# Patient Record
Sex: Male | Born: 1940 | Race: Black or African American | Hispanic: No | Smoking: Never smoker
Health system: Southern US, Community
[De-identification: ages and names within clinical notes are randomized; demographics above are authoritative.]

## PROBLEM LIST (undated history)

## (undated) DIAGNOSIS — C61 Malignant neoplasm of prostate: Secondary | ICD-10-CM

## (undated) DIAGNOSIS — Z923 Personal history of irradiation: Secondary | ICD-10-CM

## (undated) DIAGNOSIS — E669 Obesity, unspecified: Secondary | ICD-10-CM

---

## 1993-09-20 DIAGNOSIS — C61 Malignant neoplasm of prostate: Secondary | ICD-10-CM

## 1993-09-20 HISTORY — DX: Malignant neoplasm of prostate: C61

## 2009-02-18 ENCOUNTER — Emergency Department (HOSPITAL_COMMUNITY): Admission: EM | Admit: 2009-02-18 | Discharge: 2009-02-18 | Payer: Self-pay | Admitting: Emergency Medicine

## 2010-12-28 LAB — URINALYSIS, ROUTINE W REFLEX MICROSCOPIC
Bilirubin Urine: NEGATIVE
Ketones, ur: NEGATIVE mg/dL
Nitrite: NEGATIVE
Protein, ur: NEGATIVE mg/dL
Urobilinogen, UA: 1 mg/dL (ref 0.0–1.0)

## 2010-12-28 LAB — POCT I-STAT, CHEM 8
Calcium, Ion: 1.11 mmol/L — ABNORMAL LOW (ref 1.12–1.32)
Chloride: 106 mEq/L (ref 96–112)
Glucose, Bld: 126 mg/dL — ABNORMAL HIGH (ref 70–99)
HCT: 41 % (ref 39.0–52.0)

## 2011-04-30 ENCOUNTER — Inpatient Hospital Stay (INDEPENDENT_AMBULATORY_CARE_PROVIDER_SITE_OTHER)
Admission: RE | Admit: 2011-04-30 | Discharge: 2011-04-30 | Disposition: A | Discharge status: Home / Self Care | Payer: Medicare HMO | Source: Ambulatory Visit | Attending: Family Medicine | Admitting: Family Medicine

## 2011-04-30 DIAGNOSIS — N400 Enlarged prostate without lower urinary tract symptoms: Secondary | ICD-10-CM

## 2011-04-30 DIAGNOSIS — R319 Hematuria, unspecified: Secondary | ICD-10-CM

## 2011-04-30 LAB — POCT URINALYSIS DIP (DEVICE)
Protein, ur: >=300 mg/dL — AB
Specific Gravity, Urine: 1.02 (ref 1.005–1.030)
Urobilinogen, UA: 1 mg/dL (ref 0.0–1.0)

## 2011-04-30 LAB — PSA: PSA: 13.84 ng/mL — ABNORMAL HIGH (ref <=4.00)

## 2011-06-22 ENCOUNTER — Emergency Department (HOSPITAL_COMMUNITY)
Admission: EM | Admit: 2011-06-22 | Discharge: 2011-06-23 | Disposition: A | Discharge status: Home / Self Care | Payer: Medicare HMO | Attending: Emergency Medicine | Admitting: Emergency Medicine

## 2011-06-22 DIAGNOSIS — R339 Retention of urine, unspecified: Secondary | ICD-10-CM | POA: Insufficient documentation

## 2011-06-22 DIAGNOSIS — R109 Unspecified abdominal pain: Secondary | ICD-10-CM | POA: Insufficient documentation

## 2011-06-22 DIAGNOSIS — R300 Dysuria: Secondary | ICD-10-CM | POA: Insufficient documentation

## 2011-06-22 DIAGNOSIS — R319 Hematuria, unspecified: Secondary | ICD-10-CM | POA: Insufficient documentation

## 2011-06-22 LAB — DIFFERENTIAL
Lymphocytes Relative: 33 % (ref 12–46)
Lymphs Abs: 3 10*3/uL (ref 0.7–4.0)
Neutro Abs: 5.2 10*3/uL (ref 1.7–7.7)
Neutrophils Relative %: 57 % (ref 43–77)

## 2011-06-22 LAB — POCT I-STAT, CHEM 8
Chloride: 104 mEq/L (ref 96–112)
HCT: 37 % — ABNORMAL LOW (ref 39.0–52.0)
Potassium: 4 mEq/L (ref 3.5–5.1)
Sodium: 138 mEq/L (ref 135–145)

## 2011-06-22 LAB — URINALYSIS, ROUTINE W REFLEX MICROSCOPIC
Glucose, UA: 250 mg/dL — AB
Ketones, ur: >80 mg/dL — AB
Nitrite: POSITIVE — AB
pH: 5 (ref 5.0–8.0)

## 2011-06-22 LAB — CBC
HCT: 33.4 % — ABNORMAL LOW (ref 39.0–52.0)
MCV: 64.2 fL — ABNORMAL LOW (ref 78.0–100.0)
RBC: 5.2 MIL/uL (ref 4.22–5.81)
WBC: 9.2 10*3/uL (ref 4.0–10.5)

## 2011-06-22 LAB — URINE MICROSCOPIC-ADD ON

## 2011-06-23 ENCOUNTER — Emergency Department (HOSPITAL_COMMUNITY)
Admission: EM | Admit: 2011-06-23 | Discharge: 2011-06-24 | Disposition: A | Discharge status: Home / Self Care | Payer: Medicare HMO | Attending: Emergency Medicine | Admitting: Emergency Medicine

## 2011-06-23 DIAGNOSIS — R42 Dizziness and giddiness: Secondary | ICD-10-CM | POA: Insufficient documentation

## 2011-06-23 DIAGNOSIS — R300 Dysuria: Secondary | ICD-10-CM | POA: Insufficient documentation

## 2011-06-23 DIAGNOSIS — R319 Hematuria, unspecified: Secondary | ICD-10-CM | POA: Insufficient documentation

## 2011-06-23 LAB — CBC
HCT: 30.4 % — ABNORMAL LOW (ref 39.0–52.0)
Hemoglobin: 10.4 g/dL — ABNORMAL LOW (ref 13.0–17.0)
MCH: 21.8 pg — ABNORMAL LOW (ref 26.0–34.0)
MCHC: 34.2 g/dL (ref 30.0–36.0)
RBC: 4.78 MIL/uL (ref 4.22–5.81)

## 2011-06-25 NOTE — Consult Note (Signed)
NAMEMarland Kitchen  MERCURY, ROCK NO.:  [id number]  MEDICAL RECORD NO.:  [id number]  LOCATION:  [facility]                         FACILITY:  [facility]  PHYSICIAN:  Natalia Leatherwood, MD    DATE OF BIRTH:  09-Oct-1940  DATE OF CONSULTATION: 06/23/2011 DATE OF DISCHARGE:                                CONSULTATION   REQUESTING PROVIDER:  [facility]  CHIEF COMPLAINT:  Gross hematuria.  HISTORY OF PRESENT ILLNESS:  This is a 70 year old gentleman who is a patient of Dr. Ihor Gully with [facility].  The patient has a history of gross hematuria and a very large prostate.  The large prostate is the source of his hematuria.  He also has history of low- grade prostate cancer.  He had never had treatment for the prostate cancer.  He presented to the emergency department overnight with clot retention.  The patient had a 16-French two-way Foley catheter placed and hand irrigated multiple times, but was unable to clear the gross hematuria.  So, Urology was consulted.  I saw the patient and evaluated him.  I exchanged his 16-French catheter for 24-French three-way hematuria catheter with a Coude tip.  This was done using sterile technique.  I irrigated his bladder by hand with 3 liters of sterile water.  I placed 30 mL of sterile water into the balloon and placed it on traction while irrigating.  I was able to irrigate his bladder to be free of clot.  Then, I started continuous bladder irrigation and ran several liters through this.  His urine became completely clear with no trace of gross hematuria.  I was able to turn off the continuous bladder irrigation and it remained light pink color.  PAST MEDICAL HISTORY: 1. Prostate cancer. 2. Gross hematuria.  The patient denies any other medical problems.  MEDICATIONS:  Tamsulosin daily.  ALLERGIES:  No known drug allergies.  SURGERIES:  The patient denies previous surgeries.  REVIEW OF SYSTEMS:  Negative for  chest pain, shortness of breath, nausea, vomiting, dizziness or lightheadedness.  Further 10-point review of systems is negative, except for what is listed in history present illness.  SOCIAL HISTORY:  The patient denies daily alcohol use.  Denies recreational drug use.  FAMILY HISTORY:  Noncontributory.  PHYSICAL EXAMINATION:  GENERAL:  No acute distress.  Awake. ENT:  Normocephalic.  Atraumatic. NECK:  Supple.  No masses. CHEST:  Equal effort bilaterally.  Clear to auscultation bilaterally. CARDIOVASCULAR:  S1 is present.  S2 is present.  Regular rhythm. ABDOMEN:  Soft.  Nontender to palpation.  No guarding. BACK:  No CVA tenderness on the right.  No CVA tenderness on the left. GENITOURINARY:  The patient is uncircumcised.  Foreskin is easily retracted and reduced. SKIN:  Good turgor.  No apparent rash. EXTREMITIES:  No gross deformities of the bilateral upper extremities. No gross deformities of the bilateral lower extremities.  LABORATORY DATA:  The patient has a hemoglobin of 11.  Other laboratories reviewed.  Creatinine is within normal limits.  ASSESSMENT:  This patient with gross hematuria.  PLAN:  The patient has cleared his hematuria. 1. I would recommend continuing Foley catheter placement and  discharged home with his Foley catheter in place. 2. He can follow up with Dr. Ihor Gully  at [facility].  I     will contact Dr. Vernie Ammons to let him know and he can decide if want     to make an appointment. 3. I discussed with the patient starting finasteride 5 mg daily.  We     discussed the risks and benefits of this medication, including     breast tenderness, gynecomastia, and decreased libido.  We also     discussed the results of the PCPT trial and the REDUCE trial.  The     patient voiced understanding and wishes to proceed.          ______________________________ Natalia Leatherwood, MD     DW/MEDQ  D:  06/23/2011  T:  06/23/2011  Job:   [id number]  Electronically Signed by Natalia Leatherwood MD on 06/25/2011 09:06:15 PM

## 2014-04-19 ENCOUNTER — Inpatient Hospital Stay: Admit: 2014-04-19 | Discharge: 2014-04-19 | Disposition: A | Discharge status: Home / Self Care

## 2014-04-19 LAB — CBC WITH AUTO DIFFERENTIAL
Basophils %: 1 % (ref 0–2)
Basophils Absolute: 0.03 E9/L (ref 0.00–0.20)
Eosinophils %: 2 % (ref 0–6)
Eosinophils Absolute: 0.08 E9/L (ref 0.05–0.50)
Hematocrit: 42.6 % (ref 37.0–54.0)
Hemoglobin: 13.3 g/dL (ref 12.5–16.5)
Lymphocytes %: 39 % (ref 20–42)
Lymphocytes Absolute: 1.96 E9/L (ref 1.50–4.00)
MCH: 21.4 pg — ABNORMAL LOW (ref 26.0–35.0)
MCHC: 31.2 % — ABNORMAL LOW (ref 32.0–34.5)
MCV: 68.5 fL — ABNORMAL LOW (ref 80.0–99.9)
MPV: 8.6 fL (ref 7.0–12.0)
Monocytes %: 11 % (ref 2–12)
Monocytes Absolute: 0.56 E9/L (ref 0.10–0.95)
Neutrophils %: 48 % (ref 43–80)
Neutrophils Absolute: 2.4 E9/L (ref 1.80–7.30)
PLATELET SLIDE REVIEW: NORMAL
Platelets: 171 E9/L (ref 130–450)
RBC: 6.22 E12/L — ABNORMAL HIGH (ref 3.80–5.80)
RDW: 17.1 fL — ABNORMAL HIGH (ref 11.5–15.0)
WBC: 5 E9/L (ref 4.5–11.5)

## 2014-04-19 LAB — URINALYSIS
Bilirubin Urine: NEGATIVE
Glucose, Ur: NEGATIVE mg/dL
Ketones, Urine: NEGATIVE mg/dL
Leukocyte Esterase, Urine: NEGATIVE
Nitrite, Urine: NEGATIVE
Protein, UA: 100 mg/dL — AB
Specific Gravity, UA: 1.025 (ref 1.005–1.030)
Urobilinogen, Urine: 1 E.U./dL (ref <2.0)
pH, UA: 6 (ref 5.0–9.0)

## 2014-04-19 LAB — MICROSCOPIC URINALYSIS: RBC, UA: >20 /HPF (ref 0–2)

## 2014-04-19 LAB — BASIC METABOLIC PANEL
Anion Gap: 8 mmol/L (ref 7–16)
BUN: 9 mg/dL (ref 8–23)
CO2: 28 mmol/L (ref 22–29)
Calcium: 8.6 mg/dL (ref 8.6–10.2)
Chloride: 104 mmol/L (ref 98–107)
Creatinine: 1.1 mg/dL (ref 0.7–1.2)
GFR African American: >60
GFR Non-African American: >60 mL/min/{1.73_m2} (ref >=60)
Glucose: 113 mg/dL — ABNORMAL HIGH (ref 74–109)
Potassium: 6.2 mmol/L — ABNORMAL HIGH (ref 3.5–5.0)
Sodium: 140 mmol/L (ref 132–146)

## 2014-04-19 LAB — POTASSIUM: Potassium: 4.7 mmol/L (ref 3.5–5.0)

## 2014-04-19 NOTE — ED Notes (Signed)
Pt states he noticed dark red blood in his urine - blood clots - no trauma or injury     Rahmah Mccamy, RN  04/19/14 1432

## 2014-04-19 NOTE — ED Provider Notes (Signed)
HPI Comments: Patient seen independently by Debbora LacrosseMark Eaven Schwager PA-C.    This is a 73 year old male that presents to emergency department complaining of blood in his urine. He states this has been going on for the last several days. He states passing some clots. He denies any pain at this time. He states about 20 years ago he was diagnosed with prostate cancer and states that he refused any treatment at the time. He states for years he has had no problems. [place].      Review of Systems   Constitutional: Negative for fever and chills.   HENT: Negative for ear pain, sinus pressure and sore throat.    Eyes: Negative for pain, discharge and redness.   Respiratory: Negative for cough, shortness of breath and wheezing.    Cardiovascular: Negative for chest pain.   Gastrointestinal: Negative for nausea, vomiting, abdominal pain and diarrhea.   Genitourinary: Positive for hematuria. Negative for dysuria and frequency.   Musculoskeletal: Negative for back pain and arthralgias.   Skin: Negative for rash and wound.   Neurological: Negative for weakness and headaches.   Hematological: Negative for adenopathy.   All other systems reviewed and are negative.      Physical Exam   Constitutional: He is oriented to person, place, and time. He appears well-developed and well-nourished.   HENT:   Head: Normocephalic and atraumatic.   Right Ear: Hearing and external ear normal.   Left Ear: Hearing and external ear normal.   Nose: Nose normal. Right sinus exhibits no maxillary sinus tenderness and no frontal sinus tenderness. Left sinus exhibits no maxillary sinus tenderness and no frontal sinus tenderness.   Mouth/Throat: Uvula is midline, oropharynx is clear and moist and mucous membranes are normal. No trismus in the jaw. No uvula swelling.   Eyes: Conjunctivae, EOM and lids are normal. Pupils are equal, round, and reactive to light.   Neck: Normal range of motion. Neck supple.   Cardiovascular: Normal  rate, regular rhythm and normal heart sounds.    No murmur heard.  Pulmonary/Chest: Effort normal and breath sounds normal.   Abdominal: Soft. Bowel sounds are normal. There is no tenderness. There is no rigidity, no rebound, no guarding and no CVA tenderness.   Musculoskeletal: He exhibits no edema.   Neurological: He is alert and oriented to person, place, and time. He has normal strength. No cranial nerve deficit or sensory deficit. Coordination and gait normal. GCS eye subscore is 4. GCS verbal subscore is 5. GCS motor subscore is 6.   Skin: Skin is warm and dry. No abrasion and no rash noted.   Nursing note and vitals reviewed.      Procedures    MDM    --------------------------------------------- PAST HISTORY ---------------------------------------------  Past Medical History:  has a past medical history of Cancer (HCC).    Past Surgical History:  has no past surgical history on file.    Social History:  reports that he has never smoked. He does not have any smokeless tobacco history on file. He reports that he drinks alcohol. He reports that he does not use illicit drugs.    Family History: family history is not on file.     The patient???s home medications have been reviewed.    Allergies: Review of patient's allergies indicates no known allergies.    -------------------------------------------------- RESULTS -------------------------------------------------  Results for orders placed during the hospital encounter of 04/19/14   URINALYSIS  Result Value Ref Range    Color, UA RED (*) Straw/Yellow    Clarity, UA CLOUDY (*) Clear    Glucose, Ur Negative  Negative mg/dL    Bilirubin Urine Negative  Negative    Ketones, Urine Negative  Negative mg/dL    Specific Gravity, UA 1.025  1.005 - 1.030    Blood, Urine LARGE (*) Negative    pH, UA 6.0  5.0 - 9.0    Protein, UA 100 (*) Negative mg/dL    Urobilinogen, Urine 1.0  < 2.0 E.U./dL    Nitrite, Urine Negative  Negative    Leukocyte Esterase, Urine Negative   Negative   CBC WITH AUTO DIFFERENTIAL       Result Value Ref Range    WBC 5.0  4.5 - 11.5 E9/L    RBC 6.22 (*) 3.80 - 5.80 E12/L    Hemoglobin 13.3  12.5 - 16.5 g/dL    Hematocrit 16.1  09.6 - 54.0 %    MCV 68.5 (*) 80.0 - 99.9 fL    MCH 21.4 (*) 26.0 - 35.0 pg    MCHC 31.2 (*) 32.0 - 34.5 %    RDW 17.1 (*) 11.5 - 15.0 fL    Platelets 171  130 - 450 E9/L    MPV 8.6  7.0 - 12.0 fL    PLATELET SLIDE REVIEW Normal      Neutrophils Relative 48  43 - 80 %    Lymphocytes Relative 39  20 - 42 %    Monocytes Relative 11  2 - 12 %    Eosinophils Relative Percent 2  0 - 6 %    Basophils Relative 1  0 - 2 %    Neutrophils Absolute 2.40  1.80 - 7.30 E9/L    Lymphocytes Absolute 1.96  1.50 - 4.00 E9/L    Monocytes Absolute 0.56  0.10 - 0.95 E9/L    Eosinophils Absolute 0.08  0.05 - 0.50 E9/L    Basophils Absolute 0.03  0.00 - 0.20 E9/L    Anisocytosis 1+      Hypochromia 1+     BASIC METABOLIC PANEL       Result Value Ref Range    Sodium 140  132 - 146 mmol/L    Potassium 6.2 (*) 3.5 - 5.0 mmol/L    Chloride 104  98 - 107 mmol/L    CO2 28  22 - 29 mmol/L    Anion Gap 8  7 - 16 mmol/L    Glucose 113 (*) 74 - 109 mg/dL    BUN 9  8 - 23 mg/dL    CREATININE 1.1  0.7 - 1.2 mg/dL    GFR Non-African American >60  >=60 mL/min/1.73    GFR African American >60      Calcium 8.6  8.6 - 10.2 mg/dL   MICROSCOPIC URINALYSIS       Result Value Ref Range    WBC, UA NONE  0 - 5 /HPF    RBC, UA >20  0 - 2 /HPF    Epi Cells NONE      Bacteria, UA MODERATE (*)    POTASSIUM       Result Value Ref Range    Potassium 4.7  3.5 - 5.0 mmol/L     CT ABDOMEN PELVIS WO IV CONTRAST    Final Result: IMPRESSION: Significant enlarged prostate with indeterminate     urinary bladder invasion. Intermediate density material within the    urinary bladder  probably is blood given patient's symptoms.       ------------------------- NURSING NOTES AND VITALS REVIEWED ---------------------------   The nursing notes within the ED encounter and vital signs as below have been  reviewed.   BP 164/82    Pulse 61    Temp(Src) 97.7 ??F (36.5 ??C) (Oral)    Resp 18    Ht 5\' 9"  (1.753 m)    Wt 305 lb (138.347 kg)    BMI 45.02 kg/m2      SpO2 96%   Oxygen Saturation Interpretation: Normal      ------------------------------------------ PROGRESS NOTES ------------------------------------------   I have spoken with the patient and discussed today???s results, in addition to providing specific details for the plan of care and counseling regarding the diagnosis and prognosis.  Their questions are answered at this time and they are agreeable with the plan.      --------------------------------- ADDITIONAL PROVIDER NOTES ---------------------------------     I spoke to patient about test results. I spoke to him about the CAT scan test in particular.  Patient states he is going back to [place] in the next several days. I offered to call a local urologist here for him but patient states he is leaving in a few days. He states he is going to call a family Dr. Claiborne Billingshat he is seen in the past in order to get this problem worked up. I told him that it is a high probability that the prostate problem could be cancerous and he states that he will get this checked out once he goes back to [place]. I told him to return the next few days he's having any problems especially with urination.     This patient is stable for discharge.  I have shared the specific conditions for return, as well as the importance of follow-up.      * NOTE: This report was transcribed using voice recognition software. Every effort was made to ensure accuracy; however, inadvertent computerized transcription errors may be present.    --------------------------------- IMPRESSION AND DISPOSITION ---------------------------------    IMPRESSION  1. Hematuria    2. Prostate hypertrophy        DISPOSITION  Disposition: Discharge to home  Patient condition is good          Sherryl BartersMark A Elex Mainwaring, [place], RN  04/19/14 2223

## 2014-04-21 LAB — CULTURE, URINE

## 2014-04-22 ENCOUNTER — Inpatient Hospital Stay
Admit: 2014-04-23 | Disposition: A | Discharge status: Home / Self Care | Source: Home / Self Care | Attending: Emergency Medicine | Admitting: Emergency Medicine

## 2014-04-22 MED ADMIN — 0.9 % sodium chloride bolus: 1000 mL | INTRAVENOUS | NDC 00338004904

## 2014-04-22 NOTE — ED Notes (Signed)
Post void residual 332cc.     Carole Civil, RN  04/22/14 2119

## 2014-04-22 NOTE — ED Notes (Signed)
Alert, oriented x3 resp easy.  Voided 50cc bright red blood.  Urine spec sent to lab.     Carole Civil, RN  04/22/14 2001

## 2014-04-22 NOTE — ED Provider Notes (Signed)
HPI:  04/22/2014,   Time: 7:21 PM         Billy Charles is a 73 y.o. male presenting to the ED for bladder and lower abdominal pain, beginning few days ago.  The complaint has been persistent, moderate in severity, and worsened by urinating.  Patient presents to the emergency room with lower suprapubic pain and spasms. He is also having persistent bloody urine With clots. He was seen here on July 31 with similar symptoms. The patient's problems began 10 years ago when he was diagnosed with prostate cancer. At the time he refused treatment. He came up to the area last week for a family funeral. About mid week last week he started noting some blood in his urine. It got worse and he came to the emergency room to be seen on Friday. At the time his hemoglobin and hematocrit were within normal limits. A CAT scan was done which showed prostate cancer that appeared to have invaded into the bladder wall. The patient at the time was headed back to [place] and was discharged home and advised to follow up in the very near future with a urologist upon his return home. He has not left the area yet because of these persistent symptoms. He comes back to the emergency room reporting that his pain got very severe over the weekend. He describes suprapubic spasms and severe pain it's worse with urinating. The blood and blood clots persist.  He denies any fever or chills or vomiting.  Denies weakness or lightheadedness      ROS:     HEENT:  No HA, ear pain, sore throat or difficulty swallowing  Resp:  Denies SOB, cough or hemoptysis  Cardiac: No CP, LE edema or palpitations  GI:  See HPI  GU:  See HPI  MS:  Moving arms and legs well.  No injury or trauma  Neuro: Denies head injury, headache, syncope or seizure.    Skin:  No rashes or skin lesions      Unless otherwise stated in this report or unable to obtain because of the patient's clinical or mental status as evidenced by the medical record, this patients's positive and  negative responses for Review of Systems, constitutional, psych, eyes, ENT, cardiovascular, respiratory, gastrointestinal, neurological, genitourinary, musculoskeletal, integument systems and systems related to the presenting problem are either stated in the preceding or were not pertinent or were negative for the symptoms and/or complaints related to the medical problem.      --------------------------------------------- PAST HISTORY ---------------------------------------------Past Medical History:  has a past medical history of Cancer (HCC).    Past Surgical History:  has no past surgical history on file.    Social History:  reports that he has never smoked. He does not have any smokeless tobacco history on file. He reports that he drinks alcohol. He reports that he does not use illicit drugs.    Family History: family history is not on file.     The patient???s home medications have been reviewed.    Allergies: Review of patient's allergies indicates no known allergies.    -------------------------------------------------- RESULTS -------------------------------------------------  All laboratory and radiology results have been personally reviewed by myself   LABS:  Results for orders placed during the hospital encounter of 04/22/14   CBC WITH AUTO DIFFERENTIAL       Result Value Ref Range    WBC 6.3  4.5 - 11.5 E9/L    RBC 4.92  3.80 - 5.80 E12/L  Hemoglobin 10.6 (*) 12.5 - 16.5 g/dL    Hematocrit 16.1 (*) 37.0 - 54.0 %    MCV 68.7 (*) 80.0 - 99.9 fL    MCH 21.6 (*) 26.0 - 35.0 pg    MCHC 31.4 (*) 32.0 - 34.5 %    RDW 17.6 (*) 11.5 - 15.0 fL    Platelets 167  130 - 450 E9/L    MPV 8.8  7.0 - 12.0 fL    PLATELET SLIDE REVIEW Normal      Neutrophils Relative 48  43 - 80 %    Lymphocytes Relative 39  20 - 42 %    Monocytes Relative 12  2 - 12 %    Eosinophils Relative Percent 1  0 - 6 %    Basophils Relative 1  0 - 2 %    Neutrophils Absolute 2.98  1.80 - 7.30 E9/L    Lymphocytes Absolute 2.41  1.50 - 4.00 E9/L     Monocytes Absolute 0.77  0.10 - 0.95 E9/L    Eosinophils Absolute 0.05  0.05 - 0.50 E9/L    Basophils Absolute 0.04  0.00 - 0.20 E9/L    Anisocytosis 1+      Hypochromia 2+      Poikilocytes 1+      Target Cells 1+     COMPREHENSIVE METABOLIC PANEL       Result Value Ref Range    Sodium 138  132 - 146 mmol/L    Potassium 5.0  3.5 - 5.0 mmol/L    Chloride 101  98 - 107 mmol/L    CO2 28  22 - 29 mmol/L    Anion Gap 9  7 - 16 mmol/L    Glucose 107  74 - 109 mg/dL    BUN 11  8 - 23 mg/dL    CREATININE 1.2  0.7 - 1.2 mg/dL    GFR Non-African American 59  >=60 mL/min/1.73    GFR African American >60      Calcium 8.6  8.6 - 10.2 mg/dL    Total Protein 6.5  6.4 - 8.3 g/dL    Alb 3.3 (*) 3.5 - 5.2 g/dL    Total Bilirubin 0.6  0.0 - 1.2 mg/dL    Alkaline Phosphatase 37 (*) 40 - 129 U/L    ALT 12  0 - 40 U/L    AST 29  0 - 39 U/L   PROTIME-INR       Result Value Ref Range    Protime 12.7 (*) 9.3 - 12.6 sec    INR 1.0     APTT       Result Value Ref Range    aPTT 26.8  24.5 - 34.6 sec   LACTIC ACID, PLASMA       Result Value Ref Range    Lactic Acid 1.4  0.5 - 2.2 mmol/L   URINALYSIS       Result Value Ref Range    Color, UA RED (*) Straw/Yellow    Clarity, UA TURBID (*) Clear    Glucose, Ur Negative  Negative mg/dL    Bilirubin Urine Negative  Negative    Ketones, Urine Negative  Negative mg/dL    Specific Gravity, UA 1.015  1.005 - 1.030    Blood, Urine LARGE (*) Negative    pH, UA 7.0  5.0 - 9.0    Protein, UA >=300 (*) Negative mg/dL    Urobilinogen, Urine 1.0  < 2.0 E.U./dL  Nitrite, Urine POSITIVE (*) Negative    Leukocyte Esterase, Urine TRACE (*) Negative   MICROSCOPIC URINALYSIS       Result Value Ref Range    WBC, UA 10-20 (*) 0 - 5 /HPF    RBC, UA PACKED  0 - 2 /HPF    Epi Cells RARE      Bacteria, UA RARE (*)        RADIOLOGY:  Interpreted by Radiologist.       ------------------------- NURSING NOTES AND VITALS REVIEWED ---------------------------   The nursing notes within the ED encounter and vital signs as  below have been reviewed.   BP 133/71    Pulse 62    Temp(Src) 97.9 ??F (36.6 ??C) (Oral)    Resp 20    Ht 5\' 9"  (1.753 m)    Wt 305 lb (138.347 kg)    BMI 45.02 kg/m2      SpO2 96%   Oxygen Saturation Interpretation: Normal      ---------------------------------------------------PHYSICAL EXAM--------------------------------------      Constitutional/General: Alert and oriented x3, well appearing, non toxic in NAD  Head: NC/AT  Eyes: PERRL, EOMI  Mouth: Oropharynx clear, handling secretions, no trismus  Neck: Supple, full ROM, no meningeal signs  Pulmonary: Lungs clear to auscultation bilaterally, no wheezes, rales, or rhonchi. Not in respiratory distress  Cardiovascular:  Regular rate and rhythm, no murmurs, gallops, or rubs. 2+ distal pulses  Abdomen: Soft, + BS.  No distension.  Mild suprapubic tenderness.  No palpable rigidity, rebound or guarding  Extremities: Moves all extremities x 4. Warm and well perfused  Skin: warm and dry without rash  Neurologic: GCS 15,  Intact.  No focal deficits  Psych: Normal Affect      ------------------------------ ED COURSE/MEDICAL DECISION MAKING----------------------  Medications   cefTRIAXone (ROCEPHIN) 1 g IVPB in 50 mL D5W minibag (1 g Intravenous New Bag 04/22/14 2108)   0.9 % sodium chloride bolus (1,000 mLs Intravenous New Bag 04/22/14 1953)   morphine injection 5 mg (5 mg Intravenous Given 04/22/14 2106)         Medical Decision Making:    The patient's H&H is down markedly over the past 3 days. He continues to have profuse hematuria with blood clots and now is having some increased bladder pain and spasms. Checking a postvoid residual. He does have positive large blood and some nitrates and leukocytes in his urine. We'll give him a gram of Rocephin here. I discussed the case with Dr. Mean. He liked a PSA level. Patient will be placed on an nothing by mouth status. He'll see the patient in the morning and decide if he needs any procedures. Dr. Luan Pullingunlap is going to be the  admitting physician and is aware and agreeable as well.    Pt has 300 cc PVR but is voiding routinely and showing no signs of obstruction.  He is comfortable at this time.   Hold foley at this time.  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  Discussed with Dr. Magnus IvanSvoboda.      Counseling:   The emergency provider has spoken with the patient and discussed today???s results, in addition to providing specific details for the plan of care and counseling regarding the diagnosis and prognosis.  Questions are answered at this time and they are agreeable with the plan.      --------------------------------- IMPRESSION AND DISPOSITION ---------------------------------    IMPRESSION  1. Hematuria    2. Anemia    3. Prostate  [facility])        DISPOSITION  Disposition: Admit to med/surg floor  Patient condition is stable                      Eartha Inch, PA-C  04/22/14 2044    Eartha Inch, PA-C  04/22/14 2135

## 2014-04-22 NOTE — H&P (Signed)
Name: Billy Charles  DOB: Feb 03, 1941  MRN: [id number]  Room: 25/25  DOS: 04/22/2014  PCP: No primary provider on file.  Admitting Physician: No admitting provider for patient encounter.        Internal Medicine Resident History & Physical    Chief Complaint:  Hematuria    History of Present Illness:    Patient is a 73 year old male who presents with lower abdominal pain and hematuria beginning about 3 days ago. Patient was seen in the ED for similar symptoms at that time. CAT scan done then revealed significantly enlarged prostate with possible bladder invasion. Patient states since then, symptoms have been worsening and he has been having increasing pain. He states he continues to pass blood in his urine and states is old blood and clots. He states he is having increase in frequency. He denies any recent significant weight loss. He states he was diagnosed prostate cancer roughly 20 years ago, which at that time he did not seek treatment. Otherwise, no fever or chills, chest pain, shortness of breath.    Labs:  Lab Results   Component Value Date    WBC 6.3 04/22/2014    HGB 10.6* 04/22/2014    HCT 33.8* 04/22/2014    PLT 167 04/22/2014    NA 138 04/22/2014    K 5.0 04/22/2014    CL 101 04/22/2014    CREATININE 1.2 04/22/2014    BUN 11 04/22/2014    CO2 28 04/22/2014    GLUCOSE 107 04/22/2014    ALT 12 04/22/2014    AST 29 04/22/2014    INR 1.0 04/22/2014     No results found for this basename: CKTOTAL, CKMB, CKMBINDEX, TROPONINI     No results for input(s): BNP in the last 72 hours.      Radiology:  Ct Abdomen Pelvis Wo Iv Contrast    04/19/2014   Reading location: 200   Dictation: Pain, hematuria, history prostate cancer   Findings: Axial noncontrast images were obtained through the abdomen and pelvis per stone protocol.  Due to lack of oral contrast, suboptimal evaluation of the bowel.  Due to lack of IV contrast, suboptimal evaluation of the blood vessels and solid organs.   Symmetric renal size. Renal collecting systems and ureters and urinary  bladder normal caliber. No urinary calculus. Significantly enlarged prostate measuring at least is 65 mm in axial dimensions exhibiting indistinct interface with the urinary bladder. Intermediate density material within the urinary bladder.   Normal sized heart. Small hiatus hernia. Few linear bands of atelectasis or fibrosis in the lung bases.   Within limits of the noncontrast study, other abdominal solid organs unremarkable. Normal caliber bowel and bile ducts. Small umbilical hernia containing mesenteric fat. No abnormally enlarged lymph nodes or evidence of osseous metastasis.      04/19/2014   IMPRESSION: Significant enlarged prostate with indeterminate urinary bladder invasion. Intermediate density material within the urinary bladder probably is blood given patient's symptoms.      Review of Systems:  All bolded are positive, all others are negative.  General:  Fever, chills, diaphoresis, fatigue, malaise, night sweats, weight loss  Psychological:  Anxiety, disorientation, hallucinations.  ENT:  Epistaxis, headaches, vertigo, visual changes.  Cardiovascular:  Chest pain, irregular heartbeats, palpitations, paroxysmal nocturnal dyspnea.  Respiratory:  Shortness of breath, coughing, sputum production, hemoptysis, wheezing, orthopnea.  Gastrointestinal:  Nausea, vomiting, diarrhea, heartburn, constipation, abdominal pain, hematemesis, hematochezia, melena, acholic stools  Genito-Urinary:  Dysuria, urgency, frequency, hematuria  Musculoskeletal:  Joint pain,  joint stiffness, joint swelling, muscle pain  Neurology:  Headache, focal neurological deficits, weakness, numbness, paresthesia  Derm:  Rashes, ulcers, excoriations, bruising  Extremities:  Decreased ROM, peripheral edema, mottling    Past Medical Hx:  Past Medical History   Diagnosis Date   ??? Cancer ([facility])      prostrate-1995       Past Surgical Hx:  History reviewed. No pertinent past surgical history.    Family Hx:  History reviewed. No pertinent family  history.     Home Medications:  Prior to Admission medications    Not on File       Allergies:  Review of patient's allergies indicates no known allergies.    Social Hx:  History     Social History   ??? Marital Status: Single     Spouse Name: N/A     Number of Children: N/A   ??? Years of Education: N/A     Occupational History   ??? Not on file.     Social History Main Topics   ??? Smoking status: Never Smoker    ??? Smokeless tobacco: Not on file   ??? Alcohol Use: Yes      Comment: occ   ??? Drug Use: No   ??? Sexual Activity: Not on file     Other Topics Concern   ??? Not on file     Social History Narrative       Vitals:  BP 133/71    Pulse 62    Temp(Src) 97.9 ??F (36.6 ??C) (Oral)    Resp 20    Ht 5' 9"$  (1.753 m)    Wt 305 lb (138.347 kg)    BMI 45.02 kg/m2      SpO2 96%     Physical Exam:  General: Awake, alert, oriented to person, place, time, and purpose, appears stated age, cooperative, no acute distress   Head: Normocephalic, atraumatic  Eyes: Conjunctivae/corneas clear, Sclera non icteric  Mouth: Mucous membranes moist  Neck: No JVD, no adenopathy, no carotid bruit, neck is supple, trachea is midline  Back: ROM normal, No CVA tenderness.  Lungs: Clear to auscultation bilaterally.  No retractions or use of accessory muscles.  No vocal fremitus. No rhonchi, crackle or rale.  Heart: Regular rate and regular rhythm, no murmur, normal S1, S2  Abdomen: Soft, mild lower abdominal tenderness; bowel sounds normal; no masses, no organomegaly  Extremities: No lower extremity edema, extremities atraumatic, no cyanosis, no clubbing, 2+ pedal pulses palpated  Skin: Normal color, normal texture, normal turgor, no rashes, no lesions  Neurologic: 5/5 muscle strength throughout, Normal muscle tone throughout, PERRLA, EOMI, face symmetric, equal facial muscle strength bilaterally, hearing intact, uvula midline, tongue midline, Speech appropriate without slurring.  Osteopathic/Structural Exam: Examined in seated and supine position. Normal  thoracic kyphosis. Normal lumbar lordosis. No acute changes.      Assessment and Plan     Patient is a 73 y.o. male who presented with hematuria.     Assessment  1. Hematuria with history of prostate cancer.  2. Urinary tract infection    Plan  Urology has been consulted. Patient is to be taken to the OR tomorrow. We'll obtain a post void residual. Pending this result We may place a Foley. Check a chest x-ray to rule out any pathology. Will make patient nothing by mouth. Check routine labs. Please see orders.    This patient was discussed with Dr. Constance Goltz.    Colin Mulders, DO, PGY2  9:12 PM  04/22/2014    The chart was reviewed and the patient was seen and examined.The case was discussed in detail with the resident and agree with current impressions and plan.    Joelyn Oms, D.O.  9:20 AM  04/23/2014

## 2014-04-23 DIAGNOSIS — R319 Hematuria, unspecified: Secondary | ICD-10-CM

## 2014-04-23 DIAGNOSIS — E669 Obesity, unspecified: Secondary | ICD-10-CM | POA: Insufficient documentation

## 2014-04-23 DIAGNOSIS — C7951 Secondary malignant neoplasm of bone: Secondary | ICD-10-CM

## 2014-04-23 DIAGNOSIS — C61 Malignant neoplasm of prostate: Secondary | ICD-10-CM | POA: Insufficient documentation

## 2014-04-23 LAB — CBC WITH AUTO DIFFERENTIAL
Basophils %: 1 % (ref 0–2)
Basophils %: 0 % (ref 0–2)
Basophils Absolute: 0.04 E9/L (ref 0.00–0.20)
Basophils Absolute: 0.02 E9/L (ref 0.00–0.20)
Eosinophils %: 1 % (ref 0–6)
Eosinophils %: 1 % (ref 0–6)
Eosinophils Absolute: 0.05 E9/L (ref 0.05–0.50)
Eosinophils Absolute: 0.08 E9/L (ref 0.05–0.50)
Hematocrit: 33.8 % — ABNORMAL LOW (ref 37.0–54.0)
Hematocrit: 31.3 % — ABNORMAL LOW (ref 37.0–54.0)
Hemoglobin: 10.6 g/dL — ABNORMAL LOW (ref 12.5–16.5)
Hemoglobin: 9.7 g/dL — ABNORMAL LOW (ref 12.5–16.5)
Lymphocytes %: 39 % (ref 20–42)
Lymphocytes %: 42 % (ref 20–42)
Lymphocytes Absolute: 2.41 E9/L (ref 1.50–4.00)
Lymphocytes Absolute: 2.6 E9/L (ref 1.50–4.00)
MCH: 21.6 pg — ABNORMAL LOW (ref 26.0–35.0)
MCH: 21.3 pg — ABNORMAL LOW (ref 26.0–35.0)
MCHC: 31.4 % — ABNORMAL LOW (ref 32.0–34.5)
MCHC: 31 % — ABNORMAL LOW (ref 32.0–34.5)
MCV: 68.7 fL — ABNORMAL LOW (ref 80.0–99.9)
MCV: 68.7 fL — ABNORMAL LOW (ref 80.0–99.9)
MPV: 8.8 fL (ref 7.0–12.0)
MPV: 8.7 fL (ref 7.0–12.0)
Monocytes %: 12 % (ref 2–12)
Monocytes %: 14 % — ABNORMAL HIGH (ref 2–12)
Monocytes Absolute: 0.77 E9/L (ref 0.10–0.95)
Monocytes Absolute: 0.84 E9/L (ref 0.10–0.95)
Neutrophils %: 48 % (ref 43–80)
Neutrophils %: 43 % (ref 43–80)
Neutrophils Absolute: 2.98 E9/L (ref 1.80–7.30)
Neutrophils Absolute: 2.62 E9/L (ref 1.80–7.30)
PLATELET SLIDE REVIEW: NORMAL
PLATELET SLIDE REVIEW: NORMAL
Platelets: 167 E9/L (ref 130–450)
Platelets: 152 E9/L (ref 130–450)
RBC: 4.92 E12/L (ref 3.80–5.80)
RBC: 4.55 E12/L (ref 3.80–5.80)
RDW: 17.6 fL — ABNORMAL HIGH (ref 11.5–15.0)
RDW: 17.2 fL — ABNORMAL HIGH (ref 11.5–15.0)
WBC: 6.3 E9/L (ref 4.5–11.5)
WBC: 6.2 E9/L (ref 4.5–11.5)

## 2014-04-23 LAB — URINALYSIS
Bilirubin Urine: NEGATIVE
Glucose, Ur: NEGATIVE mg/dL
Ketones, Urine: NEGATIVE mg/dL
Nitrite, Urine: POSITIVE — AB
Protein, UA: >=300 mg/dL — AB
Specific Gravity, UA: 1.015 (ref 1.005–1.030)
Urobilinogen, Urine: 1 E.U./dL (ref <2.0)
pH, UA: 7 (ref 5.0–9.0)

## 2014-04-23 LAB — APTT
aPTT: 26.8 s (ref 24.5–34.6)
aPTT: 26.9 s (ref 24.5–34.6)

## 2014-04-23 LAB — PROTIME-INR
INR: 1
INR: 1
Protime: 12.6 s (ref 9.3–12.6)
Protime: 12.7 s — ABNORMAL HIGH (ref 9.3–12.6)

## 2014-04-23 LAB — COMPREHENSIVE METABOLIC PANEL
ALT: 12 U/L (ref 0–40)
ALT: 9 U/L (ref 0–40)
AST: 13 U/L (ref 0–39)
AST: 29 U/L (ref 0–39)
Albumin: 3 g/dL — ABNORMAL LOW (ref 3.5–5.2)
Albumin: 3.3 g/dL — ABNORMAL LOW (ref 3.5–5.2)
Alkaline Phosphatase: 37 U/L — ABNORMAL LOW (ref 40–129)
Alkaline Phosphatase: 40 U/L (ref 40–129)
Anion Gap: 9 mmol/L (ref 7–16)
Anion Gap: 9 mmol/L (ref 7–16)
BUN: 10 mg/dL (ref 8–23)
BUN: 11 mg/dL (ref 8–23)
CO2: 25 mmol/L (ref 22–29)
CO2: 28 mmol/L (ref 22–29)
Calcium: 8.3 mg/dL — ABNORMAL LOW (ref 8.6–10.2)
Calcium: 8.6 mg/dL (ref 8.6–10.2)
Chloride: 101 mmol/L (ref 98–107)
Chloride: 105 mmol/L (ref 98–107)
Creatinine: 1.1 mg/dL (ref 0.7–1.2)
Creatinine: 1.2 mg/dL (ref 0.7–1.2)
GFR African American: >60
GFR African American: >60
GFR Non-African American: 59 mL/min/{1.73_m2} (ref >=60)
GFR Non-African American: >60 mL/min/{1.73_m2} (ref >=60)
Glucose: 102 mg/dL (ref 74–109)
Glucose: 107 mg/dL (ref 74–109)
Potassium: 3.7 mmol/L (ref 3.5–5.0)
Potassium: 5 mmol/L (ref 3.5–5.0)
Sodium: 138 mmol/L (ref 132–146)
Sodium: 139 mmol/L (ref 132–146)
Total Bilirubin: 0.3 mg/dL (ref 0.0–1.2)
Total Bilirubin: 0.6 mg/dL (ref 0.0–1.2)
Total Protein: 5.9 g/dL — ABNORMAL LOW (ref 6.4–8.3)
Total Protein: 6.5 g/dL (ref 6.4–8.3)

## 2014-04-23 LAB — MICROSCOPIC URINALYSIS

## 2014-04-23 LAB — LACTIC ACID: Lactic Acid: 1.4 mmol/L (ref 0.5–2.2)

## 2014-04-23 MED ORDER — ACETAMINOPHEN 325 MG PO TABS
325 | ORAL | Status: DC | PRN
Start: 2014-04-23 — End: 2014-04-24

## 2014-04-23 MED ORDER — HYDROCODONE-ACETAMINOPHEN 5-325 MG PO TABS
5-325 | Freq: Four times a day (QID) | ORAL | Status: DC | PRN
Start: 2014-04-23 — End: 2014-04-24

## 2014-04-23 MED ORDER — ACETAMINOPHEN 325 MG PO TABS
325 | ORAL | Status: DC | PRN
Start: 2014-04-23 — End: 2014-04-22

## 2014-04-23 MED ORDER — OXYCODONE-ACETAMINOPHEN 5-325 MG PO TABS
5-325 | ORAL | Status: DC | PRN
Start: 2014-04-23 — End: 2014-04-24
  Administered 2014-04-23: 22:00:00 1 via ORAL

## 2014-04-23 MED ADMIN — Iothalamate Meglumine (CYSTO-CONRAY II) 17.2 % injection 250 mL: 250 mL | @ 19:00:00 | NDC 00019086209

## 2014-04-23 MED ADMIN — lactated ringers infusion: INTRAVENOUS | @ 22:00:00 | NDC 00338011704

## 2014-04-23 MED ADMIN — sodium chloride flush 0.9 % injection 10 mL: 10 mL | INTRAVENOUS | @ 16:00:00

## 2014-04-23 MED ADMIN — morphine injection 5 mg: 5 mg | INTRAVENOUS | @ 01:00:00 | NDC 00641607301

## 2014-04-23 MED ADMIN — cefTRIAXone (ROCEPHIN) 1 g IVPB in 50 mL D5W minibag: 1 g | INTRAVENOUS | @ 01:00:00 | NDC 25021010610

## 2014-04-23 MED FILL — FENTANYL CITRATE 0.05 MG/ML IJ SOLN: 0.05 MG/ML | INTRAMUSCULAR | Qty: 5

## 2014-04-23 MED FILL — CEFTRIAXONE SODIUM 1 G IJ SOLR: 1 g | INTRAMUSCULAR | Qty: 1

## 2014-04-23 MED FILL — DIPRIVAN 10 MG/ML IV EMUL: 10 MG/ML | INTRAVENOUS | Qty: 20

## 2014-04-23 MED FILL — MORPHINE SULFATE 5 MG/ML IJ SOLN: 5 mg/mL | INTRAMUSCULAR | Qty: 1

## 2014-04-23 MED FILL — NORMAL SALINE FLUSH 0.9 % IV SOLN: 0.9 % | INTRAVENOUS | Qty: 10

## 2014-04-23 MED FILL — OXYCODONE-ACETAMINOPHEN 5-325 MG PO TABS: 5-325 MG | ORAL | Qty: 1

## 2014-04-23 MED FILL — SOJOURN IN SOLN: RESPIRATORY_TRACT | Qty: 250

## 2014-04-23 NOTE — Op Note (Signed)
KOBEY, SIDES                                   [id number]      DATE OF PROCEDURE:  04/23/2014      SURGEON:  Jacki Cones, M.D.      ASSISTANT:      PREOPERATIVE DIAGNOSIS:  Hematuria.      POSTOPERATIVE DIAGNOSIS:  Hematuria secondary to prostatic fossa bleeding.      OPERATION:  Cystopanendoscopy, evacuation of clots, fulguration of prostatic   bleeding.      ANESTHESIA:  General.      ESTIMATED BLOOD LOSS:      COMPLICATIONS:      PROCEDURE:   With the patient in the lithotomy position, under satisfactory   general anesthesia, the genitalia were prepped with Betadine and draped in a   sterile fashion.      A 23-French panendoscope passed easily through the pendulous and membranous   urethra.  There were no strictures or lesions seen.  The prostatic fossa   showed large lateral lobes; however, the median lobe was extremely elongated   and I could barely get to the top of the median lobe which extended well into   the bladder and was probably the size of a baseball.  The median lobe   occupied almost the entire bladder.  Once I was able to get alongside the   median lobe, I was able to aspirate clots.  I also did a cystogram which   verified the capacity of the bladder as well as the large median lobe.  I   then reinserted the resectoscope and cauterized as well as possible.  The   patient's median lobe borders on not being of a resectable nature.  It more   than likely will require either robotic or an open procedure.  The patient is   from [place] here on vacation, and we will discuss various options   with him.  He may prefer to travel back to [place] with the Foley in   place.            Dictated by:  Bryan Lemma Markeem Noreen, M.D.               Macarthur Critchley / nmt/psk   DD: 04/23/2014   03:35 P    DT: 04/23/2014 07:56 P   [id number]    [id number]   CC:  Farrel Gobble, D.O.        Jacki Cones, M.D.

## 2014-04-23 NOTE — Progress Notes (Signed)
[facility] patient is sleeping but awakens easily

## 2014-04-23 NOTE — Consults (Signed)
CONSULT DICTATED          Bryan Lemmaobert R. Burnis Kaser, M.D.  04/23/2014  11:51 AM CONSULT DICTATED          Bryan Lemmaobert R. Juvon Teater, M.D.  04/23/2014  11:51 AM

## 2014-04-23 NOTE — Anesthesia Post-Procedure Evaluation (Signed)
Department of Anesthesiology  Post-Anesthesia Note    Name:  Billy DownerJohn Nilsson                                         Age:  73 y.o.  MRN:  [id number]     Last Vitals:  BP 130/66   Pulse 66   Temp(Src) 97.7 F (36.5 C) (Oral)   Resp 18   Ht 5\' 9"  (1.753 m)   Wt 305 lb (138.347 kg)   BMI 45.02 kg/m2     SpO2 96%   Patient Vitals for the past 4 hrs:   BP Temp Temp src Pulse Resp SpO2   04/23/14 1622 130/66 mmHg 97.7 F (36.5 C) Oral 66 18 96 %   04/23/14 1610 148/73 mmHg - - - - 96 %   04/23/14 1600 138/78 mmHg - - 66 12 98 %   04/23/14 1555 - 97.7 F (36.5 C) Temporal - - 97 %   04/23/14 1550 144/75 mmHg - - 76 13 96 %   04/23/14 1540 147/80 mmHg - - 84 20 97 %   04/23/14 1530 147/80 mmHg 98.4 F (36.9 C) Temporal 85 20 100 %       Level of Consciousness:  Awake    Respiratory:  Stable    Oxygen Saturation:  Stable    Cardiovascular:  Stable    Hydration:  Adequate    PONV:  Stable    Post-op Pain:  Adequate analgesia    Post-op Assessment:  No apparent anesthetic complications    Additional Follow-Up / Treatment / Comment:  None    Myriam ForehandVictor C Tabatha Razzano, MD  April 23, 2014   5:44 PM

## 2014-04-23 NOTE — Consults (Signed)
Billy DownerRICE, Sherard                                   [id number]      DATE OF CONSULTATION:  04/23/2014      CONSULTANT:  Jacki ConesOBERT R. Lian Tanori, M.D.      CHIEF COMPLAINT:  Hematuria and difficulty voiding.      HISTORY OF PRESENT ILLNESS:  This 73 year old male who, in about 1995, was   diagnosed with prostate cancer.  The patient elected to not received formal   treatment.  [place] and has not been seen   since.  The patient is in the area now, visiting, and began having gross   hematuria with clots and some difficulty voiding.  He was admitted to the   hospital.  A CAT scan showed that he has significant prostate enlargement and   some suggestion of bladder invasion.  The patient is otherwise in good   general health.      PAST MEDICAL HISTORY:  His past medical history is essentially unremarkable   with the exception of obesity.      FAMILY HISTORY:  Family history is negative for any genitourinary   malignancies.      MEDICATIONS:  His medications currently are Rocephin, Zofran.      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  He has never been a cigarette smoker.  [place]  He is married.      PHYSICAL EXAMINATION:  The patient is an alert, oriented, very large male who   does not really appear to be in discomfort.  Head, ears, eyes, nose, and   throat unremarkable.  His abdomen is soft.  There is some tenderness in the   suprapubic area.  Penis, testes are normal.      ASSESSMENT:   1.    Gross hematuria with clots.   2.    History of prostate cancer.  PSA is pending at this time.         RECOMMENDATIONS:  The patient will undergo cystoscopy, clot evacuation and   fulguration of his bleeding.            Dictated by:  Bryan Lemmaobert R. Nyal Schachter, M.D.      Macarthur CritchleyR / nmt/psk   DD:  04/23/2014   11:55 A    DT:  04/23/2014 04:13 P   [id number]    [id number]   CC:   Farrel GobbleUGLAS A. DUNLAP, D.O.         Jacki ConesOBERT R. Jannah Guardiola, M.D.

## 2014-04-23 NOTE — H&P (Addendum)
Name: Billy Charles  DOB: December 02, 1940  MRN: [id number]  Room: 0314/0314-01  DOS: 04/23/2014  PCP: No primary provider on file.  Admitting Physician: Farrel Gobble, DO        Internal Medicine Resident History & Physical    Chief Complaint:  Hematuria    History of Present Illness:    Patient is a 73 year old male who presents with lower abdominal pain and hematuria beginning about 3 days ago. Patient was seen in the ED for similar symptoms at that time. CAT scan done then revealed significantly enlarged prostate with possible bladder invasion. Patient states since then, symptoms have been worsening and he has been having increasing pain. He states he continues to pass blood in his urine and states is old blood and clots. He states he is having increase in frequency. He denies any recent significant weight loss. He states he was diagnosed prostate cancer roughly 20 years ago, which at that time he did not seek treatment. Otherwise, no fever or chills, chest pain, shortness of breath.    Labs:  Lab Results   Component Value Date    WBC 6.2 04/23/2014    HGB 9.7* 04/23/2014    HCT 31.3* 04/23/2014    PLT 152 04/23/2014    NA 139 04/23/2014    K 3.7 04/23/2014    CL 105 04/23/2014    CREATININE 1.1 04/23/2014    BUN 10 04/23/2014    CO2 25 04/23/2014    GLUCOSE 102 04/23/2014    ALT 9 04/23/2014    AST 13 04/23/2014    INR 1.0 04/23/2014     No results found for this basename: CKTOTAL,  CKMB,  CKMBINDEX,  TROPONINI     No results for input(s): BNP in the last 72 hours.      Radiology:  Ct Abdomen Pelvis Wo Iv Contrast    04/19/2014   Reading location: 200   Dictation: Pain, hematuria, history prostate cancer   Findings: Axial noncontrast images were obtained through the abdomen and pelvis per stone protocol.  Due to lack of oral contrast, suboptimal evaluation of the bowel.  Due to lack of IV contrast, suboptimal evaluation of the blood vessels and solid organs.   Symmetric renal size. Renal collecting systems and ureters and urinary bladder normal  caliber. No urinary calculus. Significantly enlarged prostate measuring at least is 65 mm in axial dimensions exhibiting indistinct interface with the urinary bladder. Intermediate density material within the urinary bladder.   Normal sized heart. Small hiatus hernia. Few linear bands of atelectasis or fibrosis in the lung bases.   Within limits of the noncontrast study, other abdominal solid organs unremarkable. Normal caliber bowel and bile ducts. Small umbilical hernia containing mesenteric fat. No abnormally enlarged lymph nodes or evidence of osseous metastasis.      04/19/2014   IMPRESSION: Significant enlarged prostate with indeterminate urinary bladder invasion. Intermediate density material within the urinary bladder probably is blood given patient's symptoms.      Review of Systems:  All bolded are positive, all others are negative.  General:  Fever, chills, diaphoresis, fatigue, malaise, night sweats, weight loss  Psychological:  Anxiety, disorientation, hallucinations.  ENT:  Epistaxis, headaches, vertigo, visual changes.  Cardiovascular:  Chest pain, irregular heartbeats, palpitations, paroxysmal nocturnal dyspnea.  Respiratory:  Shortness of breath, coughing, sputum production, hemoptysis, wheezing, orthopnea.  Gastrointestinal:  Nausea, vomiting, diarrhea, heartburn, constipation, abdominal pain, hematemesis, hematochezia, melena, acholic stools  Genito-Urinary:  Dysuria, urgency, frequency, hematuria  Musculoskeletal:  Joint  pain, joint stiffness, joint swelling, muscle pain  Neurology:  Headache, focal neurological deficits, weakness, numbness, paresthesia  Derm:  Rashes, ulcers, excoriations, bruising  Extremities:  Decreased ROM, peripheral edema, mottling    Past Medical Hx:  Past Medical History   Diagnosis Date   ??? Cancer ([facility])      prostrate-1995       Past Surgical Hx:  History reviewed. No pertinent past surgical history.    Family Hx:  History reviewed. No pertinent family history.     Home  Medications:  Prior to Admission medications    Not on File       Allergies:  Review of patient's allergies indicates no known allergies.    Social Hx:  History     Social History   ??? Marital Status: Single     Spouse Name: N/A     Number of Children: N/A   ??? Years of Education: N/A     Occupational History   ??? Not on file.     Social History Main Topics   ??? Smoking status: Never Smoker    ??? Smokeless tobacco: Not on file   ??? Alcohol Use: Yes      Comment: occ   ??? Drug Use: No   ??? Sexual Activity: Not on file     Other Topics Concern   ??? Not on file     Social History Narrative       Vitals:  BP 142/66    Pulse 68    Temp(Src) 97.9 ??F (36.6 ??C) (Oral)    Resp 16    Ht 5\' 9"  (1.753 m)    Wt 305 lb (138.347 kg)    BMI 45.02 kg/m2      SpO2 98%     Physical Exam:  General: Awake, alert, oriented to person, place, time, and purpose, appears stated age, cooperative, no acute distress   Head: Normocephalic, atraumatic  Eyes: Conjunctivae/corneas clear, Sclera non icteric  Mouth: Mucous membranes moist  Neck: No JVD, no adenopathy, no carotid bruit, neck is supple, trachea is midline  Back: ROM normal, No CVA tenderness.  Lungs: Clear to auscultation bilaterally.  No retractions or use of accessory muscles.  No vocal fremitus. No rhonchi, crackle or rale.  Heart: Regular rate and regular rhythm, no murmur, normal S1, S2  Abdomen: Soft, mild lower abdominal tenderness; bowel sounds normal; no masses, no organomegaly  Extremities: No lower extremity edema, extremities atraumatic, no cyanosis, no clubbing, 2+ pedal pulses palpated  Skin: Normal color, normal texture, normal turgor, no rashes, no lesions  Neurologic: 5/5 muscle strength throughout, Normal muscle tone throughout, PERRLA, EOMI, face symmetric, equal facial muscle strength bilaterally, hearing intact, uvula midline, tongue midline, Speech appropriate without slurring.  Osteopathic/Structural Exam: Examined in seated and supine position. Normal thoracic kyphosis.  Normal lumbar lordosis. No acute changes.      Assessment and Plan     Patient is a 73 y.o. male who presented with hematuria.     Assessment  1. Hematuria with history of prostate cancer.  2. Urinary tract infection  3. Obesity  4. Hematuria  5. ? New onset HTN    Plan  Urology has been consulted. Patient is to be taken to the OR tomorrow. We'll obtain a post void residual. Pending this result We may place a Foley. Check a chest x-ray to rule out any pathology. Will make patient nothing by mouth. Check routine labs. Please see orders.    This patient  was discussed with Dr. Luan Pulling.    Charlesetta Garibaldi, DO, PGY2  9:21 AM  04/23/2014    The chart was reviewed and the patient was seen and examined.The case was discussed in detail with the resident and agree with current impressions and plan. Pt is set up for cystoscopy today. No new complaints. P.E.  Cor-rrr,;  Lung-ess cta,;  abd- + bs,soft, no mass,;  Ext-no pte.    Farrel Gobble, D.O.  9:21 AM  04/23/2014

## 2014-04-23 NOTE — Plan of Care (Signed)
Problem: Anxiety:  Goal: Level of anxiety will decrease  Level of anxiety will decrease  Outcome: Ongoing

## 2014-04-23 NOTE — Brief Op Note (Signed)
Brief Postoperative Note    Billy Charles  Date of Birth:  1941/05/11  [id number]    Pre-operative Diagnosis: hematuria    Post-operative Diagnosis: Same    Procedure: cysto evac clots fulguration    Anesthesia: General    Surgeons/Assistants: Bryan Lemma Naythen Heikkila      Estimated Blood Loss: 50    Complications: None    Specimens: Was Not Obtained    Findings:     Electronically signed by Jacki Cones, MD on 04/23/2014 at 3:22 PM

## 2014-04-23 NOTE — Plan of Care (Signed)
Problem: Pain:  Goal: Control of acute pain  Control of acute pain  Outcome: Ongoing

## 2014-04-23 NOTE — Anesthesia Pre-Procedure Evaluation (Signed)
Billy Charles         Department of Anesthesiology  Pre-Anesthesia Evaluation/Consultation       Name:  Billy Charles                                         Age:  73 y.o.  MRN:  [id number]           Procedure (Scheduled):  Cysto, evacuation clots, possible fulguration  Surgeon:  Dr. Gus Height     No Known Allergies  Patient Active Problem List   Diagnosis Code   . Prostate ca (HCC) 185   . Hematuria 599.70   . Obesity, unspecified 278.00     Past Medical History   Diagnosis Date   . 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 No pertinent past surgical history.  History   Substance Use Topics   . Smoking status: Never Smoker    . Smokeless tobacco: Not on file   . Alcohol Use: Yes      Comment: occ     Medications  No current facility-administered medications on file prior to encounter.     No current outpatient prescriptions on file prior to encounter.     Current Facility-Administered Medications   Medication Dose Route Frequency Provider Last Rate Last Dose   . [MAR Hold] HYDROcodone-acetaminophen (NORCO) 5-325 MG per tablet 1 tablet  1 tablet Oral Q6H PRN Farrel Gobble, DO       . [MAR Hold] sodium chloride flush 0.9 % injection 10 mL  10 mL Intravenous PRN Charlesetta Garibaldi, DO       . [MAR Hold] acetaminophen (TYLENOL) tablet 650 mg  650 mg Oral Q4H PRN Charlesetta Garibaldi, DO       . Kei.Heading Hold] magnesium hydroxide (MILK OF MAGNESIA) 400 MG/5ML suspension 30 mL  30 mL Oral Daily PRN Charlesetta Garibaldi, DO       . [MAR Hold] ondansetron (ZOFRAN) injection 4 mg  4 mg Intravenous Q6H PRN Charlesetta Garibaldi, DO       . Kei.Heading Hold] cefTRIAXone (ROCEPHIN) 1 g IVPB in 50 mL D5W minibag  1 g Intravenous Q24H Charlesetta Garibaldi, DO       . [MAR Hold] sodium chloride flush 0.9 % injection 10 mL  10 mL Intravenous Q12H SCH Cindy A Lanterman, PA-C   10 mL at 04/23/14 1136     Vital Signs (Current)   Filed Vitals:    04/23/14 0755   BP: 142/66   Pulse: 68   Temp: 97.9 F (36.6 C)   Resp: 16     Vital Signs Statistics (for past 48 hrs)     BP  Min: 133/71    Min taken time: 04/22/14 1958  Max: 151/75   Max taken time: 04/22/14 2229  Temp  Avg: 98.2 F (36.8 C)  Min: 97.9 F (36.6 C)   Min taken time: 04/23/14 0755  Max: 98.7 F (37.1 C)   Max taken time: 04/22/14 2229  Pulse  Avg: 68.6  Min: 62   Min taken time: 04/22/14 1958  Max: 78   Max taken time: 04/22/14 2229  Resp  Avg: 19.6  Min: 16   Min taken time: 04/23/14 0755  Max: 22   Max taken time: 04/22/14 1645  SpO2  Avg: 96.8 %  Min: 94 %   Min taken time:  04/22/14 2229  Max: 98 %   Max taken time: 04/23/14 0740    BP Readings from Last 3 Encounters:   04/23/14 142/66   04/19/14 164/82     BMI  Body mass index is 45.02 kg/(m^2).  Estimated body mass index is 45.02 kg/(m^2) as calculated from the following:    Height as of this encounter: 5\' 9"  (1.753 m).    Weight as of this encounter: 305 lb (138.347 kg).    CBC   Lab Results   Component Value Date    WBC 6.2 04/23/2014    RBC 4.55 04/23/2014    HGB 9.7 04/23/2014    HCT 31.3 04/23/2014    MCV 68.7 04/23/2014    RDW 17.2 04/23/2014    PLT 152 04/23/2014     CMP    Lab Results   Component Value Date    NA 139 04/23/2014    K 3.7 04/23/2014    CL 105 04/23/2014    CO2 25 04/23/2014    BUN 10 04/23/2014    CREATININE 1.1 04/23/2014    GFRAA >60 04/23/2014    LABGLOM >60 04/23/2014    GLUCOSE 102 04/23/2014    PROT 5.9 04/23/2014    CALCIUM 8.3 04/23/2014    BILITOT 0.3 04/23/2014    ALKPHOS 40 04/23/2014    AST 13 04/23/2014    ALT 9 04/23/2014     BMP    Lab Results   Component Value Date    NA 139 04/23/2014    K 3.7 04/23/2014    CL 105 04/23/2014    CO2 25 04/23/2014    BUN 10 04/23/2014    CREATININE 1.1 04/23/2014    CALCIUM 8.3 04/23/2014    GFRAA >60 04/23/2014    LABGLOM >60 04/23/2014    GLUCOSE 102 04/23/2014     POCGlucose  Recent Labs      04/22/14   1950  04/23/14   0356   GLUCOSE  107  102      Coags    Lab Results   Component Value Date    PROTIME 12.6 04/23/2014    INR 1.0 04/23/2014    APTT 26.9 04/23/2014     HCG (If Applicable)   No results found for this basename: PREGTESTUR,  PREGSERUM,  HCG,  HCGQUANT       ABGs   No results found for this basename: PHART,  PO2ART,  PCO2ART,  HCO3ART,  BEART,  O2SATART      Type & Screen (If Applicable)  No results found for this basename: aborh     Radiology (If Applicable)  Cardiac Testing (If Applicable)   EKG (If Applicable)        Anesthesia Evaluation     Patient summary reviewed    Airway   Mallampati: III  TM distance: >3 FB  Dental        Pulmonary - negative ROS    breath sounds clear to auscultation  Cardiovascular   (-) dysrhythmias, angina    Rhythm: regular  Rate: normal  Beta Blocker:  Not on Beta Blocker    Neuro/Psych - negative ROS   GI/Hepatic/Renal      Comments: Prostate ca    hematuria    Endo/Other      Comments: obesity  Abdominal   (+) obese,        Other findings: Several chipped teeth           Allergies: Review of patient's allergies indicates  no known allergies.    NPO Status: Time of last liquid consumption: 0000                       Time of last solid food consumption: 0000            Date of last liquid consumption: 04/23/14            Date of last solid food consumption: 04/23/14    Anesthesia Plan    ASA 3     MAC   (LMAC, GA if necessary)  intravenous induction   Anesthetic plan and risks discussed with patient.    Plan discussed with CRNA.          Myriam ForehandVictor C Leahmarie Gasiorowski, MD  04/23/2014

## 2014-04-23 NOTE — Progress Notes (Signed)
Patient received folder, rights and responsibilities reviewed. Billy Charles  04/23/2014  6:17 PM

## 2014-04-23 NOTE — Progress Notes (Signed)
[facility] Quality Flow/Interdisciplinary Rounds Progress Note        Quality Flow Rounds held on April 23, 2014    Disciplines Attending:  Bedside Nurse, Social Worker, Case Manager and Nursing Unit Leadership    Billy Charles was admitted on 04/22/2014  8:43 PM    Anticipated Discharge Date:  Expected Discharge Date: 04/25/14    Disposition: home    Braden Score:  Braden Scale Score: 22    Readmission Risk           Readmission Risk:       2.25       Age 72 or Greater: 1    Admitted from SNF or Requires Paid or Family Care: 0    Currently has CHF,COPD,ARF,CRI,or is on dialysis: 0    Takes more than 5 Prescription Medications: 0    Takes Digoxin,Insulin,Anticoagulants,Narcotics or ASA/Plavix: 0    Hospital Admit in Past 12 Months: 0    On Disability: 0    Patient Considers own Health: 1.25          Discussed patient goal for the day, patient clinical progression, and barriers to discharge.  The following Goal(s) of the Day/Commitment(s) have been identified:  Cysto planned for today      [facility] Geraldy Charles  April 23, 2014

## 2014-04-24 LAB — BASIC METABOLIC PANEL
Anion Gap: 11 mmol/L (ref 7–16)
BUN: 7 mg/dL — ABNORMAL LOW (ref 8–23)
CO2: 24 mmol/L (ref 22–29)
Calcium: 8.2 mg/dL — ABNORMAL LOW (ref 8.6–10.2)
Chloride: 103 mmol/L (ref 98–107)
Creatinine: 1.1 mg/dL (ref 0.7–1.2)
GFR African American: >60
GFR Non-African American: >60 mL/min/{1.73_m2} (ref >=60)
Glucose: 111 mg/dL — ABNORMAL HIGH (ref 74–109)
Potassium: 4 mmol/L (ref 3.5–5.0)
Sodium: 138 mmol/L (ref 132–146)

## 2014-04-24 LAB — CBC
Hematocrit: 31.7 % — ABNORMAL LOW (ref 37.0–54.0)
Hemoglobin: 10 g/dL — ABNORMAL LOW (ref 12.5–16.5)
MCH: 21.7 pg — ABNORMAL LOW (ref 26.0–35.0)
MCHC: 31.5 % — ABNORMAL LOW (ref 32.0–34.5)
MCV: 68.9 fL — ABNORMAL LOW (ref 80.0–99.9)
MPV: 8 fL (ref 7.0–12.0)
Platelets: 154 E9/L (ref 130–450)
RBC: 4.6 E12/L (ref 3.80–5.80)
RDW: 17.1 fL — ABNORMAL HIGH (ref 11.5–15.0)
WBC: 8.8 E9/L (ref 4.5–11.5)

## 2014-04-24 MED ORDER — TAMSULOSIN HCL 0.4 MG PO CAPS
0.4 MG | ORAL_CAPSULE | Freq: Every day | ORAL | Status: AC
Start: 2014-04-24 — End: 2015-04-24

## 2014-04-24 MED ORDER — OXYCODONE-ACETAMINOPHEN 5-325 MG PO TABS
5-325 MG | ORAL_TABLET | ORAL | Status: AC | PRN
Start: 2014-04-24 — End: 2014-05-01

## 2014-04-24 MED ORDER — CEPHALEXIN 500 MG PO CAPS
500 MG | ORAL_CAPSULE | Freq: Three times a day (TID) | ORAL | Status: AC
Start: 2014-04-24 — End: 2014-05-01

## 2014-04-24 MED ADMIN — lactated ringers infusion: INTRAVENOUS | @ 12:00:00 | NDC 00338011704

## 2014-04-24 MED ADMIN — lactated ringers infusion: INTRAVENOUS | @ 04:00:00 | NDC 00338011704

## 2014-04-24 MED FILL — CEFTRIAXONE SODIUM 1 G IJ SOLR: 1 g | INTRAMUSCULAR | Qty: 1

## 2014-04-24 NOTE — Discharge Instructions (Addendum)
You may see blood in urine/you may have burning with urination  Call if severe abdominal pain or difficulty voiding  You may drive  5 days  You may bathe tomorrow  You may go up and down stairs  No lifting more than twenty pounds for a week  See a Urologist when you return home  Keep bowels soft with prune juice of Milk of Magnesia  Your information:  Name: Billy Charles  DOB: [birth year]    Your instructions:    As above per Dr Maryann Alar    What to do after you leave the hospital:    Recommended diet: regular diet    Recommended activity: activity as tolerated    If you experience any of the following symptoms inability to urinatr, signs of infection, fever, chills, please follow up with Primary care Dr   at home      The following personal items were collected during your admission and were returned to you:    Valuables  Dentures: None  Vision - Corrective Lenses: Glasses  Hearing Aid: None  Jewelry: None  Body Piercings Removed: N/A  Clothing: Pants, Shirt, Socks, Undergarments (Comment), Footwear  Were All Patient Medications Collected?: Not Applicable  Other Valuables: None  Valuables Given To: Patient  Provide Name(s) of Who Valuable(s) Were Given To: n/a  Responsible person(s) in the waiting room?: n/a    Information obtained by:  By signing below, I understand that if any problems occur once I leave the hospital I am to contact Primary care doctor   I understand and acknowledge receipt of the instructions indicated above.   Cystoscopy: What to Expect at [facility]     A cystoscopy is a procedure that lets a doctor look inside of the bladder and the urethra. The urethra is the tube that carries urine from the bladder to outside the body. The doctor uses a thin, lighted tube called a cystoscope to look for kidney or bladder stones, tumors, bleeding, or infection. After the cystoscopy, your urethra may be sore at first, and it may burn when you urinate for the first few days after the procedure. You may feel the  need to urinate more often, and your urine may be pink. These symptoms should get better in 1 or 2 days. You will probably be able to go back to work or most of your usual activities in 1 or 2 days.  This care sheet gives you a general idea about how long it will take for you to recover. But each person recovers at a different pace. Follow the steps below to get better as quickly as possible.  How can you care for yourself at home?  Activity   Rest when you feel tired. Getting enough sleep will help you recover.   Try to walk each day. Start by walking a little more than you did the day before. Bit by bit, increase the amount you walk. Walking boosts blood flow and helps prevent pneumonia and constipation.   Avoid strenuous activities, such as bicycle riding, jogging, weight lifting, or aerobic exercise, until your doctor says it is okay.   Ask your doctor when you can drive again.   Most people are able to return to work within 1 or 2 days after the procedure.   You may shower and take baths as usual.   Ask your doctor when it is okay for you to have sex.  Diet   You can eat your normal diet.  If your stomach is upset, try bland, low-fat foods like plain rice, broiled chicken, toast, and yogurt.   Drink plenty of fluids (unless your doctor tells you not to).  Medicines   Take pain medicines exactly as directed.   If the doctor gave you a prescription medicine for pain, take it as prescribed.   If you are not taking a prescription pain medicine, ask your doctor if you can take an over-the-counter medicine.   If you think your pain medicine is making you sick to your stomach:   Take your medicine after meals (unless your doctor has told you not to).   Ask your doctor for a different pain medicine.   If your doctor prescribed antibiotics, take them as directed. Do not stop taking them just because you feel better. You need to take the full course of antibiotics.  Follow-up care is a key part of your  treatment and safety. Be sure to make and go to all appointments, and call your doctor if you are having problems. It's also a good idea to know your test results and keep a list of the medicines you take.  When should you call for help?  Call 911 anytime you think you may need emergency care. For example, call if:   You passed out (lost consciousness).   You have severe trouble breathing.   You have sudden chest pain and shortness of breath, or you cough up blood.   You have severe belly pain.  Call your doctor now or seek immediate medical care if:   You are sick to your stomach or cannot keep fluids down.   Your urine is still red or you see blood clots after you have urinated several times.   You have trouble passing urine or stool, especially if you have pain or swelling in your lower belly.   You have signs of a blood clot, such as:   Pain in your calf, back of the knee, thigh, or groin.   Redness and swelling in your leg or groin.   You develop a fever or severe chills.   You have pain in your back just below your rib cage. This is called flank pain.  Watch closely for changes in your health, and be sure to contact your doctor if:   You have pain or burning when you urinate. A burning feeling is normal for a day or two after the test, but call if it does not get better.   You have a frequent urge to urinate but can pass only small amounts of urine.   Your urine is pink, red, or cloudy, or smells bad. It is normal for the urine to have a pinkish color for a few days after the test, but call if it does not get better.   Where can you learn more?   Go to [email or web address] and sign in to your MyChart account. Enter [phone] in the [place] box to learn more about "Cystoscopy: What to Expect at Home."    If you do not have an account, please click on the "Sign Up Now" link.      2006-2015 Healthwise, Inc[employer]. Care instructions adapted under license by [facility]. This care instruction is for use with your licensed healthcare professional. If you have questions about a medical condition or this instruction, always ask your healthcare professional. [place] any warranty or liability for your use of this information.  Content Version: 10.5.422740; Current as of:  May 29, 2013

## 2014-04-24 NOTE — Progress Notes (Signed)
Patient seen and examined. Pt is refusing surgery. Case discussed with Dr Maryann Alar this am. No complaints of unusual SOB, nausea, vomiting, chest pain,abd. pain, dizziness, diarrhea or constipation.    BP 105/68    Pulse 74    Temp(Src) 97.1 ??F (36.2 ??C) (Oral)    Resp 18    Ht 5' 9"$  (1.753 m)    Wt 305 lb (138.347 kg)    BMI 45.02 kg/m2      SpO2 95%     HEENT: negative to gross visual examination  Heart: regular rate and rhythm  Lungs: clear  Abdomen: soft, positive bowel sounds,markedly obese, no hepatosplenomegaly, no guarding, rebound, rigidity  Ext: no clubbing, cyanosis, erythema, edema  Neuro: alert and oriented. No gross deficits    CBC with Differential:    Lab Results   Component Value Date    WBC 8.8 04/24/2014    RBC 4.60 04/24/2014    HGB 10.0 04/24/2014    HCT 31.7 04/24/2014    PLT 154 04/24/2014    MCV 68.9 04/24/2014    MCH 21.7 04/24/2014    MCHC 31.5 04/24/2014    RDW 17.1 04/24/2014    LYMPHOPCT 42 04/23/2014    MONOPCT 14 04/23/2014    BASOPCT 0 04/23/2014    MONOSABS 0.84 04/23/2014    LYMPHSABS 2.60 04/23/2014    EOSABS 0.08 04/23/2014    BASOSABS 0.02 04/23/2014     CMP:    Lab Results   Component Value Date    NA 138 04/24/2014    K 4.0 04/24/2014    CL 103 04/24/2014    CO2 24 04/24/2014    BUN 7 04/24/2014    CREATININE 1.1 04/24/2014    GFRAA >60 04/24/2014    LABGLOM >60 04/24/2014    GLUCOSE 111 04/24/2014    PROT 5.9 04/23/2014    LABALBU 3.0 04/23/2014    CALCIUM 8.2 04/24/2014    BILITOT 0.3 04/23/2014    ALKPHOS 40 04/23/2014    AST 13 04/23/2014    ALT 9 04/23/2014     U/A:    Lab Results   Component Value Date    NITRU POSITIVE 04/22/2014    COLORU RED 04/22/2014    PHUR 7.0 04/22/2014    WBCUA 10-20 04/22/2014    RBCUA PACKED 04/22/2014    BACTERIA RARE 04/22/2014    CLARITYU TURBID 04/22/2014    SPECGRAV 1.015 04/22/2014    LEUKOCYTESUR TRACE 04/22/2014    UROBILINOGEN 1.0 04/22/2014    BILIRUBINUR Negative 04/22/2014    BLOODU LARGE 04/22/2014    GLUCOSEU Negative 04/22/2014    KETUA Negative 04/22/2014       No results for input(s): GLUMET in the last 72 hours.    Xr  Urogram W Wo Kub W Wo Tomogram    04/23/2014   Reading Location: 200    History: Hematuria   Comparison:  None   Technique: 18 images were obtained intraoperatively.    Findings: Images demonstrate filling of the bladder with no filling defects. Total fluoroscopy time was 0.28 minutes.      04/23/2014   IMPRESSION: Intraoperative images as above.       ??? cefTRIAXone (ROCEPHIN) IV  1 g Intravenous Q24H   ??? sodium chloride flush  10 mL Intravenous Q12H [facility]        Assessment;  Active Hospital Problems    Prostate ca [facility])      Hematuria      Obesity, unspecified  Plan:  Cont tx  Discharge today-pt is returning to his home in am  See orders.        Joelyn Oms, DO  11:29 AM  04/24/2014

## 2014-04-24 NOTE — Plan of Care (Signed)
Problem: Pain:  Goal: Pain level will decrease  Pain level will decrease   Outcome: Ongoing    Problem: Anxiety:  Goal: Ability to modify response to factors that promote anxiety will improve  Ability to modify response to factors that promote anxiety will improve   Outcome: Ongoing  Goal: Level of anxiety will decrease  Level of anxiety will decrease   Outcome: Ongoing

## 2014-04-24 NOTE — Progress Notes (Signed)
04/24/2014 10:18 AM  Service: Urology  Group: [facility] ([facility])    Billy Charles  [id number]    Subjective:  Pt anxious to get back to [facility]  Explained need for the catheter and the risk of recurrent clot retention but he wants to take his chances without foley  He has made  Arrangements to have f/u care in [place]    Review of Systems  Respiratory: negative  Cardiovascular: negative  Gastrointestinal: negative  Hematologic/lymphatic: negative  Musculoskeletal:negative  Neurological: negative  Endocrine: negative    Scheduled Meds:  ??? cefTRIAXone (ROCEPHIN) IV  1 g Intravenous Q24H   ??? sodium chloride flush  10 mL Intravenous Q12H [facility]       Objective:  Filed Vitals:    04/24/14 0735   BP: 105/68   Pulse: 74   Temp: 97.1 ??F (36.2 ??C)   Resp: 18         Allergies: Review of patient's allergies indicates no known allergies.    General Appearance: alert and oriented to person, place and time and in no acute distress  Skin: no rash or erythema  Head: normocephalic and atraumatic  Pulmonary/Chest: clear to auscultation bilaterally- no wheezes, rales or rhonchi, normal air movement, no respiratory distress and no chest wall tenderness  Abdomen: soft, non-tender, non-distended, normal bowel sounds, no masses or organomegaly and no inguinal adenopathy  Genitalia: No penile or scrotal swelling or masses. Extremities: no cyanosis, clubbing or edema and Homan's sign negative bilaterally      Foley well positioned and draining  Red nonclotted  urine.    Labs:     Recent Labs      04/24/14   0421   NA  138   K  4.0   CL  103   CO2  24   BUN  7*   CREATININE  1.1   GLUCOSE  111*   CALCIUM  8.2*       Lab Results   Component Value Date    HGB 10.0 04/24/2014    HCT 31.7 04/24/2014         Assessment:  Billy Charles 73 y.o. 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

## 2014-04-24 NOTE — Discharge Summary (Signed)
Physician Discharge Summary     Patient ID:  Billy Charles  [id number]  73 y.o.  06/13/1941    Admit date: 04/22/2014    Discharge date and time: 04/24/2014    Admitting Physician: Farrel Gobbleouglas A Dunlap, DO     Admission Diagnoses: ANEMIA    Discharge Diagnoses: clot retention  Bleeding from prostatic fossa    Hospital Course: surgery    Treatments: IV hydration    Disposition: home    Patient Instructions:   Current Discharge Medication List      START taking these medications    Details   cephALEXin (KEFLEX) 500 MG capsule Take 1 capsule by mouth 3 times daily for 7 days  Qty: 21 capsule, Refills: 0      tamsulosin (FLOMAX) 0.4 MG capsule Take 1 capsule by mouth daily  Qty: 10 capsule, Refills: 11      oxyCODONE-acetaminophen (PERCOCET) 5-325 MG per tablet Take 1 tablet by mouth every 4 hours as needed for Pain  Qty: 30 tablet, Refills: 0               Signed:  Waynetta Peanichard A Karlina Suares, MD  04/24/2014  10:24 AM

## 2014-04-24 NOTE — Progress Notes (Signed)
[facility] Quality Flow/Interdisciplinary Rounds Progress Note        Quality Flow Rounds held on April 24, 2014    Disciplines Attending:  Bedside Nurse, Social Worker, Case Manager and Nursing Unit Leadership    Tayte Mcwherter was admitted on 04/22/2014  8:43 PM    Anticipated Discharge Date:  Expected Discharge Date: 04/25/14    Disposition: home    Braden Score:  Braden Scale Score: 21    Readmission Risk           Readmission Risk:       2.25       Age 73 or Greater: 1    Admitted from SNF or Requires Paid or Family Care: 0    Currently has CHF,COPD,ARF,CRI,or is on dialysis: 0    Takes more than 5 Prescription Medications: 0    Takes Digoxin,Insulin,Anticoagulants,Narcotics or ASA/Plavix: 0    Hospital Admit in Past 12 Months: 0    On Disability: 0    Patient Considers own Health: 1.25          Discussed patient goal for the day, patient clinical progression, and barriers to discharge.  The following Goal(s) of the Day/Commitment(s) have been identified:  Prompt for DC today      [facility]  April 24, 2014

## 2014-04-25 LAB — CULTURE, URINE: Urine Culture, Routine: <10000

## 2014-04-28 LAB — CULTURE, BLOOD 2: Culture, Blood 2: 5

## 2014-04-28 LAB — CULTURE BLOOD #1: Blood Culture, Routine: 5

## 2014-06-13 HISTORY — PX: CYSTOSCOPY: SUR368

## 2016-07-22 ENCOUNTER — Emergency Department (HOSPITAL_COMMUNITY)
Admission: EM | Admit: 2016-07-22 | Discharge: 2016-07-22 | Disposition: A | Discharge status: Home / Self Care | Payer: Medicare HMO | Attending: Emergency Medicine | Admitting: Emergency Medicine

## 2016-07-22 ENCOUNTER — Encounter (HOSPITAL_COMMUNITY): Payer: Self-pay | Admitting: *Deleted

## 2016-07-22 ENCOUNTER — Emergency Department (HOSPITAL_COMMUNITY): Payer: Medicare HMO

## 2016-07-22 DIAGNOSIS — R0781 Pleurodynia: Secondary | ICD-10-CM | POA: Diagnosis present

## 2016-07-22 HISTORY — DX: Obesity, unspecified: E66.9

## 2016-07-22 NOTE — Discharge Instructions (Signed)
As discussed, your evaluation today has been largely reassuring.  But, it is important that you monitor your condition carefully, and do not hesitate to return to the ED if you develop new, or concerning changes in your condition.  For the next 3 days please use ibuprofen, 400 mg 3 times daily in addition to ice packs.  Please follow-up with your physician for appropriate ongoing care.

## 2016-07-22 NOTE — ED Provider Notes (Signed)
[facility] DEPT Provider Note   CSN: [id number] Arrival date & time: 07/22/16  1143     History   Chief Complaint Chief Complaint  Patient presents with  . Back Pain    HPI Gregory Chaney is a 75 y.o. male.  HPI  Patient presents with concern of pain in the left lower anterior rib cage. Symptoms began yesterday after the patient was reaching up towards a tree branch to remove it. He had a minor jarring stepdown, but no fall, no head trauma. Since that time he said pain persistently in the aforementioned area. Pain is sharp, nonradiating, not improved with OTC medication. He denies any dyspnea, hemoptysis, chest pain, abdominal pain. He was well prior to the event.   Past Medical History:  Diagnosis Date  . Obesity     There are no active problems to display for this patient.   History reviewed. No pertinent surgical history.     Home Medications    Prior to Admission medications   Not on File    Family History History reviewed. No pertinent family history.  Social History Social History  Substance Use Topics  . Smoking status: Never Smoker  . Smokeless tobacco: Not on file  . Alcohol use No     Allergies   Cephalexin; Flomax [tamsulosin hcl]; and Percocet [oxycodone-acetaminophen]   Review of Systems Review of Systems  Constitutional:       Per HPI, otherwise negative  HENT:       Per HPI, otherwise negative  Respiratory:       Per HPI, otherwise negative  Cardiovascular:       Per HPI, otherwise negative  Gastrointestinal: Negative for vomiting.  Endocrine:       Negative aside from HPI  Genitourinary:       Neg aside from HPI   Musculoskeletal:       Per HPI, otherwise negative  Skin: Negative.   Neurological: Negative for syncope.     Physical Exam Updated Vital Signs BP 169/82 (BP Location: Right Arm)   Pulse 67   Temp 97.9 F (36.6 C) (Oral)   Resp 18   Ht 5\' 9"  (1.753 m)   Wt 271 lb (122.9 kg)   SpO2 97%   BMI  40.02 kg/m   Physical Exam  Constitutional: He is oriented to person, place, and time. He appears well-developed. No distress.  HENT:  Head: Normocephalic and atraumatic.  Eyes: Conjunctivae and EOM are normal.  Cardiovascular: Normal rate and regular rhythm.   Pulmonary/Chest: Effort normal. No stridor. No respiratory distress.    Abdominal: He exhibits no distension.  Musculoskeletal: He exhibits no edema.  Neurological: He is alert and oriented to person, place, and time.  Skin: Skin is warm and dry.  Psychiatric: He has a normal mood and affect.  Nursing note and vitals reviewed.    ED Treatments / Results   Radiology Dg Ribs Unilateral W/chest Left  Result Date: 07/22/2016 CLINICAL DATA:  Chest pain following trimming tree ease, initial encounter EXAM: LEFT RIBS AND CHEST - 3+ VIEW COMPARISON:  02/18/2009 FINDINGS: Cardiac shadow is within normal limits. The lungs are well aerated bilaterally. No pneumothorax is seen. No acute rib fracture is identified. IMPRESSION: No acute abnormality noted. Electronically Signed   By: Inez Catalina M.D.   On: 07/22/2016 13:41    Procedures Procedures (including critical care time)  Medications Ordered in ED Medications - No data to display Pulse oximetry 100% room air normal  Initial Impression / Assessment and Plan / ED Course  I have reviewed the triage vital signs and the nursing notes.  Pertinent labs & imaging results that were available during my care of the patient were reviewed by me and considered in my medical decision making (see chart for details).   On repeat exam the patient is in no distress. I discussed all findings with patient and a male companion. We discussed the importance of ice, anti-inflammatories, close monitoring. With no other new complaints, no evidence for fracture, no evidence for pneumonia, the patient was discharged in stable condition with primary care follow-up.   Carmin Muskrat, MD 07/22/16  1400

## 2016-07-22 NOTE — ED Triage Notes (Addendum)
Pt reports pulling a limb down yesterday and pulling something to left rib area. Having pain all night, pain increases with movement.

## 2016-07-22 NOTE — ED Notes (Signed)
EDP at bedside  

## 2016-10-23 ENCOUNTER — Encounter (HOSPITAL_COMMUNITY): Payer: Self-pay

## 2016-10-23 ENCOUNTER — Emergency Department (HOSPITAL_COMMUNITY): Payer: Medicare PPO

## 2016-10-23 ENCOUNTER — Emergency Department (HOSPITAL_COMMUNITY)
Admission: EM | Admit: 2016-10-23 | Discharge: 2016-10-24 | Disposition: A | Discharge status: Home / Self Care | Payer: Medicare PPO | Attending: Emergency Medicine | Admitting: Emergency Medicine

## 2016-10-23 DIAGNOSIS — C61 Malignant neoplasm of prostate: Secondary | ICD-10-CM | POA: Diagnosis not present

## 2016-10-23 DIAGNOSIS — M5441 Lumbago with sciatica, right side: Secondary | ICD-10-CM

## 2016-10-23 DIAGNOSIS — M545 Low back pain: Secondary | ICD-10-CM | POA: Diagnosis present

## 2016-10-23 DIAGNOSIS — C7951 Secondary malignant neoplasm of bone: Secondary | ICD-10-CM | POA: Diagnosis not present

## 2016-10-23 DIAGNOSIS — M549 Dorsalgia, unspecified: Secondary | ICD-10-CM

## 2016-10-23 LAB — CBC WITH DIFFERENTIAL/PLATELET
BASOS ABS: 0 10*3/uL (ref 0.0–0.1)
Basophils Relative: 0 %
EOS PCT: 1 %
Eosinophils Absolute: 0 10*3/uL (ref 0.0–0.7)
HEMATOCRIT: 36.1 % — AB (ref 39.0–52.0)
HEMOGLOBIN: 12.1 g/dL — AB (ref 13.0–17.0)
LYMPHS PCT: 36 %
Lymphs Abs: 1.7 10*3/uL (ref 0.7–4.0)
MCH: 21.6 pg — ABNORMAL LOW (ref 26.0–34.0)
MCHC: 33.5 g/dL (ref 30.0–36.0)
MCV: 64.6 fL — AB (ref 78.0–100.0)
MONOS PCT: 9 %
Monocytes Absolute: 0.4 10*3/uL (ref 0.1–1.0)
NEUTROS ABS: 2.7 10*3/uL (ref 1.7–7.7)
Neutrophils Relative %: 54 %
Platelets: 189 10*3/uL (ref 150–400)
RBC: 5.59 MIL/uL (ref 4.22–5.81)
RDW: 15.5 % (ref 11.5–15.5)
WBC: 4.8 10*3/uL (ref 4.0–10.5)

## 2016-10-23 LAB — I-STAT CHEM 8, ED
BUN: 10 mg/dL (ref 6–20)
CHLORIDE: 105 mmol/L (ref 101–111)
CREATININE: 1 mg/dL (ref 0.61–1.24)
Calcium, Ion: 1.19 mmol/L (ref 1.15–1.40)
Glucose, Bld: 99 mg/dL (ref 65–99)
HEMATOCRIT: 39 % (ref 39.0–52.0)
HEMOGLOBIN: 13.3 g/dL (ref 13.0–17.0)
POTASSIUM: 4 mmol/L (ref 3.5–5.1)
Sodium: 141 mmol/L (ref 135–145)
TCO2: 25 mmol/L (ref 0–100)

## 2016-10-23 MED ORDER — ONDANSETRON HCL 4 MG/2ML IJ SOLN
4.0000 mg | Freq: Once | INTRAMUSCULAR | Status: AC
  Administered 2016-10-23: 4 mg via INTRAVENOUS
  Filled 2016-10-23: qty 2

## 2016-10-23 MED ORDER — KETOROLAC TROMETHAMINE 60 MG/2ML IM SOLN
30.0000 mg | Freq: Once | INTRAMUSCULAR | Status: AC
  Administered 2016-10-23: 30 mg via INTRAMUSCULAR
  Filled 2016-10-23: qty 2

## 2016-10-23 MED ORDER — FENTANYL CITRATE (PF) 100 MCG/2ML IJ SOLN
25.0000 ug | Freq: Once | INTRAMUSCULAR | Status: AC
  Administered 2016-10-23: 25 ug via INTRAVENOUS
  Filled 2016-10-23: qty 2

## 2016-10-23 MED ORDER — CYCLOBENZAPRINE HCL 10 MG PO TABS
10.0000 mg | ORAL_TABLET | Freq: Once | ORAL | Status: AC
  Administered 2016-10-23: 10 mg via ORAL
  Filled 2016-10-23: qty 1

## 2016-10-23 MED ORDER — GADOBENATE DIMEGLUMINE 529 MG/ML IV SOLN
20.0000 mL | Freq: Once | INTRAVENOUS | Status: AC | PRN
  Administered 2016-10-23: 20 mL via INTRAVENOUS

## 2016-10-23 NOTE — ED Triage Notes (Signed)
Onset this morning mid lower back pain radiating down anterior and posterior leg.  No known injuries.

## 2016-10-23 NOTE — ED Provider Notes (Signed)
[facility] DEPT Provider Note   CSN: [id number] Arrival date & time: 10/23/16  [id number]     History   Chief Complaint Chief Complaint  Patient presents with  . Back Pain  . Leg Pain    HPI Gregory Chaney is a 76 y.o. male.  The history is provided by the patient.  Back Pain   This is a new problem. The current episode started 6 to 12 hours ago. The problem occurs constantly. The problem has been gradually worsening. The pain is associated with no known injury (started when he was driving his car today). The pain is present in the lumbar spine. The quality of the pain is described as shooting, burning and aching. The pain radiates to the right thigh and right knee. The pain is at a severity of 10/10. The pain is severe. The symptoms are aggravated by bending and twisting (walking). The pain is the same all the time. Associated symptoms include numbness, leg pain and tingling. Pertinent negatives include no chest pain, no fever, no abdominal pain, no bowel incontinence, no bladder incontinence, no dysuria and no weakness. Associated symptoms comments: Later today has started to have some foot numbness. He has tried NSAIDs for the symptoms. The treatment provided no relief. Risk factors include obesity and lack of exercise (no recent trauma).  Leg Pain   Associated symptoms include numbness and tingling.    Past Medical History:  Diagnosis Date  . Obesity     There are no active problems to display for this patient.   History reviewed. No pertinent surgical history.     Home Medications    Prior to Admission medications   Not on File    Family History History reviewed. No pertinent family history.  Social History Social History  Substance Use Topics  . Smoking status: Never Smoker  . Smokeless tobacco: Never Used  . Alcohol use Not on file     Allergies   Cephalexin; Flomax [tamsulosin hcl]; and Percocet [oxycodone-acetaminophen]   Review of Systems Review of  Systems  Constitutional: Negative for fever.  Cardiovascular: Negative for chest pain.  Gastrointestinal: Negative for abdominal pain and bowel incontinence.  Genitourinary: Negative for bladder incontinence and dysuria.  Musculoskeletal: Positive for back pain.  Neurological: Positive for tingling and numbness. Negative for weakness.  All other systems reviewed and are negative.    Physical Exam Updated Vital Signs BP 147/96 (BP Location: Right Arm)   Pulse 70   Temp 98.5 F (36.9 C) (Oral)   Resp 20   Ht 5\' 9"  (1.753 m)   Wt 265 lb (120.2 kg)   SpO2 100%   BMI 39.13 kg/m   Physical Exam  Constitutional: He is oriented to person, place, and time. He appears well-developed and well-nourished. No distress.  HENT:  Head: Normocephalic and atraumatic.  Mouth/Throat: Oropharynx is clear and moist.  Eyes: Conjunctivae and EOM are normal. Pupils are equal, round, and reactive to light.  Neck: Normal range of motion. Neck supple.  Cardiovascular: Normal rate, regular rhythm and intact distal pulses.   No murmur heard. Pulmonary/Chest: Effort normal and breath sounds normal. No respiratory distress. He has no wheezes. He has no rales.  Abdominal: Soft. He exhibits no distension. There is no tenderness. There is no rebound and no guarding.  Musculoskeletal: Normal range of motion. He exhibits tenderness. He exhibits no edema.       Lumbar back: He exhibits tenderness, bony tenderness, pain and spasm.  Back:  Neurological: He is alert and oriented to person, place, and time. He has normal strength.  Reflex Scores:      Patellar reflexes are 1+ on the left side. 5/5 strength in right thigh, hamstring and calf muscle.  Sensation intact in the right foot.  2+ DP pulse in the right foot  Skin: Skin is warm and dry. No rash noted. No erythema.  Psychiatric: He has a normal mood and affect. His behavior is normal.  Nursing note and vitals reviewed.    ED Treatments / Results    Labs (all labs ordered are listed, but only abnormal results are displayed) Labs Reviewed - No data to display  EKG  EKG Interpretation None       Radiology Ct Lumbar Spine Wo Contrast  Result Date: 10/23/2016 CLINICAL DATA:  Low back pain progressing for "several weeks" new onset right leg pain/ weakness this am, h/o prostate ca EXAM: CT LUMBAR SPINE WITHOUT CONTRAST TECHNIQUE: Multidetector CT imaging of the lumbar spine was performed without intravenous contrast administration. Multiplanar CT image reconstructions were also generated. COMPARISON:  None. FINDINGS: Segmentation: 5 lumbar type vertebrae. Alignment: Normal. Vertebrae: Mixed sclerotic and lytic lesions, predominantly lytic, affecting each of the visualized vertebral bodies from the T11 through L5 levels. The largest lesions are seen at the T11, L1 and L5 vertebral bodies. Suspect associated soft tissue component extending to the central canal and/or right neural foramen at the L4-5 and L5-S1 levels, most likely source for patient's right-sided radiculopathy. Additional central canal involvement at the other levels cannot be excluded on this exam. Paraspinal and other soft tissues: Atherosclerotic changes of the infrarenal abdominal aorta. Irregular soft tissue density mass within the left lower pelvis, compatible with given history of prostate cancer. IMPRESSION: IMPRESSION 1. 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 Largest lesions are seen at the T11, L1 and L5 vertebral body levels. Suspected associated soft tissue components extending posteriorly to the central canal and/or right neural foramen at the L4-5 and L5-S1 levels, most likely source for patient's right-sided radiculopathy. Additional levels of central canal encroachment cannot be excluded. Recommend MRI with contrast for further characterization. 2. Soft tissue density mass within the left lower pelvis,  presumably patient's known prostate cancer. 3. Aortic atherosclerosis. These results were called by telephone at the time of interpretation on 10/23/2016 at 9:05 pm to Dr. Blanchie Dessert , who verbally acknowledged these results. Electronically Signed   By: Franki Cabot M.D.   On: 10/23/2016 21:06    Procedures Procedures (including critical care time)  Medications Ordered in ED Medications  cyclobenzaprine (FLEXERIL) tablet 10 mg (not administered)  ketorolac (TORADOL) injection 30 mg (not administered)     Initial Impression / Assessment and Plan / ED Course  I have reviewed the triage vital signs and the nursing notes.  Pertinent labs & imaging results that were available during my care of the patient were reviewed by me and considered in my medical decision making (see chart for details).    Pt with onset of back pain suggestive of lumbar radiculopathy.  No neurovascular compromise and no incontinence.  Pt has no infectious sx, but does have hx of prostate CA and states he was healed but does not get routine PSA's checked.  No IVDU.  Pt is able to ambulate but is painful and has a hard time walking only a few steps.  Normal strength and reflexes on exam.  Denies trauma. Will  give pt pain control (toradol and flexeril) and will get CT of lumbar spine to ensure no bony mets. 11:42 PM CT lumbar spine with multiple lesions concerning for mets.  Radiology recommends MRI with contrast for further care.  Pt checked out to Dr. Betsey Holiday at 12:00  Final Clinical Impressions(s) / ED Diagnoses   Final diagnoses:  Back pain  Acute right-sided low back pain with right-sided sciatica    New Prescriptions New Prescriptions   No medications on file     Blanchie Dessert, MD 10/25/16 2045

## 2016-10-24 MED ORDER — TRAMADOL HCL 50 MG PO TABS
50.0000 mg | ORAL_TABLET | Freq: Once | ORAL | Status: AC
  Administered 2016-10-24: 50 mg via ORAL
  Filled 2016-10-24: qty 1

## 2016-10-24 MED ORDER — DEXAMETHASONE 4 MG PO TABS: 4.0000 mg | ORAL_TABLET | Freq: Four times a day (QID) | ORAL | 0 refills | Status: DC

## 2016-10-24 MED ORDER — TRAMADOL HCL 50 MG PO TABS: 50.0000 mg | ORAL_TABLET | Freq: Four times a day (QID) | ORAL | 0 refills | Status: DC | PRN

## 2016-10-24 NOTE — Discharge Instructions (Signed)
Your prostate cancer has spread into her back. You will need to follow-up with oncology to arrange for radiation treatment urgently.

## 2016-10-24 NOTE — ED Provider Notes (Signed)
Assumed care the patient with MRI pending. Patient with 2 day history of lumbar radiculopathy on the right side. CT of lumbar spine revealed lytic lesions consistent with metastatic disease. Patient scheduled for MRI tonight.  I did further discuss patient's history with him. He tells me that he was diagnosed with prostate cancer in the 90s, was supposed to undergo treatment including radiation therapy. He reports that he prayed on it and guide appeared to him, told him he was cured. He never followed up for treatment.   MRI tonight shows multiple areas of tumor in the lumbar and lower thoracic region. There are pathologic fractures present. There is also some evidence of spinal stenosis and bulging discs without rupture. Findings discussed with neurosurgery. As patient has not had any bowel or bladder changes, no identifiable weakness or sensory deficit on examination, can be followed up in the clinic as an outpatient.  Findings discussed with Dr. Alvy Bimler, on call for oncology. Patient will be scheduled for follow-up in the office to facilitate treatment including arrange for radiation oncology.  Results for orders placed or performed during the hospital encounter of 10/23/16  CBC with Differential/Platelet  Result Value Ref Range   WBC 4.8 4.0 - 10.5 K/uL   RBC 5.59 4.22 - 5.81 MIL/uL   Hemoglobin 12.1 (L) 13.0 - 17.0 g/dL   HCT 36.1 (L) 39.0 - 52.0 %   MCV 64.6 (L) 78.0 - 100.0 fL   MCH 21.6 (L) 26.0 - 34.0 pg   MCHC 33.5 30.0 - 36.0 g/dL   RDW 15.5 11.5 - 15.5 %   Platelets 189 150 - 400 K/uL   Neutrophils Relative % 54 %   Lymphocytes Relative 36 %   Monocytes Relative 9 %   Eosinophils Relative 1 %   Basophils Relative 0 %   Neutro Abs 2.7 1.7 - 7.7 K/uL   Lymphs Abs 1.7 0.7 - 4.0 K/uL   Monocytes Absolute 0.4 0.1 - 1.0 K/uL   Eosinophils Absolute 0.0 0.0 - 0.7 K/uL   Basophils Absolute 0.0 0.0 - 0.1 K/uL   RBC Morphology TARGET CELLS   I-stat chem 8, ed  Result Value Ref Range    Sodium 141 135 - 145 mmol/L   Potassium 4.0 3.5 - 5.1 mmol/L   Chloride 105 101 - 111 mmol/L   BUN 10 6 - 20 mg/dL   Creatinine, Ser 1.00 0.61 - 1.24 mg/dL   Glucose, Bld 99 65 - 99 mg/dL   Calcium, Ion 1.19 1.15 - 1.40 mmol/L   TCO2 25 0 - 100 mmol/L   Hemoglobin 13.3 13.0 - 17.0 g/dL   HCT 39.0 39.0 - 52.0 %   Ct Lumbar Spine Wo Contrast  Result Date: 10/23/2016 CLINICAL DATA:  Low back pain progressing for "several weeks" new onset right leg pain/ weakness this am, h/o prostate ca EXAM: CT LUMBAR SPINE WITHOUT CONTRAST TECHNIQUE: Multidetector CT imaging of the lumbar spine was performed without intravenous contrast administration. Multiplanar CT image reconstructions were also generated. COMPARISON:  None. FINDINGS: Segmentation: 5 lumbar type vertebrae. Alignment: Normal. Vertebrae: Mixed sclerotic and lytic lesions, predominantly lytic, affecting each of the visualized vertebral bodies from the T11 through L5 levels. The largest lesions are seen at the T11, L1 and L5 vertebral bodies. Suspect associated soft tissue component extending to the central canal and/or right neural foramen at the L4-5 and L5-S1 levels, most likely source for patient's right-sided radiculopathy. Additional central canal involvement at the other levels cannot be excluded on this  exam. Paraspinal and other soft tissues: Atherosclerotic changes of the infrarenal abdominal aorta. Irregular soft tissue density mass within the left lower pelvis, compatible with given history of prostate cancer. IMPRESSION: IMPRESSION 1. 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 Largest lesions are seen at the T11, L1 and L5 vertebral body levels. Suspected associated soft tissue components extending posteriorly to the central canal and/or right neural foramen at the L4-5 and L5-S1 levels, most likely source for patient's right-sided radiculopathy. Additional levels of  central canal encroachment cannot be excluded. Recommend MRI with contrast for further characterization. 2. Soft tissue density mass within the left lower pelvis, presumably patient's known prostate cancer. 3. Aortic atherosclerosis. These results were called by telephone at the time of interpretation on 10/23/2016 at 9:05 pm to Dr. Blanchie Dessert , who verbally acknowledged these results. Electronically Signed   By: Franki Cabot M.D.   On: 10/23/2016 21:06   Mr Lumbar Spine W Wo Contrast  Result Date: 10/24/2016 CLINICAL DATA:  Acute onset low back pain, hip and RIGHT leg pain. History of prostate cancer. Followup metastasis. EXAM: MRI LUMBAR SPINE WITHOUT AND WITH CONTRAST TECHNIQUE: Multiplanar and multiecho pulse sequences of the lumbar spine were obtained without and with intravenous contrast. CONTRAST:  2mL MULTIHANCE GADOBENATE DIMEGLUMINE 529 MG/ML IV SOLN COMPARISON:  CT lumbar spine October 23, 2016 at 8:39 p.m. FINDINGS: SEGMENTATION: For the purposes of this report, the last well-formed intervertebral disc will be described as L5-S1. ALIGNMENT: Maintenance of the lumbar lordosis. No malalignment. VERTEBRAE:Diffuse osseous metastasis within all included axial skeleton. Tumoral osseous expansion of multiple included thoracic and, lumbar levels. Mild less than 20% T12 and L2 pathologic fractures with generalized low T1 and bright STIR signal favoring acute injury. Disc morphology generally maintained with mild desiccation. Acute L4 inferior endplate Schmorl's node with associated disc edema. Additional acute to old Schmorl's nodes. CONUS MEDULLARIS: Conus medullaris terminates at L2 and appears normal. No suspicious cord or leptomeningeal enhancement. Central displacement of the cauda equina due to canal stenosis. PARASPINAL AND SOFT TISSUES: Included view of the pelvis demonstrates at least 5.8 cm base of bladder mass, possibly prostatic. Mild paraspinal muscle strain without suspicious enhancement.  Asymmetrically atrophic RIGHT psoas muscle. DISC LEVELS: T11-12: Slight tumoral expansion LEFT middle and posterior column. No disc bulge or canal stenosis. Mild facet arthropathy and mild neural foraminal narrowing. T12-L1: 3 mm tumoral expansion posterior T12 vertebral body. Tumoral expansion T12 spinous process and LEFT lamina invading the adjacent paraspinal soft tissues. Mild facet arthropathy and ligamentum flavum redundancy. Mild canal stenosis at L1, no disc bulge or neural foraminal narrowing. L1-2: No disc bulge, canal stenosis. Mild tumoral expansion LEFT L2 posterior elements resulting in mild LEFT neural foraminal narrowing. L2-3: Tumoral expansion L1 spinous process. Tumoral expansion LEFT L3 superior articular facet. No canal stenosis. Moderate LEFT neural foraminal narrowing. L3-4: Mild to moderate facet arthropathy and ligamentum flavum redundancy with trace facet effusions, likely reactive. 8 mm RIGHT facet synovial cyst extending to the paraspinal soft tissues. No disc bulge. No canal stenosis. No neural foraminal narrowing. L4-5: Small broad-based disc bulge. Severe facet arthropathy and ligamentum flavum redundancy. Bilateral facet effusions, facets are widened to 4 mm. 9 mm enhancing 13 mm synovial cyst extending to the paraspinal soft tissues. Severe canal stenosis. Mild to moderate RIGHT, moderate LEFT neural foraminal narrowing. L5-S1: 6 mm tumoral expansion RIGHT posterior L5 vertebral body effacing the RIGHT lateral recess, displacing the traversing RIGHT L5 nerve.  Small L5-S1 broad-based disc bulge. Severe RIGHT and moderate LEFT facet arthropathy ligamentum flavum redundancy with trace RIGHT facet effusion which is likely reactive. Moderate canal stenosis at L5, no canal stenosis at L5-S1. Moderate to severe bilateral neural foraminal narrowing. IMPRESSION: 1. Diffuse osseous metastasis. Mild acute T12 and L2 pathologic fractures. Tumoral expansion of the T12 posterior elements invading  the paraspinal soft tissues. 2. 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 Tumoral expansion RIGHT L5 vertebral body displaces the traversing RIGHT L5 nerve. 3. Multilevel neural foraminal narrowing: Moderate to severe at L5-S1. 4. Base of bladder mass suspicious for prostate invasion and tumor. Electronically Signed   By: Elon Alas M.D.   On: 10/24/2016 00:34      Orpah Greek, MD 10/24/16 [phone]

## 2016-10-25 ENCOUNTER — Telehealth: Payer: Self-pay | Admitting: Oncology

## 2016-10-25 ENCOUNTER — Encounter: Payer: Self-pay | Admitting: Oncology

## 2016-10-25 NOTE — Progress Notes (Signed)
Histology and Location of Primary Cancer:  ? Prostate Cancer  Sites of Visceral and Bony Metastatic Disease:  MRI 10/23/16 IMPRESSION: 1. Diffuse osseous metastasis. Mild acute T12 and L2 pathologic fractures. Tumoral expansion of the T12 posterior elements invading the paraspinal soft tissues. 2. 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 Tumoral expansion RIGHT L5 vertebral body displaces the traversing RIGHT L5 nerve. 3. Multilevel neural foraminal narrowing: Moderate to severe at L5-S1. 4. Base of bladder mass suspicious for prostate invasion and tumor.   Location(s) of Symptomatic Metastases: CT 10/23/16 Mixed sclerotic and lytic lesions, predominantly lytic, affecting each of the visualized vertebral bodies from the T11 through L5 levels. The largest lesions are seen at the T11, L1 and L5 vertebral bodies.   Past/Anticipated chemotherapy by medical oncology, if any: Dr. Alen Blew 10/29/16 at 11:00  Pain on a scale of 0-10 is: He reports a pain of 8/10 in his Right Leg when moving.    If Spine Met(s), symptoms, if any, include:  He presented to the ED on 10/23/16 with a 2 day history of pain radiating down his Right Leg  Bowel/Bladder retention or incontinence (please describe): He denies.   Numbness or weakness in extremities (please describe): He reports weakness and numbness in his right leg.   Current Decadron regimen, if applicable: 4 mg four times a day prescribed in the ED on 10/24/16. He tells me he has not been taking these as directed since being prescribed, instead only when he has pain. He has been advised on the proper way to take them and the reasoning.   Ambulatory status? Walker? Wheelchair?: He is ambulating at home. His weakness has increased over the past few days, and he is in a borrowed wheel chair from the cancer center.   SAFETY ISSUES:  Prior radiation? No  Pacemaker/ICD? No  Possible current pregnancy? No  Is the patient on methotrexate? No  Current Complaints /  other details:   Dr. Joseph Berkshire in ED on 10/24/16 documents: I did further discuss patient's history with him. He tells me that he was diagnosed with prostate cancer in the 90s, was supposed to undergo treatment including radiation therapy. He reports that he prayed on it and guide appeared to him, told him he was cured. He never followed up for treatment.  BP (!) 165/93   Pulse 62   Temp 98.2 F (36.8 C)   Ht '5\' 9"'  (1.753 m)   Wt 268 lb 3.2 oz (121.7 kg)   SpO2 96% Comment: room air  BMI 39.61 kg/m    Wt Readings from Last 3 Encounters:  10/26/16 268 lb 3.2 oz (121.7 kg)  10/23/16 265 lb (120.2 kg)  07/22/16 271 lb (122.9 kg)

## 2016-10-25 NOTE — Telephone Encounter (Signed)
Lt vm regarding new pt appt on 2/09 with Dr. Alen Blew. Mailed pt letter.

## 2016-10-26 ENCOUNTER — Ambulatory Visit
Admission: RE | Admit: 2016-10-26 | Discharge: 2016-10-26 | Disposition: A | Discharge status: Home / Self Care | Payer: Medicare PPO | Source: Ambulatory Visit | Attending: Radiation Oncology | Admitting: Radiation Oncology

## 2016-10-26 ENCOUNTER — Other Ambulatory Visit: Payer: Self-pay | Admitting: Radiation Therapy

## 2016-10-26 ENCOUNTER — Encounter: Payer: Self-pay | Admitting: Radiation Oncology

## 2016-10-26 VITALS — BP 165/93 | HR 62 | Temp 98.2°F | Ht 69.0 in | Wt 268.2 lb

## 2016-10-26 DIAGNOSIS — R531 Weakness: Secondary | ICD-10-CM | POA: Diagnosis not present

## 2016-10-26 DIAGNOSIS — Z6839 Body mass index (BMI) 39.0-39.9, adult: Secondary | ICD-10-CM | POA: Diagnosis not present

## 2016-10-26 DIAGNOSIS — C7951 Secondary malignant neoplasm of bone: Secondary | ICD-10-CM

## 2016-10-26 DIAGNOSIS — E669 Obesity, unspecified: Secondary | ICD-10-CM | POA: Diagnosis not present

## 2016-10-26 DIAGNOSIS — Z885 Allergy status to narcotic agent status: Secondary | ICD-10-CM | POA: Insufficient documentation

## 2016-10-26 DIAGNOSIS — Z888 Allergy status to other drugs, medicaments and biological substances status: Secondary | ICD-10-CM | POA: Insufficient documentation

## 2016-10-26 DIAGNOSIS — C61 Malignant neoplasm of prostate: Secondary | ICD-10-CM | POA: Insufficient documentation

## 2016-10-26 DIAGNOSIS — Z881 Allergy status to other antibiotic agents status: Secondary | ICD-10-CM | POA: Insufficient documentation

## 2016-10-26 DIAGNOSIS — R200 Anesthesia of skin: Secondary | ICD-10-CM | POA: Insufficient documentation

## 2016-10-26 DIAGNOSIS — Z510 Encounter for antineoplastic radiation therapy: Secondary | ICD-10-CM | POA: Diagnosis not present

## 2016-10-26 DIAGNOSIS — C799 Secondary malignant neoplasm of unspecified site: Secondary | ICD-10-CM

## 2016-10-26 HISTORY — DX: Malignant neoplasm of prostate: C61

## 2016-10-26 MED ORDER — RANITIDINE HCL 150 MG PO TABS: 150.0000 mg | ORAL_TABLET | Freq: Two times a day (BID) | ORAL | 3 refills | Status: DC

## 2016-10-26 NOTE — Progress Notes (Signed)
Radiation Oncology         [phone] ________________________________  Initial outpatient Consultation  Name: Gregory Chaney MRN: [id number]  Date: 10/26/2016  DOB: [birth year]  CC:No primary care provider on file.  Heath Lark, MD   REFERRING PHYSICIAN: Heath Lark, MD  DIAGNOSIS:    ICD-9-CM ICD-10-CM   1. 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 male who presented with prostate cancer in 1995 and was supposed to undergo treatment including radiation therapy. He reports that he prayed on it and he felt that he was cured by God. He never followed up for treatment.  He reports an uneventful course until he developed a pain of 8/10 in his Right Leg when moving.  He presented to the ED on 10/23/16 with a 2 day history of pain radiating down his Right Leg  MRI of lumbar spine on 10/23/16 revealed this report.  I've looked at the images. IMPRESSION: 1. Diffuse osseous metastasis. Mild acute T12 and L2 pathologic fractures. Tumoral expansion of the T12 posterior elements invading the paraspinal soft tissues. 2. 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 Tumoral expansion RIGHT L5 vertebral body displaces the traversing RIGHT L5 nerve. 3. Multilevel neural foraminal narrowing: Moderate to severe at L5-S1. 4. Base of bladder mass suspicious for prostate invasion and tumor.    Also, CT 10/23/16 was done, showing: Mixed sclerotic and lytic lesions, predominantly lytic, affecting each of the visualized vertebral bodies from the T11 through L5 levels. The largest lesions are seen at the T11, L1 and L5 vertebral bodies.    Consult will be with Dr. Alen Blew 10/29/16 at 11:00 Bowel/Bladder retention or incontinence (please describe): He denies. Numbness or weakness in extremities (please describe):  He reports weakness and numbness in his right leg.  Current Decadron regimen, if applicable: 4 mg four times a day prescribed in the ED on 10/24/16. He tells nursing  he has not been taking these as directed since being prescribed, instead only when he has pain. He has been advised on the proper way to take them and the reasoning.    Ambulatory status? Walker? Wheelchair?: He is ambulating at home. His weakness has increased over the past few days, and he is in a borrowed wheel chair from the cancer center.    SAFETY ISSUES:  Prior radiation? No  Pacemaker/ICD? No  Possible current pregnancy? No  Is the patient on methotrexate? No     PREVIOUS RADIATION THERAPY: No  PAST MEDICAL HISTORY:  has a past medical history of Obesity and Prostate cancer ([place]) (1995).    PAST SURGICAL HISTORY: Past Surgical History:  Procedure Laterality Date  . CYSTOSCOPY  06/13/2014   cystoscopy with transrectal ultrasound biopsy of prostate    FAMILY HISTORY: family history is not on file.  SOCIAL HISTORY:  reports that he has never smoked. He has never used smokeless tobacco. He reports that he does not drink alcohol or use drugs.  ALLERGIES: Cephalexin; Flomax [tamsulosin hcl]; Percocet [oxycodone-acetaminophen]; Hydrocodone; and Morphine  MEDICATIONS:  Current Outpatient Prescriptions  Medication Sig Dispense Refill  . dexamethasone (DECADRON) 4 MG tablet Take 1 tablet (4 mg total) by mouth 4 (four) times daily. (Patient taking differently: Take 4 mg by mouth 4 (four) times daily. He has not been taking it 4 times  daily as directed, but only taking it when having pain. He has been instructed on the correct way to take medicine.) 40 tablet 0  . traMADol (ULTRAM) 50 MG tablet Take 1 tablet (50 mg total) by mouth every 6 (six) hours as needed. 30 tablet 0  . ranitidine (ZANTAC) 150 MG tablet Take 1 tablet (150 mg total) by mouth 2 (two) times daily. Take while on Dexamethasone to protect stomach. 60  tablet 3   No current facility-administered medications for this encounter.     REVIEW OF SYSTEMS: A 10+ POINT REVIEW OF SYSTEMS WAS OBTAINED including neurology, dermatology, psychiatry, cardiac, respiratory, lymph, extremities, GI, GU, Musculoskeletal, constitutional, breasts, reproductive, HEENT.  All pertinent positives are noted in the HPI.  All others are negative.   PHYSICAL EXAM:  height is 5\' 9"  (1.753 m) and weight is 268 lb 3.2 oz (121.7 kg). His temperature is 98.2 F (36.8 C). His blood pressure is 165/93 (abnormal) and his pulse is 62. His oxygen saturation is 96%.   General: Alert and oriented, in no acute distress HEENT: Head is normocephalic. Extraocular movements are intact. Oropharynx is clear. Neck: Neck is supple, no palpable cervical or supraclavicular lymphadenopathy. Heart: Regular in rate and rhythm  Chest: Clear to auscultation bilaterally, with no rhonchi, wheezes, or rales. Abdomen: Soft, nontender, nondistended, with no rigidity or guarding. Lymphatics: see Neck Exam Skin: No concerning lesions. Musculoskeletal: symmetric strength and muscle tone throughout. Neurologic: Cranial nerves II through XII are grossly intact. No obvious focalities. Speech is fluent. Coordination is intact. Psychiatric: Judgment and insight are intact. Affect is appropriate.   ECOG = 3  0 - Asymptomatic (Fully active, able to carry on all predisease activities without restriction)  1 - Symptomatic but completely ambulatory (Restricted in physically strenuous activity but ambulatory and able to carry out work of a light or sedentary nature. For example, light housework, office work)  2 - Symptomatic, <50% in bed during the day (Ambulatory and capable of all self care but unable to carry out any work activities. Up and about more than 50% of waking hours)  3 - Symptomatic, >50% in bed, but not bedbound (Capable of only limited self-care, confined to bed or chair 50% or more of waking  hours)  4 - Bedbound (Completely disabled. Cannot carry on any self-care. Totally confined to bed or chair)  5 - Death   Eustace Pen MM, Creech RH, Tormey DC, et al. [phone]). "Toxicity and response criteria of the [facility]". Bruce Oncol. 5 (6): 649-55   LABORATORY DATA:  Lab Results  Component Value Date   WBC 4.8 10/23/2016   HGB 13.3 10/23/2016   HCT 39.0 10/23/2016   MCV 64.6 (L) 10/23/2016   PLT 189 10/23/2016   CMP     Component Value Date/Time   NA 141 10/23/2016 2119   K 4.0 10/23/2016 2119   CL 105 10/23/2016 2119   GLUCOSE 99 10/23/2016 2119   BUN 10 10/23/2016 2119   CREATININE 1.00 10/23/2016 2119         RADIOGRAPHY images reviewed by me: Ct Lumbar Spine Wo Contrast  Result Date: 10/23/2016 CLINICAL DATA:  Low back pain progressing for "several weeks" new onset right leg pain/ weakness this am, h/o prostate ca EXAM: CT LUMBAR SPINE WITHOUT CONTRAST TECHNIQUE: Multidetector CT imaging of the lumbar spine was performed without intravenous contrast administration. Multiplanar CT image reconstructions were also generated. COMPARISON:  None. FINDINGS: Segmentation: 5 lumbar type vertebrae. Alignment: Normal.  Vertebrae: Mixed sclerotic and lytic lesions, predominantly lytic, affecting each of the visualized vertebral bodies from the T11 through L5 levels. The largest lesions are seen at the T11, L1 and L5 vertebral bodies. Suspect associated soft tissue component extending to the central canal and/or right neural foramen at the L4-5 and L5-S1 levels, most likely source for patient's right-sided radiculopathy. Additional central canal involvement at the other levels cannot be excluded on this exam. Paraspinal and other soft tissues: Atherosclerotic changes of the infrarenal abdominal aorta. Irregular soft tissue density mass within the left lower pelvis, compatible with given history of prostate cancer. IMPRESSION: IMPRESSION 1. Predominantly lytic  metastases throughout the lumbar spine and visualized portion of the lower thoracic spine. Largest lesions are seen at the T11, L1 and L5 vertebral body levels. Suspected associated soft tissue components extending posteriorly to the central canal and/or right neural foramen at the L4-5 and L5-S1 levels, most likely source for patient's right-sided radiculopathy. Additional levels of central canal encroachment cannot be excluded. Recommend MRI with contrast for further characterization. 2. Soft tissue density mass within the left lower pelvis, presumably patient's known prostate cancer. 3. Aortic atherosclerosis. These results were called by telephone at the time of interpretation on 10/23/2016 at 9:05 pm to Dr. Blanchie Dessert , who verbally acknowledged these results. Electronically Signed   By: Franki Cabot M.D.   On: 10/23/2016 21:06   Mr Lumbar Spine W Wo Contrast  Result Date: 10/24/2016 CLINICAL DATA:  Acute onset low back pain, hip and RIGHT leg pain. History of prostate cancer. Followup metastasis. EXAM: MRI LUMBAR SPINE WITHOUT AND WITH CONTRAST TECHNIQUE: Multiplanar and multiecho pulse sequences of the lumbar spine were obtained without and with intravenous contrast. CONTRAST:  53mL MULTIHANCE GADOBENATE DIMEGLUMINE 529 MG/ML IV SOLN COMPARISON:  CT lumbar spine October 23, 2016 at 8:39 p.m. FINDINGS: SEGMENTATION: For the purposes of this report, the last well-formed intervertebral disc will be described as L5-S1. ALIGNMENT: Maintenance of the lumbar lordosis. No malalignment. VERTEBRAE:Diffuse osseous metastasis within all included axial skeleton. Tumoral osseous expansion of multiple included thoracic and, lumbar levels. Mild less than 20% T12 and L2 pathologic fractures with generalized low T1 and bright STIR signal favoring acute injury. Disc morphology generally maintained with mild desiccation. Acute L4 inferior endplate Schmorl's node with associated disc edema. Additional acute to old  Schmorl's nodes. CONUS MEDULLARIS: Conus medullaris terminates at L2 and appears normal. No suspicious cord or leptomeningeal enhancement. Central displacement of the cauda equina due to canal stenosis. PARASPINAL AND SOFT TISSUES: Included view of the pelvis demonstrates at least 5.8 cm base of bladder mass, possibly prostatic. Mild paraspinal muscle strain without suspicious enhancement. Asymmetrically atrophic RIGHT psoas muscle. DISC LEVELS: T11-12: Slight tumoral expansion LEFT middle and posterior column. No disc bulge or canal stenosis. Mild facet arthropathy and mild neural foraminal narrowing. T12-L1: 3 mm tumoral expansion posterior T12 vertebral body. Tumoral expansion T12 spinous process and LEFT lamina invading the adjacent paraspinal soft tissues. Mild facet arthropathy and ligamentum flavum redundancy. Mild canal stenosis at L1, no disc bulge or neural foraminal narrowing. L1-2: No disc bulge, canal stenosis. Mild tumoral expansion LEFT L2 posterior elements resulting in mild LEFT neural foraminal narrowing. L2-3: Tumoral expansion L1 spinous process. Tumoral expansion LEFT L3 superior articular facet. No canal stenosis. Moderate LEFT neural foraminal narrowing. L3-4: Mild to moderate facet arthropathy and ligamentum flavum redundancy with trace facet effusions, likely reactive. 8 mm RIGHT facet synovial cyst extending to the paraspinal soft tissues. No disc bulge.  No canal stenosis. No neural foraminal narrowing. L4-5: Small broad-based disc bulge. Severe facet arthropathy and ligamentum flavum redundancy. Bilateral facet effusions, facets are widened to 4 mm. 9 mm enhancing 13 mm synovial cyst extending to the paraspinal soft tissues. Severe canal stenosis. Mild to moderate RIGHT, moderate LEFT neural foraminal narrowing. L5-S1: 6 mm tumoral expansion RIGHT posterior L5 vertebral body effacing the RIGHT lateral recess, displacing the traversing RIGHT L5 nerve. Small L5-S1 broad-based disc bulge.  Severe RIGHT and moderate LEFT facet arthropathy ligamentum flavum redundancy with trace RIGHT facet effusion which is likely reactive. Moderate canal stenosis at L5, no canal stenosis at L5-S1. Moderate to severe bilateral neural foraminal narrowing. IMPRESSION: 1. Diffuse osseous metastasis. Mild acute T12 and L2 pathologic fractures. Tumoral expansion of the T12 posterior elements invading the paraspinal soft tissues. 2. 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 Tumoral expansion RIGHT L5 vertebral body displaces the traversing RIGHT L5 nerve. 3. Multilevel neural foraminal narrowing: Moderate to severe at L5-S1. 4. Base of bladder mass suspicious for prostate invasion and tumor. Electronically Signed   By: Elon Alas M.D.   On: 10/24/2016 00:34      IMPRESSION/PLAN: Today, I talked to the patient about the findings and work-up thus far. We discussed the patient's diagnosis of bone metastases likely due to prostate cancer and general treatment for this, highlighting the role of radiotherapy in the management. We discussed the available radiation techniques, and focused on the details of logistics and delivery.  I recommend a full spine MRI for staging.  I recommend starting with palliative RT to the lower spine, when I believe he is symptomatic.  But, we can consider treating other spinal levels in the future as symptoms warrant. I explained we should be prudent about how much of the spine is treated at once due to risk of side effects, including bone marrow suppression or GI upset.  We discussed the risks, benefits, and side effects of radiotherapy. Side effects may include but not necessarily be limited to: bone marrow suppression, fatigue, skin irritation, GI upset, bladder irritation, organ injury. No guarantees of treatment were given. A consent form was signed and placed in the patient's medical record. The patient was encouraged to ask questions that I answered to the best of my ability. We will simulate  him today. He requests waiting to starting treatment until 2-12. Anticipate 2 weeks of radiotherapy with 3 parent fields, a vac lock for immobilization, and 3D conformal RT to spare kidneys, bladder, bowel.  Rantidine Rx'd today for GI prophylaxis while on Decadron.  __________________________________________   Eppie Gibson, MD

## 2016-10-26 NOTE — Progress Notes (Signed)
  SIMULATION AND TREATMENT PLANNING NOTE  Outpatient  DIAGNOSIS:     ICD-9-CM ICD-10-CM   1. Bone metastases (HCC) 198.5 C79.51     NARRATIVE:  The patient was brought to the [facility].  Identity was confirmed.  All relevant records and images related to the planned course of therapy were reviewed.  The patient freely provided informed written consent to proceed with treatment after reviewing the details related to the planned course of therapy. The consent form was witnessed and verified by the simulation staff.    Then, the patient was set-up in a stable reproducible  supine position for radiation therapy.  A vaclok was custom fitted to the patient's anatomy.  CT images were obtained.  Surface markings were placed.  The CT images were loaded into the planning software.      TREATMENT PLANNING NOTE: Treatment planning then occurred.  The radiation prescription was entered and confirmed.    A total of 4  medically necessary complex treatment devices were fabricated and supervised by me, in the form of vaclok device and 3 fields with MLCs to block bowel, bladder, cord, kidneys. Wedges will be used as needed for dose homogeneity.  MORE FIELDS WITH MLCs MAY BE ADDED IN DOSIMETRY for dose homogeneity.  I have requested :3D conformal radiotherapy which is medically necessary to spare bowel, bladder, cord, kidneys [facility] ordered for these tissues).   The patient will receive 30 Gy in 10 fractions to the L3-S4 spine.  -----------------------------------  Eppie Gibson, MD

## 2016-10-27 DIAGNOSIS — Z510 Encounter for antineoplastic radiation therapy: Secondary | ICD-10-CM | POA: Diagnosis not present

## 2016-10-28 ENCOUNTER — Other Ambulatory Visit: Payer: Self-pay | Admitting: Radiation Therapy

## 2016-10-28 DIAGNOSIS — C7951 Secondary malignant neoplasm of bone: Secondary | ICD-10-CM

## 2016-10-29 ENCOUNTER — Ambulatory Visit (HOSPITAL_BASED_OUTPATIENT_CLINIC_OR_DEPARTMENT_OTHER): Payer: Medicare PPO

## 2016-10-29 ENCOUNTER — Telehealth: Payer: Self-pay | Admitting: Oncology

## 2016-10-29 ENCOUNTER — Ambulatory Visit (HOSPITAL_BASED_OUTPATIENT_CLINIC_OR_DEPARTMENT_OTHER): Payer: Medicare PPO | Admitting: Oncology

## 2016-10-29 ENCOUNTER — Encounter: Payer: Self-pay | Admitting: Medical Oncology

## 2016-10-29 VITALS — BP 158/79 | HR 66 | Temp 98.1°F | Resp 17 | Ht 69.0 in | Wt 268.7 lb

## 2016-10-29 DIAGNOSIS — M898X9 Other specified disorders of bone, unspecified site: Secondary | ICD-10-CM | POA: Diagnosis not present

## 2016-10-29 DIAGNOSIS — C7951 Secondary malignant neoplasm of bone: Secondary | ICD-10-CM

## 2016-10-29 DIAGNOSIS — C61 Malignant neoplasm of prostate: Secondary | ICD-10-CM | POA: Diagnosis not present

## 2016-10-29 NOTE — Progress Notes (Signed)
Reason for Referral: Prostate cancer.   HPI: 76 year old gentleman native of [place] but currently lives in this area and evidence of her last 15 years. He is a gentleman without any significant comorbid conditions with a presumed history of prostate cancer dates back to the mid 90s. The exact details are not clear to me at this time. He was seen by urology in [place] by Dr. Ky Barban for evaluation of hematuria in 2015. At that time he was mentioned to have prostate cancer with a PSA that his rising between 2013 and 2015. His PSA in 2015 was 4.4 and it was 10.5 in 2013. He did have a prostate biopsy at that time which showed a Gleason score 4+3 = 7 on the left side and 3+4 = 7 on the right side. He also had an enlarged prostate about 100 g. CT scans at that time did not show any evidence of metastatic disease. He deferred any definitive therapy at that time and was not seen in any further urological follow-up.   He presented urgently on 10/24/2016 to the emergency department with right-sided hip pain as well as lower back pain. Imaging studies at that time revealed lytic metastasis throughout his lumbar spine as well as the lower thoracic spine. Soft tissue density was noted in the left lower pelvis likely related to his prostate cancer. These were images noted on a CT scan and MRI obtained on 10/23/2016 confirmed these findings. His MRI showed diffuse osseous metastasis with mild acute T12 and L2 pathological fractures. There is a tumor expansion of the T12 posterior element invading into the paraspinal soft tissue. Canal stenosis was noted as well. Base of the bladder mass suspicious for prostate cancer invading the base of the bladder.  He was started on dexamethasone and was evaluated by Dr. Isidore Moos and scheduled to start path radiation therapy next week. Clinically, he reports feeling reasonably well. He does report right-sided hip pain with ambulation but no pain at rest. No lower back pain or  abdominal pain noted. He denied any further hematuria or dysuria. He denied any falls or syncope. He denied any neurological deficits.  He does not report any headaches, blurry vision, peripheral neuropathy. He does not report any chest pain, palpitation, orthopnea or leg edema. He does not report any cough, wheezing or hemoptysis. He is not reporting nausea, vomiting or abdominal pain. He does not report any frequency urgency or hesitancy. He does not report any skeletal complaints. Remaining review of systems unremarkable.   Past Medical History:  Diagnosis Date  . Obesity   . Prostate cancer ([facility]) 1995  :  Past Surgical History:  Procedure Laterality Date  . CYSTOSCOPY  06/13/2014   cystoscopy with transrectal ultrasound biopsy of prostate  :   Current Outpatient Prescriptions:  .  dexamethasone (DECADRON) 4 MG tablet, Take 1 tablet (4 mg total) by mouth 4 (four) times daily. (Patient taking differently: Take 4 mg by mouth 4 (four) times daily. He has not been taking it 4 times daily as directed, but only taking it when having pain. He has been instructed on the correct way to take medicine.), Disp: 40 tablet, Rfl: 0 .  ranitidine (ZANTAC) 150 MG tablet, Take 1 tablet (150 mg total) by mouth 2 (two) times daily. Take while on Dexamethasone to protect stomach., Disp: 60 tablet, Rfl: 3 .  traMADol (ULTRAM) 50 MG tablet, Take 1 tablet (50 mg total) by mouth every 6 (six) hours as needed., Disp: 30 tablet, Rfl: 0:  Allergies  Allergen Reactions  . Cephalexin Other (See Comments)    hallucinations  . Hydrocodone Other (See Comments)    Confusion,my mind doesn't work well   . Morphine Other (See Comments)     Confusion. My mind does not work right  . Flomax [Tamsulosin Hcl] Other (See Comments)    My mind doesn't work well  . Percocet [Oxycodone-Acetaminophen] Nausea Only  :  No family history on file.:  Social History   Social History  . Marital status: Married    Spouse name:  N/A  . Number of children: N/A  . Years of education: N/A   Occupational History  . Not on file.   Social History Main Topics  . Smoking status: Never Smoker  . Smokeless tobacco: Never Used  . Alcohol use No  . Drug use: No  . Sexual activity: Not on file   Other Topics Concern  . Not on file   Social History Narrative  . No narrative on file  :  Pertinent items are noted in HPI.  Exam: Blood pressure (!) 158/79, pulse 66, temperature 98.1 F (36.7 C), temperature source Oral, resp. rate 17, height 5\' 9"  (1.753 m), weight 268 lb 11.2 oz (121.9 kg), SpO2 100 %. General appearance: alert and cooperative Head: Normocephalic, without obvious abnormality Throat: lips, mucosa, and tongue normal; teeth and gums normal Neck: no adenopathy Back: negative Resp: clear to auscultation bilaterally Chest wall: no tenderness Cardio: regular rate and rhythm, S1, S2 normal, no murmur, click, rub or gallop GI: soft, non-tender; bowel sounds normal; no masses,  no organomegaly Extremities: extremities normal, atraumatic, no cyanosis or edema Pulses: 2+ and symmetric Skin: Skin color, texture, turgor normal. No rashes or lesions Lymph nodes: Cervical, supraclavicular, and axillary nodes normal.  CBC    Component Value Date/Time   WBC 4.8 10/23/2016 2105   RBC 5.59 10/23/2016 2105   HGB 13.3 10/23/2016 2119   HCT 39.0 10/23/2016 2119   PLT 189 10/23/2016 2105   MCV 64.6 (L) 10/23/2016 2105   MCH 21.6 (L) 10/23/2016 2105   MCHC 33.5 10/23/2016 2105   RDW 15.5 10/23/2016 2105   LYMPHSABS 1.7 10/23/2016 2105   MONOABS 0.4 10/23/2016 2105   EOSABS 0.0 10/23/2016 2105   BASOSABS 0.0 10/23/2016 2105      Chemistry      Component Value Date/Time   NA 141 10/23/2016 2119   K 4.0 10/23/2016 2119   CL 105 10/23/2016 2119   BUN 10 10/23/2016 2119   CREATININE 1.00 10/23/2016 2119   No results found for: CALCIUM, ALKPHOS, AST, ALT, BILITOT     Ct Lumbar Spine Wo  Contrast  Result Date: 10/23/2016 CLINICAL DATA:  Low back pain progressing for "several weeks" new onset right leg pain/ weakness this am, h/o prostate ca EXAM: CT LUMBAR SPINE WITHOUT CONTRAST TECHNIQUE: Multidetector CT imaging of the lumbar spine was performed without intravenous contrast administration. Multiplanar CT image reconstructions were also generated. COMPARISON:  None. FINDINGS: Segmentation: 5 lumbar type vertebrae. Alignment: Normal. Vertebrae: Mixed sclerotic and lytic lesions, predominantly lytic, affecting each of the visualized vertebral bodies from the T11 through L5 levels. The largest lesions are seen at the T11, L1 and L5 vertebral bodies. Suspect associated soft tissue component extending to the central canal and/or right neural foramen at the L4-5 and L5-S1 levels, most likely source for patient's right-sided radiculopathy. Additional central canal involvement at the other levels cannot be excluded on this exam. Paraspinal and other soft tissues:  Atherosclerotic changes of the infrarenal abdominal aorta. Irregular soft tissue density mass within the left lower pelvis, compatible with given history of prostate cancer. IMPRESSION: IMPRESSION 1. 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 Largest lesions are seen at the T11, L1 and L5 vertebral body levels. Suspected associated soft tissue components extending posteriorly to the central canal and/or right neural foramen at the L4-5 and L5-S1 levels, most likely source for patient's right-sided radiculopathy. Additional levels of central canal encroachment cannot be excluded. Recommend MRI with contrast for further characterization. 2. Soft tissue density mass within the left lower pelvis, presumably patient's known prostate cancer. 3. Aortic atherosclerosis. These results were called by telephone at the time of interpretation on 10/23/2016 at 9:05 pm to Dr. Blanchie Dessert ,  who verbally acknowledged these results. Electronically Signed   By: Franki Cabot M.D.   On: 10/23/2016 21:06   Mr Lumbar Spine W Wo Contrast  Result Date: 10/24/2016 CLINICAL DATA:  Acute onset low back pain, hip and RIGHT leg pain. History of prostate cancer. Followup metastasis. EXAM: MRI LUMBAR SPINE WITHOUT AND WITH CONTRAST TECHNIQUE: Multiplanar and multiecho pulse sequences of the lumbar spine were obtained without and with intravenous contrast. CONTRAST:  49mL MULTIHANCE GADOBENATE DIMEGLUMINE 529 MG/ML IV SOLN COMPARISON:  CT lumbar spine October 23, 2016 at 8:39 p.m. FINDINGS: SEGMENTATION: For the purposes of this report, the last well-formed intervertebral disc will be described as L5-S1. ALIGNMENT: Maintenance of the lumbar lordosis. No malalignment. VERTEBRAE:Diffuse osseous metastasis within all included axial skeleton. Tumoral osseous expansion of multiple included thoracic and, lumbar levels. Mild less than 20% T12 and L2 pathologic fractures with generalized low T1 and bright STIR signal favoring acute injury. Disc morphology generally maintained with mild desiccation. Acute L4 inferior endplate Schmorl's node with associated disc edema. Additional acute to old Schmorl's nodes. CONUS MEDULLARIS: Conus medullaris terminates at L2 and appears normal. No suspicious cord or leptomeningeal enhancement. Central displacement of the cauda equina due to canal stenosis. PARASPINAL AND SOFT TISSUES: Included view of the pelvis demonstrates at least 5.8 cm base of bladder mass, possibly prostatic. Mild paraspinal muscle strain without suspicious enhancement. Asymmetrically atrophic RIGHT psoas muscle. DISC LEVELS: T11-12: Slight tumoral expansion LEFT middle and posterior column. No disc bulge or canal stenosis. Mild facet arthropathy and mild neural foraminal narrowing. T12-L1: 3 mm tumoral expansion posterior T12 vertebral body. Tumoral expansion T12 spinous process and LEFT lamina invading the adjacent  paraspinal soft tissues. Mild facet arthropathy and ligamentum flavum redundancy. Mild canal stenosis at L1, no disc bulge or neural foraminal narrowing. L1-2: No disc bulge, canal stenosis. Mild tumoral expansion LEFT L2 posterior elements resulting in mild LEFT neural foraminal narrowing. L2-3: Tumoral expansion L1 spinous process. Tumoral expansion LEFT L3 superior articular facet. No canal stenosis. Moderate LEFT neural foraminal narrowing. L3-4: Mild to moderate facet arthropathy and ligamentum flavum redundancy with trace facet effusions, likely reactive. 8 mm RIGHT facet synovial cyst extending to the paraspinal soft tissues. No disc bulge. No canal stenosis. No neural foraminal narrowing. L4-5: Small broad-based disc bulge. Severe facet arthropathy and ligamentum flavum redundancy. Bilateral facet effusions, facets are widened to 4 mm. 9 mm enhancing 13 mm synovial cyst extending to the paraspinal soft tissues. Severe canal stenosis. Mild to moderate RIGHT, moderate LEFT neural foraminal narrowing. L5-S1: 6 mm tumoral expansion RIGHT posterior L5 vertebral body effacing the RIGHT lateral recess, displacing the traversing RIGHT L5 nerve. Small L5-S1 broad-based disc bulge. Severe  RIGHT and moderate LEFT facet arthropathy ligamentum flavum redundancy with trace RIGHT facet effusion which is likely reactive. Moderate canal stenosis at L5, no canal stenosis at L5-S1. Moderate to severe bilateral neural foraminal narrowing. IMPRESSION: 1. Diffuse osseous metastasis. Mild acute T12 and L2 pathologic fractures. Tumoral expansion of the T12 posterior elements invading the paraspinal soft tissues. 2. 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 Tumoral expansion RIGHT L5 vertebral body displaces the traversing RIGHT L5 nerve. 3. Multilevel neural foraminal narrowing: Moderate to severe at L5-S1. 4. Base of bladder mass suspicious for prostate invasion and tumor. Electronically Signed   By: Elon Alas M.D.   On: 10/24/2016  00:34    Assessment and Plan:    76 year old gentleman with the following issues:  1. Prostate cancer: the exact initial diagnosis is unclear to me that he has known about it since the mid 41s. His most recent prostate biopsy was done in 2015 by Dr. Ky Barban on 06/13/2014. At that time his PSA was 4.9 and he was found to have a Gleason score 4+3 = 7. He had an enlarged prostate and 100 g. He did not receive any definitive therapy at that time and have not had further urological follow-up.  He presented in February 2018 with back and right-sided hip pain with diffuse metastasis and his lumbar and thoracic spine. He is under evaluation to start radiation therapy for palliative purposes in the near future.  The natural course of this disease was reviewed with the patient and his friend that accompanied him today. I explained to him that he likely has developed prostate cancer with metastasis to the bone which is an incurable malignancy. Palliative treatments do exist in the form of radiation therapy to palliate his pain, androgen deprivation therapy to control his disease and other options include second line hormonal therapy, chemotherapy among others. The goals of any disease therapy is to palliate his cancer and will not provide any curative options.  He understands without those therapies, the cancer will continue to spread and will insect more morbidity including pain, fatigue, weight loss and potentially neurological damage and paralysis. With therapy, his symptoms can be palliated and his overall survival can be prolonged.  He is unclear whether he wants to proceed with any additional therapy other than radiation. He is agreeable to have a PSA repeated which we will do today. I will also arrange for him to have a bone scan to complete his staging workup.  Risks and benefits of androgen deprivation therapy were reviewed today. Complications such as hot flashes, fatigue and weight gain among others  were reviewed. He is not clear whether he wants to proceed with this therapy or not. He will consider it before the next visit.  2. Bone pain: Appears to be manageable currently with dexamethasone and ultram.  Radiation therapy would help his symptoms.  3. Bone directed therapy: He is a good candidate for Xgeva but this topic has not been discussed at this time and will be discussed in future visits.  4. Follow-up: Will be in the next few weeks to follow-up on his clinical status.

## 2016-10-29 NOTE — Telephone Encounter (Signed)
Appointments scheduled per 2/9 LOS. Patient given AVS report and calendars with future scheduled appointments.  °

## 2016-10-29 NOTE — Progress Notes (Signed)
I introduced myself to Mr. Gregory Chaney and his neighbor  as the prostate navigator and my role. I provided them with my business card and asked them to call me with questions and concerns. He states that he was doing well until he developed pain and numbness down his right leg and went the  ER last weekend. He was told he has cancer in his spine. He saw Dr. Isidore Moos earlier this week and is scheduled to begin radiation treatments 11/01/16. I will continue to follow.

## 2016-10-30 ENCOUNTER — Ambulatory Visit (HOSPITAL_COMMUNITY)
Admission: RE | Admit: 2016-10-30 | Discharge: 2016-10-30 | Disposition: A | Discharge status: Home / Self Care | Payer: Medicare PPO | Source: Ambulatory Visit | Attending: Radiation Oncology | Admitting: Radiation Oncology

## 2016-10-30 ENCOUNTER — Ambulatory Visit (HOSPITAL_COMMUNITY): Admission: RE | Admit: 2016-10-30 | Payer: Medicare PPO | Source: Ambulatory Visit

## 2016-10-30 DIAGNOSIS — M47812 Spondylosis without myelopathy or radiculopathy, cervical region: Secondary | ICD-10-CM | POA: Diagnosis not present

## 2016-10-30 DIAGNOSIS — C7951 Secondary malignant neoplasm of bone: Secondary | ICD-10-CM | POA: Insufficient documentation

## 2016-10-30 DIAGNOSIS — M48061 Spinal stenosis, lumbar region without neurogenic claudication: Secondary | ICD-10-CM | POA: Insufficient documentation

## 2016-10-30 DIAGNOSIS — M8448XA Pathological fracture, other site, initial encounter for fracture: Secondary | ICD-10-CM | POA: Insufficient documentation

## 2016-10-30 LAB — TESTOSTERONE: TESTOSTERONE: 286 ng/dL (ref 264–916)

## 2016-10-30 LAB — PSA: PROSTATE SPECIFIC AG, SERUM: 195.5 ng/mL — AB (ref 0.0–4.0)

## 2016-10-30 MED ORDER — GADOBENATE DIMEGLUMINE 529 MG/ML IV SOLN
20.0000 mL | Freq: Once | INTRAVENOUS | Status: AC | PRN
  Administered 2016-10-30: 20 mL via INTRAVENOUS

## 2016-11-01 ENCOUNTER — Ambulatory Visit
Admission: RE | Admit: 2016-11-01 | Discharge: 2016-11-01 | Disposition: A | Discharge status: Home / Self Care | Payer: Medicare PPO | Source: Ambulatory Visit | Attending: Radiation Oncology | Admitting: Radiation Oncology

## 2016-11-01 ENCOUNTER — Encounter: Payer: Self-pay | Admitting: Radiation Oncology

## 2016-11-01 VITALS — BP 155/90 | HR 62 | Temp 98.5°F | Ht 69.0 in | Wt 263.4 lb

## 2016-11-01 DIAGNOSIS — C7951 Secondary malignant neoplasm of bone: Secondary | ICD-10-CM

## 2016-11-01 DIAGNOSIS — Z510 Encounter for antineoplastic radiation therapy: Secondary | ICD-10-CM | POA: Diagnosis not present

## 2016-11-01 MED ORDER — DEXAMETHASONE 4 MG PO TABS: 4.0000 mg | ORAL_TABLET | Freq: Four times a day (QID) | ORAL | 1 refills | Status: DC

## 2016-11-01 NOTE — Progress Notes (Signed)
Gregory Chaney presents for his first radiation treatment to his L- spine. He reports pain in his Right Leg when ambulating. He tells me he is taking decadron and ultram as directed. He is doing well otherwise.   BP (!) 155/90   Pulse 62   Temp 98.5 F (36.9 C)   Ht 5\' 9"  (1.753 m)   Wt 263 lb 6.4 oz (119.5 kg)   SpO2 100% Comment: room air  BMI 38.90 kg/m    Wt Readings from Last 3 Encounters:  11/01/16 263 lb 6.4 oz (119.5 kg)  10/29/16 268 lb 11.2 oz (121.9 kg)  10/30/16 260 lb (117.9 kg)

## 2016-11-01 NOTE — Progress Notes (Signed)
   Weekly Management Note:  Outpatient    ICD-9-CM ICD-10-CM   1. Bone metastases ([facility]) 198.5 C79.51     Current Dose:  3 Gy  Projected Dose: 30 Gy   Narrative:  The patient presents for routine under treatment assessment.  CBCT/MVCT images/Port film x-rays were reviewed.  The chart was checked. He was reviewed at tumor board ([facility]) today.  Spine MRI shows multilevel disease. Consider treating upper T-L spine, ie T3-L2, in the future.  Right now he is asymptomatic in that area.  Tolerated first Fraction to lower L and S spine well today.  Continue right leg pain.  Taking steroids.  Physical Findings:  Wt Readings from Last 3 Encounters:  11/01/16 263 lb 6.4 oz (119.5 kg)  10/29/16 268 lb 11.2 oz (121.9 kg)  10/30/16 260 lb (117.9 kg)    height is 5\' 9"  (1.753 m) and weight is 263 lb 6.4 oz (119.5 kg). His temperature is 98.5 F (36.9 C). His blood pressure is 155/90 (abnormal) and his pulse is 62. His oxygen saturation is 100%.   NAD In wheelchair  CBC    Component Value Date/Time   WBC 4.8 10/23/2016 2105   RBC 5.59 10/23/2016 2105   HGB 13.3 10/23/2016 2119   HCT 39.0 10/23/2016 2119   PLT 189 10/23/2016 2105   MCV 64.6 (L) 10/23/2016 2105   MCH 21.6 (L) 10/23/2016 2105   MCHC 33.5 10/23/2016 2105   RDW 15.5 10/23/2016 2105   LYMPHSABS 1.7 10/23/2016 2105   MONOABS 0.4 10/23/2016 2105   EOSABS 0.0 10/23/2016 2105   BASOSABS 0.0 10/23/2016 2105     CMP     Component Value Date/Time   NA 141 10/23/2016 2119   K 4.0 10/23/2016 2119   CL 105 10/23/2016 2119   GLUCOSE 99 10/23/2016 2119   BUN 10 10/23/2016 2119   CREATININE 1.00 10/23/2016 2119     Impression:  The patient is tolerating radiotherapy.   Plan:  Continue radiotherapy as planned. Taper plan for steroids written and Rx'd - given to nursing for patient education later this week.  -----------------------------------  Eppie Gibson, MD

## 2016-11-02 ENCOUNTER — Ambulatory Visit
Admission: RE | Admit: 2016-11-02 | Discharge: 2016-11-02 | Disposition: A | Discharge status: Home / Self Care | Payer: Medicare PPO | Source: Ambulatory Visit | Attending: Radiation Oncology | Admitting: Radiation Oncology

## 2016-11-02 DIAGNOSIS — Z510 Encounter for antineoplastic radiation therapy: Secondary | ICD-10-CM | POA: Diagnosis not present

## 2016-11-02 NOTE — Progress Notes (Signed)
I spoke with Gregory Chaney before his radiation treatment today regarding a decadron taper prescribed by Dr. Isidore Moos. I gave him the script to bring to his pharmacy to get filled. I also spent time going over the instructions regarding the taper. I wrote out each step and the dates they were to occur. He voiced his understanding of the instructions and I wrote down my contact number if he had any further questions.

## 2016-11-03 ENCOUNTER — Ambulatory Visit
Admission: RE | Admit: 2016-11-03 | Discharge: 2016-11-03 | Disposition: A | Discharge status: Home / Self Care | Payer: Medicare PPO | Source: Ambulatory Visit | Attending: Radiation Oncology | Admitting: Radiation Oncology

## 2016-11-03 DIAGNOSIS — Z510 Encounter for antineoplastic radiation therapy: Secondary | ICD-10-CM | POA: Diagnosis not present

## 2016-11-04 ENCOUNTER — Ambulatory Visit
Admission: RE | Admit: 2016-11-04 | Discharge: 2016-11-04 | Disposition: A | Discharge status: Home / Self Care | Payer: Medicare PPO | Source: Ambulatory Visit | Attending: Radiation Oncology | Admitting: Radiation Oncology

## 2016-11-04 DIAGNOSIS — Z510 Encounter for antineoplastic radiation therapy: Secondary | ICD-10-CM | POA: Diagnosis not present

## 2016-11-05 ENCOUNTER — Ambulatory Visit
Admission: RE | Admit: 2016-11-05 | Discharge: 2016-11-05 | Disposition: A | Discharge status: Home / Self Care | Payer: Medicare PPO | Source: Ambulatory Visit | Attending: Radiation Oncology | Admitting: Radiation Oncology

## 2016-11-05 ENCOUNTER — Encounter: Payer: Self-pay | Admitting: *Deleted

## 2016-11-05 DIAGNOSIS — Z510 Encounter for antineoplastic radiation therapy: Secondary | ICD-10-CM | POA: Diagnosis not present

## 2016-11-05 NOTE — Progress Notes (Signed)
[place] Psychosocial Distress Screening Clinical Social Work  Clinical Social Work was referred by distress screening protocol.  The patient scored a 6 on the Psychosocial Distress Thermometer which indicates moderate distress. Clinical Social Worker contacted patient by phone to assess for distress and other psychosocial needs. Mr. Slee shared he is coping well with diagnosis/treatment, has no information concerns after meeting with the oncologist, and has no other concerns.  CSW briefly shared CSW role and encouraged patient to follow up with CSW if needed.  ONCBCN DISTRESS SCREENING 10/26/2016  Screening Type Initial Screening  Distress experienced in past week (1-10) 6  Emotional problem type Adjusting to illness  Information Concerns Type Lack of info about diagnosis;Lack of info about treatment;Lack of info about complementary therapy choices;Lack of info about maintaining fitness  Physical Problem type Pain;Getting around;Constipation/diarrhea  Physician notified of physical symptoms Yes    Polo Riley, MSW, LCSW, OSW-C Clinical Social Worker [facility] [phone]

## 2016-11-08 ENCOUNTER — Ambulatory Visit
Admission: RE | Admit: 2016-11-08 | Discharge: 2016-11-08 | Disposition: A | Discharge status: Home / Self Care | Payer: Medicare PPO | Source: Ambulatory Visit | Attending: Radiation Oncology | Admitting: Radiation Oncology

## 2016-11-08 DIAGNOSIS — Z510 Encounter for antineoplastic radiation therapy: Secondary | ICD-10-CM | POA: Diagnosis not present

## 2016-11-08 MED ORDER — TRAMADOL HCL 50 MG PO TABS: 50.0000 mg | ORAL_TABLET | Freq: Four times a day (QID) | ORAL | 0 refills | Status: DC | PRN

## 2016-11-09 ENCOUNTER — Ambulatory Visit
Admission: RE | Admit: 2016-11-09 | Discharge: 2016-11-09 | Disposition: A | Discharge status: Home / Self Care | Payer: Medicare PPO | Source: Ambulatory Visit | Attending: Radiation Oncology | Admitting: Radiation Oncology

## 2016-11-09 ENCOUNTER — Encounter: Payer: Self-pay | Admitting: Radiation Oncology

## 2016-11-09 VITALS — BP 139/75 | HR 69 | Temp 98.1°F | Ht 69.0 in | Wt 257.4 lb

## 2016-11-09 DIAGNOSIS — Z510 Encounter for antineoplastic radiation therapy: Secondary | ICD-10-CM | POA: Diagnosis not present

## 2016-11-09 DIAGNOSIS — C7951 Secondary malignant neoplasm of bone: Secondary | ICD-10-CM

## 2016-11-09 NOTE — Progress Notes (Signed)
Department of Radiation Oncology  Phone:  [phone] Fax:        [phone]  Weekly Treatment Note    Name: Gregory Chaney Date: 11/09/2016 MRN: [id number] DOB: April 26, 1941   Diagnosis:     ICD-9-CM ICD-10-CM   1. Bone metastases ([facility]) 198.5 C79.51      Current dose: 21 Gy  Current fraction: 7   MEDICATIONS: Current Outpatient Prescriptions  Medication Sig Dispense Refill  . dexamethasone (DECADRON) 4 MG tablet Take 1 tablet (4 mg total) by mouth 4 (four) times daily. On 2-14 taper to 1 tab TID.  On 2-21 taper to 1 tab BID.  On 3-7 taper to 1 tab daily. On 3-21 taper to 1/2 tab daily.  On 3-28 taper to 1/2 tab every other day. Continue this until 4-11, then stop. 40 tablet 1  . ranitidine (ZANTAC) 150 MG tablet Take 1 tablet (150 mg total) by mouth 2 (two) times daily. Take while on Dexamethasone to protect stomach. 60 tablet 3  . traMADol (ULTRAM) 50 MG tablet Take 1 tablet (50 mg total) by mouth every 6 (six) hours as needed. 40 tablet 0   No current facility-administered medications for this encounter.      ALLERGIES: Cephalexin; Hydrocodone; Morphine; Flomax [tamsulosin hcl]; and Percocet [oxycodone-acetaminophen]   LABORATORY DATA:  Lab Results  Component Value Date   WBC 4.8 10/23/2016   HGB 13.3 10/23/2016   HCT 39.0 10/23/2016   MCV 64.6 (L) 10/23/2016   PLT 189 10/23/2016   Lab Results  Component Value Date   NA 141 10/23/2016   K 4.0 10/23/2016   CL 105 10/23/2016   No results found for: ALT, AST, GGT, ALKPHOS, BILITOT   NARRATIVE: Gregory Chaney was seen today for weekly treatment management. The chart was checked and the patient's films were reviewed.  The patient states he is doing well overall. Improved pain. He denies any nausea, GI issues otherwise.  Gregory Chaney presents for his 7th fraction of radiation to his L-spine. He denies pain at this time. He is taking a decadron taper. He is also using ultram 50 mg twice daily. He feels very encouraged  by his pain control at this time and feels like he is walking faster than before starting radiation. He is scheduled to see Dr. Alen Blew soon for evaluation of his prostate cancer, and has verbalized to me today that he does not plan to have a scheduled bone scan performed before this appointment. He does not want to have anything else done at this time for his cancer. I will notify Dr. Hazeline Junker nurse of his statement.   BP 139/75   Pulse 69   Temp 98.1 F (36.7 C)   Ht 5\' 9"  (1.753 m)   Wt 257 lb 6.4 oz (116.8 kg)   SpO2 100% Comment: room air  BMI 38.01 kg/m    Wt Readings from Last 3 Encounters:  11/09/16 257 lb 6.4 oz (116.8 kg)  11/01/16 263 lb 6.4 oz (119.5 kg)  10/29/16 268 lb 11.2 oz (121.9 kg)    PHYSICAL EXAMINATION: height is 5\' 9"  (1.753 m) and weight is 257 lb 6.4 oz (116.8 kg). His temperature is 98.1 F (36.7 C). His blood pressure is 139/75 and his pulse is 69. His oxygen saturation is 100%.        ASSESSMENT: The patient is doing satisfactorily with treatment.  PLAN: We will continue with the patient's radiation treatment as planned.  He will follow-up with Dr. Isidore Moos in one  month. We will further see medical oncology as well although he states today that he does not wish additional treatment.

## 2016-11-09 NOTE — Progress Notes (Signed)
Gregory Chaney presents for his 7th fraction of radiation to his L-spine. He denies pain at this time. He is taking a decadron taper. He is also using ultram 50 mg twice daily. He feels very encouraged by his pain control at this time and feels like he is walking faster than before starting radiation. He is scheduled to see Dr. Alen Blew soon for evaluation of his prostate cancer, and has verbalized to me today that he does not plan to have a scheduled bone scan performed before this appointment. He does not want to have anything else done at this time for his cancer. I will notify Dr. Hazeline Junker nurse of his statement.   BP 139/75   Pulse 69   Temp 98.1 F (36.7 C)   Ht 5\' 9"  (1.753 m)   Wt 257 lb 6.4 oz (116.8 kg)   SpO2 100% Comment: room air  BMI 38.01 kg/m    Wt Readings from Last 3 Encounters:  11/09/16 257 lb 6.4 oz (116.8 kg)  11/01/16 263 lb 6.4 oz (119.5 kg)  10/29/16 268 lb 11.2 oz (121.9 kg)

## 2016-11-10 ENCOUNTER — Ambulatory Visit
Admission: RE | Admit: 2016-11-10 | Discharge: 2016-11-10 | Disposition: A | Discharge status: Home / Self Care | Payer: Medicare PPO | Source: Ambulatory Visit | Attending: Radiation Oncology | Admitting: Radiation Oncology

## 2016-11-10 DIAGNOSIS — Z510 Encounter for antineoplastic radiation therapy: Secondary | ICD-10-CM | POA: Diagnosis not present

## 2016-11-11 ENCOUNTER — Ambulatory Visit
Admission: RE | Admit: 2016-11-11 | Discharge: 2016-11-11 | Disposition: A | Discharge status: Home / Self Care | Payer: Medicare PPO | Source: Ambulatory Visit | Attending: Radiation Oncology | Admitting: Radiation Oncology

## 2016-11-11 DIAGNOSIS — Z510 Encounter for antineoplastic radiation therapy: Secondary | ICD-10-CM | POA: Diagnosis not present

## 2016-11-12 ENCOUNTER — Ambulatory Visit
Admission: RE | Admit: 2016-11-12 | Discharge: 2016-11-12 | Disposition: A | Discharge status: Home / Self Care | Payer: Medicare PPO | Source: Ambulatory Visit | Attending: Radiation Oncology | Admitting: Radiation Oncology

## 2016-11-12 DIAGNOSIS — Z510 Encounter for antineoplastic radiation therapy: Secondary | ICD-10-CM | POA: Diagnosis not present

## 2016-11-18 ENCOUNTER — Encounter (HOSPITAL_COMMUNITY): Payer: Medicare PPO

## 2016-11-22 ENCOUNTER — Ambulatory Visit: Payer: Medicare PPO | Admitting: Oncology

## 2016-11-26 ENCOUNTER — Encounter: Payer: Self-pay | Admitting: Radiation Oncology

## 2016-11-26 NOTE — Progress Notes (Signed)
  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 During treatment, the patient reported well controlled pain. He reports improved ambulation with treatment.  Plan: The patient has completed radiation treatment. Dexamethasone taper initiated.  The patient will return to radiation oncology clinic for routine followup in one month. I advised them to call or return sooner if they have any questions or concerns related to their recovery or treatment.  -----------------------------------  Eppie Gibson, MD  This document serves as a record of services personally performed by Eppie Gibson, MD. It was created on her behalf by Bethann Humble, a trained medical scribe. The creation of this record is based on the scribe's personal observations and the provider's statements to them. This document has been checked and approved by the attending provider.

## 2016-12-08 ENCOUNTER — Encounter: Payer: Self-pay | Admitting: Radiation Oncology

## 2016-12-10 ENCOUNTER — Encounter: Payer: Self-pay | Admitting: Radiation Oncology

## 2016-12-10 ENCOUNTER — Ambulatory Visit
Admission: RE | Admit: 2016-12-10 | Discharge: 2016-12-10 | Disposition: A | Discharge status: Home / Self Care | Payer: Medicare PPO | Source: Ambulatory Visit | Attending: Radiation Oncology | Admitting: Radiation Oncology

## 2016-12-10 DIAGNOSIS — Z881 Allergy status to other antibiotic agents status: Secondary | ICD-10-CM | POA: Insufficient documentation

## 2016-12-10 DIAGNOSIS — Z888 Allergy status to other drugs, medicaments and biological substances status: Secondary | ICD-10-CM | POA: Insufficient documentation

## 2016-12-10 DIAGNOSIS — C7951 Secondary malignant neoplasm of bone: Secondary | ICD-10-CM | POA: Diagnosis present

## 2016-12-10 DIAGNOSIS — Z923 Personal history of irradiation: Secondary | ICD-10-CM | POA: Diagnosis not present

## 2016-12-10 DIAGNOSIS — Z885 Allergy status to narcotic agent status: Secondary | ICD-10-CM | POA: Insufficient documentation

## 2016-12-10 DIAGNOSIS — Z8546 Personal history of malignant neoplasm of prostate: Secondary | ICD-10-CM | POA: Diagnosis not present

## 2016-12-10 HISTORY — DX: Personal history of irradiation: Z92.3

## 2016-12-10 NOTE — Progress Notes (Signed)
Radiation Oncology         [phone] ________________________________  Name: Gregory Chaney MRN: [id number]  Date: 12/10/2016  DOB: 08-06-1941  Follow-Up Visit  Note  Outpatient  CC: [name], NP  Heath Lark, MD Zola Button MD  Diagnosis and Prior Radiotherapy:    ICD-9-CM ICD-10-CM   1. 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   On review of systems, the patient reports occasional pain in his right upper leg and hip area.  This improved significantly since starting RT.  He denies any difficulty urinating. He denies bowel concerns. The patient reports his energy level is improving. The patient is currently taking Decadron 4 mg once daily, and plans to taper to 1/2 tablet on 3/26. He uses a cane to ambulate.  He did not show for his med onc appt with Dr Alen Blew and this was not rescheduled.   ALLERGIES:  is allergic to cephalexin; hydrocodone; morphine; flomax [tamsulosin hcl]; and percocet [oxycodone-acetaminophen].  Meds: Current Outpatient Prescriptions  Medication Sig Dispense Refill  . dexamethasone (DECADRON) 4 MG tablet Take 1 tablet (4 mg total) by mouth 4 (four) times daily. On 2-14 taper to 1 tab TID.  On 2-21 taper to 1 tab BID.  On 3-7 taper to 1 tab daily. On 3-21 taper to 1/2 tab daily.  On 3-28 taper to 1/2 tab every other day. Continue this until 4-11, then stop. 40 tablet 1  . naproxen sodium (ANAPROX) 220 MG tablet Take 220 mg by mouth 2 (two) times daily with a meal.    . ranitidine (ZANTAC) 150 MG tablet Take 1 tablet (150 mg total) by mouth 2 (two) times daily. Take while on Dexamethasone to protect stomach. (Patient not taking: Reported on 12/10/2016) 60  tablet 3  . traMADol (ULTRAM) 50 MG tablet Take 1 tablet (50 mg total) by mouth every 6 (six) hours as needed. (Patient not taking: Reported on 12/10/2016) 40 tablet 0   No current facility-administered medications for this encounter.     Physical Findings: The patient is in no acute distress. Patient is alert and oriented.  height is 5\' 9"  (1.753 m) and weight is 267 lb (121.1 kg). His oral temperature is 97.6 F (36.4 C). His blood pressure is 131/91 (abnormal) and his pulse is 72. His oxygen saturation is 100%.   No oral thrush noted. Hip flexion is symmetric and intact bilaterally. Over his lower back there is no residual skin irritation.    Lab Findings: Lab Results  Component Value Date   WBC 4.8 10/23/2016   HGB 13.3 10/23/2016   HCT 39.0 10/23/2016   MCV 64.6 (L) 10/23/2016   PLT 189 10/23/2016    Radiographic Findings: No results found.  Impression/Plan:  The patient is ambulatory, and with good palliation from radiotherapy to the lower spine.  We reviewed the patient's steroid taper. He understands how to continue to take the medication. The patient will continue to take Ranitidine while taking steroids.  The patient cancelled his appointment with Dr. Alen Blew against medical advice. He expresses a desire to minimize interventions for his metastatic cancer. I encouraged the patient to reschedule this appointment to follow with Dr. Alen Blew, especially as the  patient does not currently follow with a PCP. I advised the patient that following with medical oncology does not mean the patient must have chemotherapy. He is in agreement. Our secretary Enid Derry took down his phone number so she can call him with a new appt once she connects with medical oncology's scheduler.  The patient will follow up with me as needed.  _____________________________________   Eppie Gibson, MD  This document serves as a record of services personally performed by Eppie Gibson, MD. It was created on  her behalf by Maryla Morrow, a trained medical scribe. The creation of this record is based on the scribe's personal observations and the provider's statements to them. This document has been checked and approved by the attending provider.

## 2016-12-10 NOTE — Progress Notes (Addendum)
Gregory Chaney is here for follow up after treatment to his lower spine.  He reports having occasional pain in his right upper leg/hip area that he thinks is due to his mattress not being firm.  He continues to use a cane to ambulate.  He is currently taking decadron 4 mg once a day and will taper to half tablet on Monday.  He denies having any trouble urinating and denies having any bowel issues.  He reports his energy level is improving.  BP (!) 131/91 (BP Location: Right Arm, Patient Position: Sitting)   Pulse 72   Temp 97.6 F (36.4 C) (Oral)   Ht 5\' 9"  (1.753 m)   Wt 267 lb (121.1 kg)   SpO2 100%   BMI 39.43 kg/m    Wt Readings from Last 3 Encounters:  12/10/16 267 lb (121.1 kg)  11/09/16 257 lb 6.4 oz (116.8 kg)  11/01/16 263 lb 6.4 oz (119.5 kg)

## 2016-12-14 ENCOUNTER — Telehealth: Payer: Self-pay | Admitting: *Deleted

## 2016-12-14 NOTE — Telephone Encounter (Signed)
CALLED PATIENT TO INFORM OF FU VISIT WITH DR. SHADAD ON 01-04-17- ARRIVAL TIME - 8:45 AM, SPOKE WITH PATIENT AND HE IS AWARE OF THIS APPT.

## 2017-01-04 ENCOUNTER — Ambulatory Visit: Payer: Medicare PPO | Admitting: Oncology

## 2017-03-01 ENCOUNTER — Telehealth: Payer: Self-pay

## 2017-03-01 NOTE — Telephone Encounter (Signed)
Mr. Peckham called me and reported new/increased pain to his back and both legs. He also reported intermittent hematuria. He asked me to make Dr. Isidore Moos aware of his new symptoms. Dr. Isidore Moos was made aware and she recommends assessment by Dr. Alen Blew (medical oncologist). Mr. Howk was previously scheduled to see Dr. Alen Blew but did not keep the appointment. Dr. Alen Blew and his office were made aware of this request. I called to let Mr. Tench know he should be receiving a call from Dr. Hazeline Junker office with an appointment in the near future. He voiced his understanding and knows to call me if he has any further questions or concerns.

## 2017-03-03 ENCOUNTER — Telehealth: Payer: Self-pay | Admitting: Oncology

## 2017-03-03 ENCOUNTER — Encounter (HOSPITAL_COMMUNITY): Payer: Self-pay

## 2017-03-03 ENCOUNTER — Emergency Department (HOSPITAL_COMMUNITY): Payer: Medicare PPO

## 2017-03-03 ENCOUNTER — Emergency Department (HOSPITAL_COMMUNITY)
Admission: EM | Admit: 2017-03-03 | Discharge: 2017-03-03 | Disposition: A | Discharge status: Home / Self Care | Payer: Medicare PPO | Attending: Emergency Medicine | Admitting: Emergency Medicine

## 2017-03-03 DIAGNOSIS — Y929 Unspecified place or not applicable: Secondary | ICD-10-CM | POA: Diagnosis not present

## 2017-03-03 DIAGNOSIS — S2232XA Fracture of one rib, left side, initial encounter for closed fracture: Secondary | ICD-10-CM | POA: Diagnosis not present

## 2017-03-03 DIAGNOSIS — Y999 Unspecified external cause status: Secondary | ICD-10-CM | POA: Diagnosis not present

## 2017-03-03 DIAGNOSIS — S299XXA Unspecified injury of thorax, initial encounter: Secondary | ICD-10-CM | POA: Diagnosis present

## 2017-03-03 DIAGNOSIS — M546 Pain in thoracic spine: Secondary | ICD-10-CM | POA: Diagnosis not present

## 2017-03-03 DIAGNOSIS — X58XXXA Exposure to other specified factors, initial encounter: Secondary | ICD-10-CM | POA: Diagnosis not present

## 2017-03-03 DIAGNOSIS — Y939 Activity, unspecified: Secondary | ICD-10-CM | POA: Diagnosis not present

## 2017-03-03 DIAGNOSIS — Z8546 Personal history of malignant neoplasm of prostate: Secondary | ICD-10-CM | POA: Insufficient documentation

## 2017-03-03 MED ORDER — FENTANYL CITRATE (PF) 100 MCG/2ML IJ SOLN
50.0000 ug | Freq: Once | INTRAMUSCULAR | Status: AC
  Administered 2017-03-03: 50 ug via INTRAVENOUS
  Filled 2017-03-03: qty 2

## 2017-03-03 MED ORDER — TRAMADOL HCL 50 MG PO TABS: 50.0000 mg | ORAL_TABLET | Freq: Four times a day (QID) | ORAL | 0 refills | Status: DC | PRN

## 2017-03-03 NOTE — Discharge Instructions (Signed)
Take your pain medication as prescribed as needed for pain relief. Continue taking your home medications as prescribed. I recommend using your intensive spirometer at least 4-5 times daily for the next few weeks to help prevent pneumonia until your rib fracture has healed. I recommend calling your oncologist office tomorrow morning to confirm your scheduled appointment on Monday. It is important to follow up with them for follow-up evaluation regarding your left rib fracture and further management of your metastatic prostate cancer. Return to the emergency department if symptoms worsen or new onset of fever, headache, dizziness, confusion, chest pain, difficulty breathing, coughing up blood, abdominal pain, vomiting, numbness, weakness, syncope, loss of control of bowel or bladder, groin numbness.

## 2017-03-03 NOTE — ED Notes (Signed)
Pitting edema in legs bilaterally from toes to knees. Pt reports he has had swelling in his legs for one month.

## 2017-03-03 NOTE — Telephone Encounter (Signed)
lvm to inform pt of 6/25 appt at 10 am per sch msg

## 2017-03-03 NOTE — ED Triage Notes (Signed)
Patient complains of generalized back pain x 3 weeks. States he has talked to his doctor about same and no diagnosis. Pain with with any ambulation. Denies trauma, appears uncomfortable on arrival

## 2017-03-03 NOTE — ED Notes (Signed)
Patient transported to X-ray 

## 2017-03-03 NOTE — ED Provider Notes (Signed)
[facility] DEPT Provider Note   CSN: [id number] Arrival date & time: 03/03/17  1408     History   Chief Complaint No chief complaint on file.   HPI Gregory Chaney is a 76 y.o. male.  HPI   Pt is a 76 yo male with PMH of stage IV prostate cancer metastatic to bone (s/p radiation tx, last tx in Feb 2018) who presents to the ED with complaint of worsening left-sided back pain. Patient reports having pain to the side of his left mid back that has been present for the past 3 months since receiving his last treatment of radiation. He notes the pain significantly worsen last night while he was lain in bed. Denies any recent fall, trauma or injury. Patient reports pain is worse with palpation, bending or certain movements. He states he has been taking Aleve at home with mild intermittent relief. Patient denies any midline spinal pain or low back pain. He notes he has felt more fatigued and has a decreased appetite but notes this is unchanged since being dx with cancer. Pt denies fever, chills, night sweats, HA, lightheadedness, CP, SOB, abdominal pain, vomiting,  numbness, tingling, saddle anesthesia, loss of bowel or bladder, weakness. Pt notes he has an appointment scheduled with his oncologist on Monday (03/07/17) but states the pain became so severe last night resulting in him coming to the ED. Reports hx of severe allergy to Percocet. Pt reports using a cane to ambulate at baseline.   PCP- Dr. Noberto Retort Med Onc- Dr. Alen Blew Rad Onc- Dr. Isidore Moos  Past Medical History:  Diagnosis Date  . History of radiation therapy 11/01/16- 11/12/16   Lower spine L3- S4/ 30 Gy in 10 fractions  . Obesity   . Prostate cancer [facility]) 1995    Patient Active Problem List   Diagnosis Date Noted  . Bone metastases ([place]) 10/26/2016    Past Surgical History:  Procedure Laterality Date  . CYSTOSCOPY  06/13/2014   cystoscopy with transrectal ultrasound biopsy of prostate       Home Medications    Prior to  Admission medications   Medication Sig Start Date End Date Taking? Authorizing Provider  dexamethasone (DECADRON) 4 MG tablet Take 1 tablet (4 mg total) by mouth 4 (four) times daily. On 2-14 taper to 1 tab TID.  On 2-21 taper to 1 tab BID.  On 3-7 taper to 1 tab daily. On 3-21 taper to 1/2 tab daily.  On 3-28 taper to 1/2 tab every other day. Continue this until 4-11, then stop. 11/01/16   Eppie Gibson, MD  naproxen sodium (ANAPROX) 220 MG tablet Take 220 mg by mouth 2 (two) times daily with a meal.    [provider]  ranitidine (ZANTAC) 150 MG tablet Take 1 tablet (150 mg total) by mouth 2 (two) times daily. Take while on Dexamethasone to protect stomach. Patient not taking: Reported on 12/10/2016 10/26/16   Eppie Gibson, MD  traMADol (ULTRAM) 50 MG tablet Take 1 tablet (50 mg total) by mouth every 6 (six) hours as needed. 03/03/17   Nona Dell, PA-C    Family History No family history on file.  Social History Social History  Substance Use Topics  . Smoking status: Never Smoker  . Smokeless tobacco: Never Used  . Alcohol use No     Allergies   Cephalexin; Hydrocodone; Morphine; Flomax [tamsulosin hcl]; and Percocet [oxycodone-acetaminophen]   Review of Systems Review of Systems  Constitutional: Positive for appetite change (decreased) and fever.  Musculoskeletal: Positive for back pain.  All other systems reviewed and are negative.    Physical Exam Updated Vital Signs BP (!) 146/80   Pulse 63   Temp 98 F (36.7 C) (Oral)   Resp 16   Ht 5\' 9"  (1.753 m)   Wt 111.1 kg (245 lb) Comment: pt rerports weight on Monday at home  SpO2 99%   BMI 36.18 kg/m   Physical Exam  Constitutional: He is oriented to person, place, and time. He appears well-developed and well-nourished.  Morbidly obese male.  HENT:  Head: Normocephalic and atraumatic.  Mouth/Throat: Oropharynx is clear and moist. No oropharyngeal exudate.  Eyes: Conjunctivae and EOM are normal.  Right eye exhibits no discharge. Left eye exhibits no discharge. No scleral icterus.  Neck: Normal range of motion. Neck supple.  Cardiovascular: Normal rate, regular rhythm, normal heart sounds and intact distal pulses.   Pulmonary/Chest: Effort normal and breath sounds normal. No respiratory distress. He has no wheezes. He has no rales. He exhibits tenderness. He exhibits no laceration, no crepitus, no edema, no deformity, no swelling and no retraction.  TTP over left lateral chest wall.  Abdominal: Soft. Bowel sounds are normal. He exhibits no distension and no mass. There is no tenderness. There is no rebound and no guarding.  Musculoskeletal: Normal range of motion. He exhibits edema (1+ pitting edema to BLE, pt reports is chronic and unchanged) and tenderness. He exhibits no deformity.  No midline C, T, or L tenderness. TTP over left lateral mid trapezius. Full range of motion of neck and back. Full range of motion of bilateral upper and lower extremities, with 5/5 strength. Sensation intact. 2+ radial and PT pulses. Cap refill <2 seconds. Patient able to stand and ambulate with cane which he uses at baseline.    Neurological: He is alert and oriented to person, place, and time. He displays normal reflexes. No sensory deficit.  Skin: Skin is warm and dry.  Nursing note and vitals reviewed.    ED Treatments / Results  Labs (all labs ordered are listed, but only abnormal results are displayed) Labs Reviewed - No data to display  EKG  EKG Interpretation None       Radiology Dg Ribs Unilateral W/chest Left  Result Date: 03/03/2017 CLINICAL DATA:  Left rib pain for 3 months. EXAM: LEFT RIBS AND CHEST - 3+ VIEW COMPARISON:  July 22, 2016 FINDINGS: There is probable nondisplaced fracture of the lateral posterior left eighth rib. The lungs are clear. The aorta is tortuous. The heart size is normal. IMPRESSION: Probable nondisplaced fracture of the lateral posterior left eighth rib.  Electronically Signed   By: Abelardo Diesel M.D.   On: 03/03/2017 18:13    Procedures Procedures (including critical care time)  Medications Ordered in ED Medications  fentaNYL (SUBLIMAZE) injection 50 mcg (50 mcg Intravenous Given 03/03/17 1730)     Initial Impression / Assessment and Plan / ED Course  I have reviewed the triage vital signs and the nursing notes.  Pertinent labs & imaging results that were available during my care of the patient were reviewed by me and considered in my medical decision making (see chart for details).     Patient presents with worsening pain to the side of his left mid back that has been present for the past few months but worsen last night while he was laying in bed. Denies any recent fall, trauma or injury. Denies radiation. No back pain red flags. PMH of stage IV  prostate cancer metastatic to bone (s/p radiation tx, last tx in Feb 2018). VSS. Exam revealed TTP over lateral aspect of left mid ribs without injury or deformity. Lungs CTAB. No midline spinal tenderness. No neuro deficits. Pt given pain meds in the ED. Left rib/CXR showed nondisplaced fx of lateral posterior left 8th rib. Suspect fx is due to metastatic cancer. Pt reports having appointment scheduled with his oncologist on Monday. Discussed pt with Dr. Wilson Singer. Plan to d/c pt home with pain meds, incentive spirometer and symptomatic tx. Advised pt to f/u with oncologist at scheduled appointment for follow up and further management of metastatic cancer. Discussed strict return precautions.   Final Clinical Impressions(s) / ED Diagnoses   Final diagnoses:  Closed fracture of one rib of left side, initial encounter    New Prescriptions Discharge Medication List as of 03/03/2017  6:58 PM       Nona Dell, PA-C 03/03/17 2330    Virgel Manifold, MD 03/11/17 1128

## 2017-03-04 ENCOUNTER — Telehealth: Payer: Self-pay | Admitting: *Deleted

## 2017-03-04 ENCOUNTER — Other Ambulatory Visit: Payer: Self-pay | Admitting: *Deleted

## 2017-03-04 MED ORDER — TRAMADOL HCL 50 MG PO TABS: 50.0000 mg | ORAL_TABLET | Freq: Four times a day (QID) | ORAL | 0 refills | Status: DC | PRN

## 2017-03-04 NOTE — Telephone Encounter (Signed)
Called and spoke with patient informing him that I called in his pain medicine to the [facility] on [address]. [facility] is now [facility]. Patient verbalized understandnng.

## 2017-03-09 ENCOUNTER — Encounter (HOSPITAL_COMMUNITY): Payer: Self-pay

## 2017-03-09 ENCOUNTER — Emergency Department (HOSPITAL_COMMUNITY): Payer: Medicare PPO

## 2017-03-09 ENCOUNTER — Observation Stay (HOSPITAL_COMMUNITY)
Admission: EM | Admit: 2017-03-09 | Discharge: 2017-03-10 | Disposition: A | Discharge status: Home / Self Care | Payer: Medicare PPO | Attending: Internal Medicine | Admitting: Internal Medicine

## 2017-03-09 DIAGNOSIS — R319 Hematuria, unspecified: Principal | ICD-10-CM | POA: Insufficient documentation

## 2017-03-09 DIAGNOSIS — Z6836 Body mass index (BMI) 36.0-36.9, adult: Secondary | ICD-10-CM | POA: Diagnosis not present

## 2017-03-09 DIAGNOSIS — R609 Edema, unspecified: Secondary | ICD-10-CM | POA: Diagnosis not present

## 2017-03-09 DIAGNOSIS — C7951 Secondary malignant neoplasm of bone: Secondary | ICD-10-CM | POA: Insufficient documentation

## 2017-03-09 DIAGNOSIS — Z791 Long term (current) use of non-steroidal anti-inflammatories (NSAID): Secondary | ICD-10-CM | POA: Insufficient documentation

## 2017-03-09 DIAGNOSIS — E669 Obesity, unspecified: Secondary | ICD-10-CM | POA: Diagnosis not present

## 2017-03-09 DIAGNOSIS — R1032 Left lower quadrant pain: Secondary | ICD-10-CM | POA: Insufficient documentation

## 2017-03-09 DIAGNOSIS — Z885 Allergy status to narcotic agent status: Secondary | ICD-10-CM | POA: Diagnosis not present

## 2017-03-09 DIAGNOSIS — Z881 Allergy status to other antibiotic agents status: Secondary | ICD-10-CM | POA: Diagnosis not present

## 2017-03-09 DIAGNOSIS — D649 Anemia, unspecified: Secondary | ICD-10-CM

## 2017-03-09 DIAGNOSIS — C61 Malignant neoplasm of prostate: Secondary | ICD-10-CM | POA: Diagnosis present

## 2017-03-09 DIAGNOSIS — R2681 Unsteadiness on feet: Secondary | ICD-10-CM | POA: Insufficient documentation

## 2017-03-09 DIAGNOSIS — Z923 Personal history of irradiation: Secondary | ICD-10-CM | POA: Diagnosis not present

## 2017-03-09 DIAGNOSIS — R300 Dysuria: Secondary | ICD-10-CM | POA: Diagnosis not present

## 2017-03-09 DIAGNOSIS — R05 Cough: Secondary | ICD-10-CM | POA: Insufficient documentation

## 2017-03-09 DIAGNOSIS — C7911 Secondary malignant neoplasm of bladder: Secondary | ICD-10-CM

## 2017-03-09 LAB — HEPATIC FUNCTION PANEL
ALBUMIN: 2.7 g/dL — AB (ref 3.5–5.0)
ALK PHOS: 111 U/L (ref 38–126)
ALT: 11 U/L — AB (ref 17–63)
AST: 21 U/L (ref 15–41)
BILIRUBIN TOTAL: 1 mg/dL (ref 0.3–1.2)
Bilirubin, Direct: 0.2 mg/dL (ref 0.1–0.5)
Indirect Bilirubin: 0.8 mg/dL (ref 0.3–0.9)
Total Protein: 6.2 g/dL — ABNORMAL LOW (ref 6.5–8.1)

## 2017-03-09 LAB — CBC WITH DIFFERENTIAL/PLATELET
BASOS PCT: 0 %
Basophils Absolute: 0 10*3/uL (ref 0.0–0.1)
EOS PCT: 1 %
Eosinophils Absolute: 0.1 10*3/uL (ref 0.0–0.7)
HEMATOCRIT: 30.4 % — AB (ref 39.0–52.0)
Hemoglobin: 9.9 g/dL — ABNORMAL LOW (ref 13.0–17.0)
LYMPHS ABS: 1.4 10*3/uL (ref 0.7–4.0)
Lymphocytes Relative: 20 %
MCH: 21.4 pg — ABNORMAL LOW (ref 26.0–34.0)
MCHC: 32.6 g/dL (ref 30.0–36.0)
MCV: 65.7 fL — AB (ref 78.0–100.0)
MONO ABS: 1.3 10*3/uL — AB (ref 0.1–1.0)
Monocytes Relative: 19 %
Neutro Abs: 4 10*3/uL (ref 1.7–7.7)
Neutrophils Relative %: 60 %
Platelets: 168 10*3/uL (ref 150–400)
RBC: 4.63 MIL/uL (ref 4.22–5.81)
RDW: 15.8 % — AB (ref 11.5–15.5)
WBC: 6.8 10*3/uL (ref 4.0–10.5)

## 2017-03-09 LAB — URINALYSIS, ROUTINE W REFLEX MICROSCOPIC

## 2017-03-09 LAB — BASIC METABOLIC PANEL
Anion gap: 11 (ref 5–15)
BUN: 12 mg/dL (ref 6–20)
CHLORIDE: 103 mmol/L (ref 101–111)
CO2: 24 mmol/L (ref 22–32)
CREATININE: 1.14 mg/dL (ref 0.61–1.24)
Calcium: 9.6 mg/dL (ref 8.9–10.3)
GFR calc Af Amer: >60 mL/min (ref >60)
GFR calc non Af Amer: >60 mL/min (ref >60)
Glucose, Bld: 96 mg/dL (ref 65–99)
Potassium: 3.8 mmol/L (ref 3.5–5.1)
Sodium: 138 mmol/L (ref 135–145)

## 2017-03-09 LAB — URINALYSIS, MICROSCOPIC (REFLEX)

## 2017-03-09 LAB — I-STAT CG4 LACTIC ACID, ED: Lactic Acid, Venous: 1.32 mmol/L (ref 0.5–1.9)

## 2017-03-09 MED ORDER — POLYETHYLENE GLYCOL 3350 17 G PO PACK: 17.0000 g | PACK | Freq: Every day | ORAL | Status: DC | PRN

## 2017-03-09 MED ORDER — ACETAMINOPHEN 650 MG RE SUPP: 650.0000 mg | Freq: Four times a day (QID) | RECTAL | Status: DC | PRN

## 2017-03-09 MED ORDER — ACETAMINOPHEN 325 MG PO TABS: 650.0000 mg | ORAL_TABLET | Freq: Four times a day (QID) | ORAL | Status: DC | PRN

## 2017-03-09 MED ORDER — TRAMADOL HCL 50 MG PO TABS: 50.0000 mg | ORAL_TABLET | Freq: Four times a day (QID) | ORAL | Status: DC | PRN

## 2017-03-09 MED ORDER — SENNA 8.6 MG PO TABS
1.0000 | ORAL_TABLET | Freq: Two times a day (BID) | ORAL | Status: DC
  Administered 2017-03-09 – 2017-03-10 (×2): 8.6 mg via ORAL
  Filled 2017-03-09 (×2): qty 1

## 2017-03-09 MED ORDER — SODIUM CHLORIDE 0.9 % IV BOLUS (SEPSIS)
500.0000 mL | Freq: Once | INTRAVENOUS | Status: AC
  Administered 2017-03-09: 500 mL via INTRAVENOUS

## 2017-03-09 NOTE — ED Notes (Signed)
Admitting at bedside 

## 2017-03-09 NOTE — ED Triage Notes (Signed)
Pt reports hematuria onset two days ago associated with left flank pain

## 2017-03-09 NOTE — H&P (Signed)
Date: 03/09/2017               Patient Name:  Gregory Chaney MRN: [id number]  DOB: 10/15/1940 Age / Sex: 76 y.o., male   PCP: [name], NP         Medical Service: Internal Medicine Teaching Service         Attending Physician: Dr. Lucious Groves, DO    First Contact: Dr. Jari Favre Pager: [phone]  Second Contact: Dr. Juleen China Pager: [phone]       After Hours (After 5p/  First Contact Pager: [phone]  weekends / holidays): Second Contact Pager: [phone]   Chief Complaint: Hematuria with clots  History of Present Illness:  Gregory Chaney is a 76yo male with long standing history of untreated prostate cancer initially diagnosed in 1990's. Patient presents today with 2 day history of hematuria associated with some pain with passing clots. He endorses back pain that was recently diagnosed as a rib fracture; he also endorses weight loss, loss of appetite, chronic diarrhea, chest pain with movement and occasional cough, and new onset LE swelling.   He denies abdominal pain, nausea, vomiting, fevers, chills, melena, hematochezia, shortness of breath, orthopnea.   Patient states that he was initially diagnosed with prostate cancer in 1990 but had not received any treatment due to believing he was cured; he states that until Feb 2018, he had otherwise felt healthy and without any major medical events or diseases. From chart review, it appears that he has had hematuria in the past - initially seen in [place] - in 2015 when he was found to have enlarged prostate with possible bladder invasion on CT scan; cystoscopy at the time found bleeding to be from medial lobe of prostate which was the size of a baseball and was cauterized to stop bleeding; at the time he refused surgery. He was then seen at [facility] by new PCP who referred him to Urology again - transurethral prostate biopsy was performed which showed prostatic adenocarcinoma. 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 Patient underwent palliative radiation which was also completed in February. He was supposed to see med onc, Dr. Alen Blew, however has not yet; his next appointment is 03/14/17.   Meds:  Current Meds  Medication Sig  . naproxen sodium (ANAPROX) 220 MG tablet Take 220 mg by mouth 2 (two) times daily with a meal.  . traMADol (ULTRAM) 50 MG tablet Take 1 tablet (50 mg total) by mouth every 6 (six) hours as needed.     Allergies: Allergies as of 03/09/2017 - Review Complete 03/09/2017  Allergen Reaction Noted  . Cephalexin Other (See Comments) 07/22/2016  . Hydrocodone Other (See Comments) 06/12/2014  . Morphine Other (See Comments) 06/12/2014  . Flomax [tamsulosin hcl] Other (See Comments) 07/22/2016  . Percocet [oxycodone-acetaminophen] Nausea Only 07/22/2016   Past Medical History:  Diagnosis Date  . History of radiation therapy 11/01/16- 11/12/16   Lower spine L3- S4/ 30 Gy in 10 fractions  . Obesity   . Prostate cancer ([place]) 1995    Family History: Patient denies family history of cancer, heart disease or stroke.  Social History: Patient lives by himself in [place], widower; he has a granddaughter nearby. Another granddaughter in [place] has  been designated to make decisions per patient if not able to by himself. He denies history of smoking, will occasionally drink alcohol, and denies illicit drug use.   Review of Systems: A complete ROS was negative except as per HPI.   Physical Exam: Blood pressure (!) 147/48, pulse 75, temperature 97.7 F (36.5 C), temperature source Oral, resp. rate 19, height '5\' 9"'  (1.753 m), weight 238 lb 8 oz (108.2 kg), SpO2 100 %. General: alert, well-developed, and cooperative to examination.  Head: normocephalic and  atraumatic.  Eyes: vision grossly intact, no injection and anicteric.  Mouth: moist mucus membranes.  Neck: supple, full ROM, no JVD.  Lungs: normal respiratory effort, no accessory muscle use, normal breath sounds, no crackles, and no wheezes. Heart: normal rate, regular rhythm, no murmur, no gallop, and no rub.  Abdomen: soft, non-tender, normal bowel sounds, no distention, no guarding, no rebound tenderness, no hepatomegaly, and no splenomegaly.  Msk: no joint swelling, no joint warmth, and no redness over joints. No spinal tenderness; minimal anterior chest tenderness to palpation; tender to palpation of left lateral rib. Pulses: 2+ DP/PT pulses bilaterally Extremities: No cyanosis, clubbing; 2+ pitting edema in bil LE. Neurologic: alert & oriented X3, cranial nerves II-XII intact, sensation intact.  Skin: turgor normal and no rashes.  Psych: Oriented X3, memory intact for recent and remote, normally interactive, good eye contact, not anxious appearing, and not depressed appearing  Labs: UA: TNTC TBC and WBC Na 138, K 3.8, Cl 103, CO2 24, BUN 12, Cr 1.14, Glu 96 Tot prt 6.2, Alb 2.7, Ast 21, Alt 11, Alk phos 111, Bili 1.0 WBC 6.8, Hgb 9.9 (12.1 in feb '18), Hct 30.4, MCV 65.7, Plt 168  EKG: Personally reviewed - Sinus rhythm, RBBB  CXR: Personally reviewed - possible L sided effusion, no edema or focal infiltrates - radiology read as new linear lesion in LLL  Assessment & Plan by Problem: Principal Problem:   Prostate cancer metastatic to bone [facility]) Active Problems:   Hematuria   Prostate cancer (HCC)  Hematuria: Patient with recurrent hematuria in setting of metastatic prostate cancer with radiological evidence of possible bladder invasion. Currently not having obstructive symptoms. CT renal study also did not reveal obstruction.  --Urology consult in AM  Metastatic prostate cancer: Presumed first diagnosis in 1990's; since then confirmed by biopsy in 2015, unfortunately  patient at the time did not follow up with urology and has had progressive metastasis since then. Patient s/p palliative radiation. He is scheduled to see med onc on 6/25 for first visit. Patient still with significant amount of denial about cancer diagnosis. 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 --f/u AM CBC  LE edema: Patient with no history of heart disease, liver disease, renal disease and no evidence of these on exam, labs, and imaging. It is likely dependent in nature. --compression stockings  Diet: regular IVF: none VTE ppx: SCD's Code: DNR - discussed with patient at bedside; his wishes are consistent with DNR status - he does not want aggressive measures such as CPR, shocks or intubation.  Dispo: Admit patient to Inpatient with expected length of stay greater than 2 midnights.  SignedAlphonzo Grieve, MD 03/09/2017, 8:36 PM  Pager [phone]

## 2017-03-09 NOTE — ED Provider Notes (Signed)
[place] DEPT Provider Note   CSN: [id number] Arrival date & time: 03/09/17  0957     History   Chief Complaint Chief Complaint  Patient presents with  . Hematuria    HPI Gregory Chaney is a 76 y.o. male.  He complains of hematuria, urine frequency, and dysuria, for 1 day.  He also has left flank pain which is worse with movement.  He was seen in the ED several days ago diagnosed with a left rib 8 fracture.  This fracture was atraumatic.  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  He denies fever, chills, nausea, vomiting, weakness or dizziness.  He has bilateral lower extremity edema present for greater than 1 month.  He does not have a history of heart failure.  He is not currently being treated for prostate cancer.  There are no other known modifying factors.  HPI  Past Medical History:  Diagnosis Date  . History of radiation therapy 11/01/16- 11/12/16   Lower spine L3- S4/ 30 Gy in 10 fractions  . Obesity   . Prostate cancer [facility]) 1995    Patient Active Problem List   Diagnosis Date Noted  . Bone metastases ([place]) 10/26/2016    Past Surgical History:  Procedure Laterality Date  . CYSTOSCOPY  06/13/2014   cystoscopy with transrectal ultrasound biopsy of prostate       Home Medications    Prior to Admission medications   Medication Sig Start Date End Date Taking? Authorizing Provider  naproxen sodium (ANAPROX) 220 MG tablet Take 220 mg by mouth 2 (two) times daily with a meal.   Yes [provider]  traMADol (ULTRAM) 50 MG tablet Take 1 tablet (50 mg total) by mouth every 6 (six) hours as needed. 03/04/17  Yes Wyatt Portela, MD  dexamethasone (DECADRON) 4 MG tablet Take 1 tablet (4 mg total) by mouth 4 (four) times daily. On 2-14 taper to 1 tab TID.  On 2-21 taper to 1 tab BID.  On 3-7 taper to 1 tab daily. On 3-21 taper to 1/2 tab daily.  On 3-28 taper to 1/2 tab every other day. Continue this until 4-11, then stop. Patient not taking: Reported on  03/09/2017 11/01/16   Eppie Gibson, MD  ranitidine (ZANTAC) 150 MG tablet Take 1 tablet (150 mg total) by mouth 2 (two) times daily. Take while on Dexamethasone to protect stomach. Patient not taking: Reported on 12/10/2016 10/26/16   Eppie Gibson, MD    Family History No family history on file.  Social History Social History  Substance Use Topics  . Smoking status: Never Smoker  . Smokeless tobacco: Never Used  . Alcohol use No     Allergies   Cephalexin; Hydrocodone; Morphine; Flomax [tamsulosin hcl]; and Percocet [oxycodone-acetaminophen]   Review of Systems Review of Systems  All other systems reviewed and are negative.    Physical Exam Updated Vital Signs BP 117/62   Pulse 92   Temp 98.3 F (36.8 C) (Oral)   Resp 13   Ht 5\' 9"  (1.753 m)   Wt 111.1 kg (245 lb)   SpO2 99%   BMI 36.18 kg/m   Physical Exam  Constitutional: He is oriented to person, place, and time. He appears well-developed.  Morbidly obese, elderly  HENT:  Head: Normocephalic and atraumatic.  Right Ear: External ear normal.  Left Ear: External ear normal.  Eyes: Conjunctivae and EOM are normal. Pupils are equal, round, and reactive to light.  Neck: Normal range of motion  and phonation normal. Neck supple.  Cardiovascular: Normal rate, regular rhythm and normal heart sounds.   Pulmonary/Chest: Effort normal and breath sounds normal. No respiratory distress. He exhibits tenderness (Left flank diffuse, without crepitation). He exhibits no bony tenderness.  Abdominal: Soft. There is no tenderness.  Musculoskeletal: Normal range of motion. He exhibits edema (3+ pitting bilateral lower extremities).  Pain left rib areas with movement from supine to sitting.  Neurological: He is alert and oriented to person, place, and time. A cranial nerve deficit is present. No sensory deficit. He exhibits normal muscle tone. Coordination normal.  He is very hard of hearing otherwise no evident asymmetry  Skin: Skin  is warm, dry and intact.  Psychiatric: He has a normal mood and affect. His behavior is normal. Judgment and thought content normal.  Nursing note and vitals reviewed.    ED Treatments / Results  Labs (all labs ordered are listed, but only abnormal results are displayed) Labs Reviewed  URINALYSIS, ROUTINE W REFLEX MICROSCOPIC - Abnormal; Notable for the following:       Result Value   Color, Urine RED (*)    APPearance TURBID (*)    Glucose, UA   (*)    Value: TEST NOT REPORTED DUE TO COLOR INTERFERENCE OF URINE PIGMENT   Hgb urine dipstick   (*)    Value: TEST NOT REPORTED DUE TO COLOR INTERFERENCE OF URINE PIGMENT   Bilirubin Urine   (*)    Value: TEST NOT REPORTED DUE TO COLOR INTERFERENCE OF URINE PIGMENT   Ketones, ur   (*)    Value: TEST NOT REPORTED DUE TO COLOR INTERFERENCE OF URINE PIGMENT   Protein, ur   (*)    Value: TEST NOT REPORTED DUE TO COLOR INTERFERENCE OF URINE PIGMENT   Nitrite   (*)    Value: TEST NOT REPORTED DUE TO COLOR INTERFERENCE OF URINE PIGMENT   Leukocytes, UA   (*)    Value: TEST NOT REPORTED DUE TO COLOR INTERFERENCE OF URINE PIGMENT   All other components within normal limits  URINALYSIS, MICROSCOPIC (REFLEX) - Abnormal; Notable for the following:    Bacteria, UA FEW (*)    Squamous Epithelial / LPF 0-5 (*)    All other components within normal limits  CBC WITH DIFFERENTIAL/PLATELET - Abnormal; Notable for the following:    Hemoglobin 9.9 (*)    HCT 30.4 (*)    MCV 65.7 (*)    MCH 21.4 (*)    RDW 15.8 (*)    Monocytes Absolute 1.3 (*)    All other components within normal limits  HEPATIC FUNCTION PANEL - Abnormal; Notable for the following:    Total Protein 6.2 (*)    Albumin 2.7 (*)    ALT 11 (*)    All other components within normal limits  URINE CULTURE  BASIC METABOLIC PANEL  I-STAT CG4 LACTIC ACID, ED    EKG  EKG Interpretation  Date/Time:  Wednesday March 09 2017 11:42:50 EDT Ventricular Rate:  81 PR Interval:    QRS  Duration: 139 QT Interval:  400 QTC Calculation: 465 R Axis:   -26 Text Interpretation:  Sinus rhythm Short PR interval Right bundle branch block Baseline wander in lead(s) V3 V5 No old tracing to compare Confirmed by Daleen Bo [phone]) on 03/09/2017 2:21:39 PM       Radiology Dg Chest 2 View  Result Date: 03/09/2017 CLINICAL DATA:  Onset of hematuria 2 days ago associated with left flank pain. Pleuritic chest  pain as well. History of left-sided rib fracture during a fall in February 2018. EXAM: CHEST  2 VIEW COMPARISON:  Chest x-ray and left rib series of March 03, 2017. FINDINGS: The lungs are adequately inflated. There is new linear increased density at the left lung base. There is no significant pleural effusion. The heart and pulmonary vascularity are normal. The mediastinum is normal in width. There is calcification in the wall of the aortic arch. There is mild multilevel degenerative disc disease of the thoracic spine. IMPRESSION: Minimal atelectasis at the left lung base which has developed since the previous study. No acute pneumonia nor pleural effusion. Given the patient's hematuria, left flank pain, and pleuritic chest pain and history of metastatic disease to the spine, CT scanning of the chest and abdomen would likely be the most useful next imaging step. Electronically Signed   By: David  Martinique M.D.   On: 03/09/2017 12:41   Ct Renal Stone Study  Result Date: 03/09/2017 CLINICAL DATA:  Hematuria for 2 days with left flank pain, initial encounter EXAM: CT ABDOMEN AND PELVIS WITHOUT CONTRAST TECHNIQUE: Multidetector CT imaging of the abdomen and pelvis was performed following the standard protocol without IV contrast. COMPARISON:  None. FINDINGS: Lower chest: Mild bibasilar atelectatic changes are noted. Hepatobiliary: The liver is within normal limits. Dependent density is noted in the gallbladder which may represent small stones or gallbladder sludge. Pancreas: Unremarkable. No  pancreatic ductal dilatation or surrounding inflammatory changes. Spleen: Normal in size without focal abnormality. Adrenals/Urinary Tract: The adrenal glands are within normal limits. The right kidney demonstrates no obstructive changes or calculi. The left kidney demonstrates no calculi or obstructive changes. The bladder is well distended and demonstrates some intraluminal hyperdensity a portion of which may represent thrombus given the patient's clinical history. The prostate is again enlarged with an irregular contour or posteriorly on the left consistent with the patient's given clinical history of prostate carcinoma. Some in growth into the bladder is likely present. Stomach/Bowel: Scattered diverticular change of the colon is noted. No obstructive changes or inflammatory changes are seen. The appendix is within normal limits. Vascular/Lymphatic: Aortic atherosclerosis. No enlarged abdominal or pelvic lymph nodes. Reproductive: The prostate is enlarged as described above consistent with the given clinical history of metastatic prostate carcinoma. Other: No abdominal wall hernia or abnormality. No abdominopelvic ascites. Musculoskeletal: Mixed lytic and sclerotic lesions are noted throughout the lumbar spine, thoracic spine consistent with the given clinical history of metastatic prostate carcinoma. IMPRESSION: Hyperdense material within the bladder likely representing thrombus. There is also likely some invasion of the bladder wall related to the patient's known prostate carcinoma. Enlargement of the prostate posteriorly on the left is noted consistent with the given clinical history. Changes in the bony structures consistent with prior metastatic disease. Dependent changes within the gallbladder likely representing small stones or gallbladder sludge. Electronically Signed   By: Inez Catalina M.D.   On: 03/09/2017 13:58    Procedures Procedures (including critical care time)  Medications Ordered in  ED Medications  sodium chloride 0.9 % bolus 500 mL (0 mLs Intravenous Stopped 03/09/17 1315)     Initial Impression / Assessment and Plan / ED Course  I have reviewed the triage vital signs and the nursing notes.  Pertinent labs & imaging results that were available during my care of the patient were reviewed by me and considered in my medical decision making (see chart for details).      Patient Vitals for the past 24 hrs:  BP Temp Temp src Pulse Resp SpO2 Height Weight  03/09/17 1600 117/62 - - 92 13 99 % - -  03/09/17 1530 115/69 - - 80 18 93 % - -  03/09/17 1500 (!) 121/99 - - 81 11 95 % - -  03/09/17 1451 (!) 142/75 - - 92 12 92 % - -  03/09/17 1430 (!) 142/75 - - 82 13 93 % - -  03/09/17 1400 (!) 156/81 - - 81 12 99 % - -  03/09/17 1330 (!) 125/59 - - 70 18 93 % - -  03/09/17 1300 (!) 111/54 - - 76 15 93 % - -  03/09/17 1237 132/69 - - 73 12 100 % - -  03/09/17 1230 132/69 - - 73 12 100 % - -  03/09/17 1145 129/77 - - 83 17 95 % - -  03/09/17 1008 - - - - - - 5\' 9"  (1.753 m) 111.1 kg (245 lb)  03/09/17 1007 111/78 98.3 F (36.8 C) Oral 93 16 97 % - -    4:12 PM Reevaluation with update and discussion. After initial assessment and treatment, an updated evaluation reveals patient states he feels somewhat better after IV fluids.  However he continues to bleed very bloody urine, passing at least 150 cc in the emergency department.  Findings discussed with patient all questions were answered. Bernard Donahoo L    4:14 PM-Consult complete with on-call internal medicine resident. Patient case explained and discussed.  He agrees to admit patient for further evaluation and treatment. Call ended at 61: 32  Final Clinical Impressions(s) / ED Diagnoses   Final diagnoses:  Prostate cancer metastatic to multiple sites [facility])  Hematuria, unspecified type  Anemia, unspecified type    Patient with metastatic prostate cancer, and apparent erosion into the bladder, now with frank blood  per urethra, and significant drop in hemoglobin.  Attempt to arrange follow-up with oncology, unable to for at least 5 days as an outpatient.  Therefore will admit the patient for observation, serial hemoglobin testing and oncology consultation.  Nursing Notes Reviewed/ Care Coordinated Applicable Imaging Reviewed Interpretation of Laboratory Data incorporated into ED treatment    Plan: Admit  New Prescriptions New Prescriptions   No medications on file     Daleen Bo, MD 03/09/17 [phone]

## 2017-03-09 NOTE — ED Notes (Signed)
Meal tray arrived

## 2017-03-10 ENCOUNTER — Encounter (HOSPITAL_COMMUNITY): Payer: Self-pay | Admitting: Internal Medicine

## 2017-03-10 DIAGNOSIS — C7951 Secondary malignant neoplasm of bone: Secondary | ICD-10-CM | POA: Diagnosis not present

## 2017-03-10 DIAGNOSIS — C61 Malignant neoplasm of prostate: Secondary | ICD-10-CM

## 2017-03-10 LAB — COMPREHENSIVE METABOLIC PANEL
ALBUMIN: 2.4 g/dL — AB (ref 3.5–5.0)
ALK PHOS: 95 U/L (ref 38–126)
ALT: 11 U/L — ABNORMAL LOW (ref 17–63)
AST: 19 U/L (ref 15–41)
Anion gap: 10 (ref 5–15)
BILIRUBIN TOTAL: 1.2 mg/dL (ref 0.3–1.2)
BUN: 11 mg/dL (ref 6–20)
CALCIUM: 9.2 mg/dL (ref 8.9–10.3)
CO2: 26 mmol/L (ref 22–32)
Chloride: 105 mmol/L (ref 101–111)
Creatinine, Ser: 1.01 mg/dL (ref 0.61–1.24)
GFR calc Af Amer: >60 mL/min (ref >60)
GFR calc non Af Amer: >60 mL/min (ref >60)
GLUCOSE: 91 mg/dL (ref 65–99)
Potassium: 3.6 mmol/L (ref 3.5–5.1)
SODIUM: 141 mmol/L (ref 135–145)
TOTAL PROTEIN: 5.3 g/dL — AB (ref 6.5–8.1)

## 2017-03-10 LAB — CBC
HEMATOCRIT: 28.4 % — AB (ref 39.0–52.0)
HEMOGLOBIN: 9.1 g/dL — AB (ref 13.0–17.0)
MCH: 21 pg — AB (ref 26.0–34.0)
MCHC: 32 g/dL (ref 30.0–36.0)
MCV: 65.4 fL — ABNORMAL LOW (ref 78.0–100.0)
Platelets: 159 10*3/uL (ref 150–400)
RBC: 4.34 MIL/uL (ref 4.22–5.81)
RDW: 15.7 % — AB (ref 11.5–15.5)
WBC: 5.3 10*3/uL (ref 4.0–10.5)

## 2017-03-10 LAB — URINE CULTURE: Culture: NO GROWTH

## 2017-03-10 LAB — PROTIME-INR
INR: 1.36
PROTHROMBIN TIME: 16.9 s — AB (ref 11.4–15.2)

## 2017-03-10 MED ORDER — HYDROCODONE-ACETAMINOPHEN 5-325 MG PO TABS: 1.0000 | ORAL_TABLET | Freq: Four times a day (QID) | ORAL | 0 refills | Status: DC | PRN

## 2017-03-10 MED ORDER — CAPSAICIN 0.025 % EX CREA: TOPICAL_CREAM | Freq: Two times a day (BID) | CUTANEOUS | 3 refills | Status: DC

## 2017-03-10 MED ORDER — POLYETHYLENE GLYCOL 3350 17 G PO PACK: 17.0000 g | PACK | Freq: Every day | ORAL | 0 refills | Status: DC | PRN

## 2017-03-10 NOTE — Discharge Summary (Signed)
Name: Gregory Chaney MRN: [id number] DOB: March 10, 1941 76 y.o. PCP: [name], NP  Date of Admission: 03/09/2017 10:58 AM Date of Discharge: 03/10/2017 Attending Physician: Lucious Groves, DO  Discharge Diagnosis: 1. 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 My mind does not work right   Herbalist Hcl] Other (See Comments)   My mind doesn't work well   Percocet [oxycodone-acetaminophen] Nausea Only      Medication List    STOP taking these medications   dexamethasone 4 MG tablet Commonly known as:  DECADRON   naproxen sodium 220 MG tablet Commonly known as:  ANAPROX   ranitidine 150 MG tablet Commonly known as:  ZANTAC   traMADol 50 MG tablet Commonly known as:  ULTRAM     TAKE these medications   capsaicin 0.025 % cream Commonly known as:  ZOSTRIX Apply topically 2 (two) times daily.   HYDROcodone-acetaminophen 5-325 MG tablet Commonly known as:  NORCO/VICODIN Take 1 tablet by mouth every 6 (six) hours as needed for moderate pain.   polyethylene glycol packet Commonly known as:  MIRALAX / GLYCOLAX Take 17 g by mouth daily as needed for mild constipation.            Durable Medical Equipment        Start     Ordered   03/10/17 1537  For home use only DME Walker  Once    Question:  Patient needs a walker to treat with the following condition  Answer:  Primary malignant neoplasm of prostate metastatic to bone [facility])   03/10/17 1537      Disposition and follow-up:   Gregory Chaney was discharged from [facility] in Stable condition.  At the hospital follow up visit please address:  1.   Hematuria: --has patient continued to pass blood? Has he had recurrence of passing clots? --has he had symptoms of obstruction? --has he followed up with urology? --has he used NSAIDs? --if still having hematuria, please check CBC  Metastatic prostate cancer: --stopped tramadol and given trial of hydrocodone-apap - has had adverse reaction with oxy in past ('out of my head') and instructed to stop if this occurs again --NSAIDs work better for his pain but instructed not to use due to current bleeding --instructed to follow up with Dr. Alen Blew with med-onc and [facility]  --is he drinking protein drinks to supplement poor food intake?  LE edema: --likely 2/2 low albumin --instructed in use of compression stockings and elevating his legs  2.  Labs / imaging needed at time of follow-up: CBC  3.  Pending labs/ test needing follow-up: none  Follow-up Appointments: Follow-up Information    [facility]. Go on 03/15/2017.   Why:  Appointment at 86 - noon. Please be there by 11:30AM Contact information: [place]       Wyatt Portela, MD. Go on 03/14/2017.   Specialty:  Oncology Why:  at [place]. It is very important that you keep this appointment. 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 Previous imaging was suggesting of possible bladder invasion of his prostate cancer; and looking in his chart, he has had previous episodes  in the past which had been treated with cautery via cystoscopy by Urology. On admission, patient was afebrile and with normal vital signs. His Hgb was 9.9 which is around his baseline. He did not have any obstructive symptoms; CT renal study confirmed no obstruction. Throughout hospital stay, he stopped passing clots and was continuing to just have hematuria; Hgb stayed stable. Urology was consulted by phone; in setting of no obstruction and decreasing clot excretion, they were comfortable with seeing patient in their office in one week, especially since patient already had Oncology visit in a few days. Patient was set up with Urology appointment and urged to keep Oncology appt on 6/25. Strict return precautions were given for sings of worsening anemia or urinary obstruction. Patient was advised to avoid NSAIDs. If he is still having hematuria, please consider repeat CBC.  Metastatic prostate adenocarcinoma: Metastasis to spine, soft tissue (paraspinal muscles) and likely invasion of bladder. Patient with back and rib pain (recent atraumatic rib fracture). He has been on tramadol which inadequately relieves his pain; he states aleve works much better. In setting of hematuria, he was advised against NSAID. He has previously had an adverse reaction to oxycodone which resulted with him feeling like he was "out of his head". At discharge, he was given short course of hydrocodone-apap and told not to take concurrently with tramadol; he was advised that if he should develop any concerning side effects to stop the medication. He has follow up with Med-Onc on 6/25.   LE edema: Patient with recent onset of pitting LE edema over last few weeks. He has no cardiac or renal history. Labs on admission revealed hypoalbuminemia likely due to poor PO intake in setting of lack of appetite in this patient with metastatic cancer. Urinalysis could not be used to evaluate for proteinuria due to the large blood burden. Patient was  given compression stockings, advised on their use and leg elevation. He was instructed to use protein supplement drinks since his PO intake is poor. Please assess for severity of edema and ability to take in PO.  Discharge Vitals:   BP 139/64 (BP Location: Left Arm)   Pulse 88   Temp 98.9 F (37.2 C) (Oral)   Resp 18   Ht 5\' 9"  (1.753 m)   Wt 238 lb 8 oz (108.2 kg)   SpO2 99%   BMI 35.22 kg/m   Pertinent Labs, Studies, and Procedures:  CBC Latest Ref Rng & Units 03/10/2017 03/09/2017 10/23/2016  WBC 4.0 - 10.5 K/uL 5.3 6.8 -  Hemoglobin 13.0 - 17.0 g/dL 9.1(L) 9.9(L) 13.3  Hematocrit 39.0 - 52.0 % 28.4(L) 30.4(L) 39.0  Platelets 150 - 400 K/uL 159 168 -   CMP Latest Ref Rng & Units 03/10/2017 03/09/2017 10/23/2016  Glucose 65 - 99 mg/dL 91 96 99  BUN 6 - 20 mg/dL 11 12 10   Creatinine 0.61 - 1.24 mg/dL 1.01 1.14 1.00  Sodium 135 - 145 mmol/L 141 138  141  Potassium 3.5 - 5.1 mmol/L 3.6 3.8 4.0  Chloride 101 - 111 mmol/L 105 103 105  CO2 22 - 32 mmol/L 26 24 -  Calcium 8.9 - 10.3 mg/dL 9.2 9.6 -  Total Protein 6.5 - 8.1 g/dL 5.3(L) 6.2(L) -  Total Bilirubin 0.3 - 1.2 mg/dL 1.2 1.0 -  Alkaline Phos 38 - 126 U/L 95 111 -  AST 15 - 41 U/L 19 21 -  ALT 17 - 63 U/L 11(L) 11(L) -   Urine culture 03/09/2017: no growth PT/INR 03/10/2017: 16.9/1.36  CXR 03/09/2017:  -Minimal atelectasis at the left lung base which has developed since the previous study. No acute pneumonia nor pleural effusion  -CT renal stone study 03/09/2017: Hyperdense material within the bladder likely representing thrombus. There is also likely some invasion of the bladder wall related to the patient's known prostate carcinoma. -Enlargement of the prostate posteriorly on the left is noted consistent with the given clinical history. -Changes in the bony structures consistent with prior metastatic disease. -Dependent changes within the gallbladder likely representing small stones or gallbladder sludge  Discharge  Instructions: Discharge Instructions    Call MD for:  difficulty breathing, headache or visual disturbances    Complete by:  As directed    Call MD for:  extreme fatigue    Complete by:  As directed    Call MD for:  hives    Complete by:  As directed    Call MD for:  persistant dizziness or light-headedness    Complete by:  As directed    Call MD for:  persistant nausea and vomiting    Complete by:  As directed    Call MD for:  redness, tenderness, or signs of infection (pain, swelling, redness, odor or green/yellow discharge around incision site)    Complete by:  As directed    Call MD for:  severe uncontrolled pain    Complete by:  As directed    Call MD for:  temperature >100.4    Complete by:  As directed    Diet - low sodium heart healthy    Complete by:  As directed    Discharge instructions    Complete by:  As directed    For your pain, try using hydrocodone - acetaminophen 5mg  (one tab) every 6 hours as needed. --if you start feeling confused, anxious or "out of your head", don't take any more of these --while taking hydrocodone, don't take any tramadol as they can make you very sedated and decrease your respiratory drive.  --please take a stool softener like miralax while taking hydrocodone  For your rib pain and chest pain with coughing, you can put capsaicin creme twice a day.   For your swelling, please wear compression stockings as much as you can and make sure you elevate your legs throughout the day.   You can take Ensure or other protein/supplement drinks to help give you nutrition since your appetite is poor.   Please follow up with Dr. Alen Blew on Monday at the [facility] and Urology on Tuesday.   If you are not able to urinate at all, develop worse abdominal pain, start having dizziness, chest pain, shortness of breath, please come to the ED.   Increase activity slowly    Complete by:  As directed       Signed: Alphonzo Grieve, MD 03/10/2017, 3:48 PM     Pager [phone]

## 2017-03-10 NOTE — Evaluation (Signed)
Occupational Therapy Evaluation Patient Details Name: Gregory Chaney MRN: [id number] DOB: Aug 14, 1941 Today's Date: 03/10/2017    History of Present Illness Pt with PMH significant for untreated prostate cancer, no other documented medical history, presents with hematauria and imaging reveals wide spread metastasis to multiple organs and bones, including T12.  No spinal cord involvement at time of eval.    Clinical Impression   PTA, pt was living alone and independent in ADLs, IADLs, and driving. Pt currently performing ADLs and functional mobility near baseline at Mod I level with increased time. Feel pt would benefit from additional acute OT visit for education on AE for LB ADLs due to pt's increased pain during LB ADLs. Recommend dc home once medically stable per physician.     Follow Up Recommendations  No OT follow up;Supervision - Intermittent    Equipment Recommendations  None recommended by OT    Recommendations for Other Services       Precautions / Restrictions Precautions Precautions: None Restrictions Weight Bearing Restrictions: No      Mobility Bed Mobility Overal bed mobility: Modified Independent             General bed mobility comments: In recliner upon arrival  Transfers Overall transfer level: Modified independent Equipment used: None                  Balance Overall balance assessment: No apparent balance deficits (not formally assessed)                                         ADL either performed or assessed with clinical judgement   ADL Overall ADL's : Needs assistance/impaired Eating/Feeding: Set up;Sitting   Grooming: Wash/dry hands;Oral care;Supervision/safety;Set up;Standing   Upper Body Bathing: Set up;Sitting   Lower Body Bathing: Supervison/ safety;Set up;Sit to/from stand Lower Body Bathing Details (indicate cue type and reason): Feel pt would benefit from AE Upper Body Dressing : Set up;Sitting   Lower Body  Dressing: Set up;Supervision/safety;Sit to/from stand Lower Body Dressing Details (indicate cue type and reason): Feel pt would benefit from AE to decrease his pain with LB dressing.  Toilet Transfer: Supervision/safety;Ambulation (Simulated to recliner)           Functional mobility during ADLs: Supervision/safety General ADL Comments: Pt performing ADLs and functional mobility near baseline. Feel he would benefit from one additional visit to educate him on AE to increase his independence with LB ADLs. Pt states he can do it "when I do it in my special way and take my time."      Vision Baseline Vision/History: Wears glasses Wears Glasses: At all times Patient Visual Report: No change from baseline       Perception     Praxis      Pertinent Vitals/Pain Pain Assessment: Faces Faces Pain Scale: Hurts even more Pain Location: mid back, L ribs Pain Descriptors / Indicators: Aching Pain Intervention(s): Monitored during session;Limited activity within patient's tolerance;Repositioned     Hand Dominance Right   Extremity/Trunk Assessment Upper Extremity Assessment Upper Extremity Assessment: Overall WFL for tasks assessed   Lower Extremity Assessment Lower Extremity Assessment: Overall WFL for tasks assessed   Cervical / Trunk Assessment Cervical / Trunk Assessment: Normal   Communication Communication Communication: HOH   Cognition Arousal/Alertness: Awake/alert Behavior During Therapy: WFL for tasks assessed/performed Overall Cognitive Status: Within Functional Limits for tasks assessed  General Comments: Pt oriented x4, suspect continues to be in denial about CA diagnosis   General Comments  Visitor arrived at end of session. Pt verbalized during session, that his family isn't reliable and tend to use him. He states he has friends from church or neighbors who could check in on him.      Exercises     Shoulder  Instructions      Home Living Family/patient expects to be discharged to:: Private residence Living Arrangements: Alone Available Help at Discharge: Family;Available PRN/intermittently Type of Home: House Home Access: Stairs to enter [employer] of Steps: 5-6 Entrance Stairs-Rails: Right;Left Home Layout: One level               Home Equipment: Cane - single point   Additional Comments: pt reports that his granddaughter lives in the area and is able to help him out intermittently      Prior Functioning/Environment Level of Independence: Independent with assistive device(s)        Comments: used SPC for all mobility 2/2 back pain.  Driving.  Indep with ADLS and IADLs        OT Problem List: Decreased range of motion;Decreased activity tolerance;Decreased knowledge of use of DME or AE;Pain      OT Treatment/Interventions: Self-care/ADL training;Therapeutic exercise;DME and/or AE instruction;Therapeutic activities;Patient/family education    OT Goals(Current goals can be found in the care plan section) Acute Rehab OT Goals Patient Stated Goal: none stated OT Goal Formulation: With patient Time For Goal Achievement: 03/24/17 Potential to Achieve Goals: Good ADL Goals Pt Will Perform Lower Body Bathing: with set-up;with supervision;with adaptive equipment;sit to/from stand Pt Will Perform Lower Body Dressing: with set-up;with supervision;sit to/from stand;with adaptive equipment  OT Frequency: Min 2X/week   Barriers to D/C:            Co-evaluation              AM-PAC PT "6 Clicks" Daily Activity     Outcome Measure Help from another person eating meals?: None Help from another person taking care of personal grooming?: None Help from another person toileting, which includes using toliet, bedpan, or urinal?: A Little Help from another person bathing (including washing, rinsing, drying)?: A Little Help from another person to put on and taking off  regular upper body clothing?: None Help from another person to put on and taking off regular lower body clothing?: A Little 6 Click Score: 21   End of Session Nurse Communication: Mobility status  Activity Tolerance: Patient tolerated treatment well Patient left: in chair;with family/visitor present;with call bell/phone within reach  OT Visit Diagnosis: Unsteadiness on feet (R26.81);Muscle weakness (generalized) (M62.81);Pain Pain - Right/Left: Left Pain - part of body:  (Abdomen)                Time: 0737-1062 OT Time Calculation (min): 24 min Charges:  OT General Charges $OT Visit: 1 Procedure OT Evaluation $OT Eval Low Complexity: 1 Procedure G-Codes:     Loura Pitt MSOT, OTR/L Acute Rehab Pager: [phone] Office: [place] 03/10/2017, 1:51 PM

## 2017-03-10 NOTE — Evaluation (Signed)
Physical Therapy Evaluation Patient Details Name: Gregory Chaney MRN: [id number] DOB: 1940-11-13 Today's Date: 03/10/2017   History of Present Illness  Pt with PMH significant for untreated prostate cancer, no other documented medical history, presents with hematauria and imaging reveals wide spread metastasis to multiple organs and bones, including T12.  No spinal cord involvement at time of eval.   Clinical Impression  Pt demonstrates pain in L torso and mid back with movement, but performing all mobility with supervision or better.  Concern for possibility of future spinal cord involvement with current mets to lumbar/thoracic spine.  Discussed with pt calling PCP or returning to ED if he experiences any progressive weakness or falls at home.  Pt verbalized understanding, no further skilled PT necessary at this time, and no f/u needed.  Please re-consult should there be a change in pt status.     Follow Up Recommendations No PT follow up;Supervision - Intermittent    Equipment Recommendations  None recommended by PT    Recommendations for Other Services       Precautions / Restrictions Precautions Precautions: None Restrictions Weight Bearing Restrictions: No      Mobility  Bed Mobility Overal bed mobility: Modified Independent             General bed mobility comments: requires increased time to come to sitting 2/2 pain in ribs from pathological fracture  Transfers Overall transfer level: Modified independent Equipment used: Straight cane                Ambulation/Gait Ambulation/Gait assistance: Supervision Ambulation Distance (Feet): 300 Feet Assistive device: Straight cane Gait Pattern/deviations: Step-through pattern;Antalgic;Trunk flexed;Wide base of support Gait velocity: decreased Gait velocity interpretation: <1.8 ft/sec, indicative of risk for recurrent falls General Gait Details: pt antalgic with LLE weight bearing  Stairs            Wheelchair  Mobility    Modified Rankin (Stroke Patients Only)       Balance Overall balance assessment: No apparent balance deficits (not formally assessed)                                           Pertinent Vitals/Pain Pain Assessment: Faces Faces Pain Scale: Hurts even more Pain Location: mid back, L ribs Pain Descriptors / Indicators: Aching Pain Intervention(s): Limited activity within patient's tolerance;Monitored during session    Home Living Family/patient expects to be discharged to:: Private residence Living Arrangements: Alone Available Help at Discharge: Family;Available PRN/intermittently Type of Home: House Home Access: Stairs to enter Entrance Stairs-Rails: Psychiatric nurse of Steps: 5-6 Home Layout: One level Home Equipment: Cane - single point Additional Comments: pt reports that his granddaughter lives in the area and is able to help him out intermittently    Prior Function Level of Independence: Independent with assistive device(s)         Comments: used SPC for all mobility 2/2 back pain.  Driving.  Indep with ADLS and IADLs     Hand Dominance        Extremity/Trunk Assessment        Lower Extremity Assessment Lower Extremity Assessment: Overall WFL for tasks assessed       Communication   Communication: HOH  Cognition Arousal/Alertness: Awake/alert Behavior During Therapy: WFL for tasks assessed/performed Overall Cognitive Status: Within Functional Limits for tasks assessed  General Comments: Pt oriented x4, suspect continues to be in denial about CA diagnosis      General Comments      Exercises     Assessment/Plan    PT Assessment Patent does not need any further PT services  PT Problem List         PT Treatment Interventions      PT Goals (Current goals can be found in the Care Plan section)  Acute Rehab PT Goals Patient Stated Goal: none  stated PT Goal Formulation: With patient    Frequency     Barriers to discharge        Co-evaluation               AM-PAC PT "6 Clicks" Daily Activity  Outcome Measure Difficulty turning over in bed (including adjusting bedclothes, sheets and blankets)?: A Little Difficulty moving from lying on back to sitting on the side of the bed? : A Little Difficulty sitting down on and standing up from a chair with arms (e.g., wheelchair, bedside commode, etc,.)?: A Little Help needed moving to and from a bed to chair (including a wheelchair)?: None Help needed walking in hospital room?: None Help needed climbing 3-5 steps with a railing? : A Little 6 Click Score: 20    End of Session   Activity Tolerance: Patient tolerated treatment well Patient left: in chair;with call bell/phone within reach Nurse Communication: Mobility status PT Visit Diagnosis: Difficulty in walking, not elsewhere classified (R26.2)    Time: 9470-9628 PT Time Calculation (min) (ACUTE ONLY): 18 min   Charges:   PT Evaluation $PT Eval Moderate Complexity: 1 Procedure     PT G Codes:        Blase Beckner E Penven-Crew 03/10/2017, 1:26 PM

## 2017-03-10 NOTE — Progress Notes (Signed)
   Subjective:  Patient states he has not had further passage of clots but is still having some discomfort with urine passage. He denies dizziness, chest pain, shortness of breath. He still endorses poor appetite. He still seemed unclear that his hematuria was caused by his prostate cancer and that he actually does have prostate cancer.  Objective:  Vital signs in last 24 hours: Vitals:   03/09/17 1930 03/09/17 2029 03/10/17 0223 03/10/17 0634  BP: 114/63 (!) 147/48 (!) 107/52 (!) 101/56  Pulse: 68 75 82 86  Resp: 11 19 18 18   Temp:  97.7 F (36.5 C) 98.6 F (37 C) 98.7 F (37.1 C)  TempSrc:  Oral    SpO2: 97% 100% 95% 97%  Weight:  238 lb 8 oz (108.2 kg)    Height:  5\' 9"  (1.753 m)     Constitutional: NAD, pleasant, lying in bed comfortably CV: RRR, no murmurs, rubs or gallops appreciated, 2+ pitting edema Resp: CTAB, no increased work of breathing Abd: Soft, NDNT MSK: tenderness to palpation in midthoracic spine and left 8th rib  Assessment/Plan:  Principal Problem:   Prostate cancer metastatic to bone [facility]) Active Problems:   Hematuria   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 He has stopped passing clots and is continuing to have good passage of blood/urine which is reassuring that he is not obstructed. I have spoken to urology by phone who would like to see him outpatient since he appears stable at this time; agree with stressing f/u with med-onc and urology  --f/u with Dr. Alen Blew on 6/25 --f/u with urology next week  Metastatic prostate cancer: Presumed first diagnosis in 1990's; since then confirmed by biopsy in 2015. Patient s/p palliative radiation. He is scheduled to see med onc on 6/25.  --f/u with Dr. Alen Blew on Monday 6/25 --will prescribe hydrocodone --avoid NSAIDs in setting of hematuria at this  time --will prescribe lidocaine gel/patch at discharge for rib pain  Microcytic anemia: Stable today with Hgb 9.1.   LE edema: Patient with no history of heart disease, liver disease, renal disease and no evidence of these on exam, labs, and imaging. It is likely dependent in nature. --compression stockings  Dispo: Anticipated discharge today.   Alphonzo Grieve, MD 03/10/2017, 2:01 PM Pager [phone]

## 2017-03-10 NOTE — Care Management Obs Status (Signed)
[place] NOTIFICATION   Patient Details  Name: Gregory Chaney MRN: [id number] Date of Birth: 06/30/1941   Medicare Observation Status Notification Given:       Marilu Favre, RN 03/10/2017, 2:58 PM

## 2017-03-10 NOTE — Care Management Note (Signed)
Case Management Note  Patient Details  Name: Gregory Chaney MRN: [id number] Date of Birth: 21-Jan-1941  Subjective/Objective:                    Action/Plan:   Expected Discharge Date:  03/10/17               Expected Discharge Plan:  Home/Self Care  In-House Referral:     Discharge planning Services  CM Consult  Post Acute Care Choice:  Durable Medical Equipment Choice offered to:     DME Arranged:  [place] rolling DME Agency:  [place]:    [facility] Agency:     Status of Service:  Completed, signed off  If discussed at [place] of Stay Meetings, dates discussed:    Additional Comments:  Marilu Favre, RN 03/10/2017, 4:07 PM

## 2017-03-14 ENCOUNTER — Telehealth: Payer: Self-pay | Admitting: Oncology

## 2017-03-14 ENCOUNTER — Ambulatory Visit (HOSPITAL_BASED_OUTPATIENT_CLINIC_OR_DEPARTMENT_OTHER): Payer: Medicare PPO | Admitting: Oncology

## 2017-03-14 VITALS — BP 142/78 | HR 88 | Temp 98.0°F | Resp 20 | Ht 69.0 in | Wt 239.7 lb

## 2017-03-14 DIAGNOSIS — R109 Unspecified abdominal pain: Secondary | ICD-10-CM | POA: Diagnosis not present

## 2017-03-14 DIAGNOSIS — C61 Malignant neoplasm of prostate: Secondary | ICD-10-CM

## 2017-03-14 DIAGNOSIS — C7951 Secondary malignant neoplasm of bone: Secondary | ICD-10-CM

## 2017-03-14 DIAGNOSIS — M898X9 Other specified disorders of bone, unspecified site: Secondary | ICD-10-CM | POA: Diagnosis not present

## 2017-03-14 NOTE — Progress Notes (Signed)
Hematology and Oncology Follow Up Visit  Gregory Chaney [id number] 1941-02-28 76 y.o. 03/14/2017 10:21 AM Gregory Chaney, Gregory Stacks, NP   Gregory Chaney with prostate cancer. His most recent prostate biopsy was done in 2015 by Gregory Chaney on 06/13/2014. At that time his PSA was 4.9 and he was found to have a Gleason score 4+3 = 7. He had an enlarged prostate and 100 g. He did not receive any definitive therapy at that time and have not had further urological follow-up. 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   Prior Therapy:  Radiation therapy to Lower spine L3-S4 treated to 30 Gy in 10 fractions between 11/01/16 - 11/12/16.   Current therapy: Under consideration for systemic therapy.  Interim History: Gregory Chaney presents today for a follow-up visit. He is a gentleman with advanced prostate cancer that has refused treatment and follow-up on multiple occasions. He agreed to receive radiation therapy which was completed in February 2018. He was seen in the emergency department on multiple occasions was results of which was also on 03/10/2017 for hematuria and flank pain. Since his trip to the emergency department, he has been doing reasonably fair. He does report flank pain which is manageable with hydrocodone at this time. He is no longer reporting hematuria. He still ambulating without any difficulties and has not reported any falls or syncope. His performance status is slowly declining.  He does not report any headaches, blurry vision, peripheral neuropathy. He does not report any chest pain, palpitation, orthopnea or leg edema. He does not report any cough, wheezing or hemoptysis. He is not reporting nausea, vomiting or abdominal pain. He does not report any frequency urgency or hesitancy. He does not report any skeletal complaints. Remaining review of systems unremarkable.    Medications: I have reviewed the patient's current medications.  Current  Outpatient Prescriptions  Medication Sig Dispense Refill  . capsaicin (ZOSTRIX) 0.025 % cream Apply topically 2 (two) times daily. 60 g 3  . HYDROcodone-acetaminophen (NORCO/VICODIN) 5-325 MG tablet Take 1 tablet by mouth every 6 (six) hours as needed for moderate pain. 30 tablet 0  . polyethylene glycol (MIRALAX / GLYCOLAX) packet Take 17 g by mouth daily as needed for mild constipation. 14 each 0   No current facility-administered medications for this visit.      Allergies:  Allergies  Allergen Reactions  . Cephalexin Other (See Comments)    hallucinations  . Hydrocodone Other (See Comments)    Confusion,my mind doesn't work well   . Morphine Other (See Comments)     Confusion. My mind does not work right  . Flomax [Tamsulosin Hcl] Other (See Comments)    My mind doesn't work well  . Percocet [Oxycodone-Acetaminophen] Nausea Only    Past Medical History, Surgical history, Social history, and Family History were reviewed and updated.  Physical Exam: Blood pressure (!) 142/78, pulse 88, temperature 98 F (36.7 C), temperature source Oral, resp. rate 20, height 5\' 9"  (1.753 m), weight 239 lb 11.2 oz (108.7 kg), SpO2 97 %. ECOG: 1 General appearance: alert and cooperative Head: Normocephalic, without obvious abnormality Neck: no adenopathy Lymph nodes: Cervical, supraclavicular, and axillary nodes normal. Heart:regular rate and rhythm, S1, S2 normal, no murmur, click, rub or gallop Lung:chest clear, no wheezing, rales, normal symmetric air entry, Heart exam - S1, S2 normal, no murmur, no gallop, rate regular Abdomin: soft, non-tender, without masses or organomegaly EXT:no erythema, induration, or nodules   Lab Results: Lab Results  Component Value Date   WBC 5.3 03/10/2017   HGB 9.1 (L) 03/10/2017   HCT 28.4 (L) 03/10/2017   MCV 65.4 (L) 03/10/2017   PLT 159 03/10/2017     Chemistry      Component Value Date/Time   NA 141 03/10/2017 0845   K 3.6 03/10/2017 0845    CL 105 03/10/2017 0845   CO2 26 03/10/2017 0845   BUN 11 03/10/2017 0845   CREATININE 1.01 03/10/2017 0845      Component Value Date/Time   CALCIUM 9.2 03/10/2017 0845   ALKPHOS 95 03/10/2017 0845   AST 19 03/10/2017 0845   ALT 11 (L) 03/10/2017 0845   BILITOT 1.2 03/10/2017 0845       Radiological Studies: EXAM: CT ABDOMEN AND PELVIS WITHOUT CONTRAST  TECHNIQUE: Multidetector CT imaging of the abdomen and pelvis was performed following the standard protocol without IV contrast.  COMPARISON:  None.  FINDINGS: Lower chest: Mild bibasilar atelectatic changes are noted.  Hepatobiliary: The liver is within normal limits. Dependent density is noted in the gallbladder which may represent small stones or gallbladder sludge.  Pancreas: Unremarkable. No pancreatic ductal dilatation or surrounding inflammatory changes.  Spleen: Normal in size without focal abnormality.  Adrenals/Urinary Tract: The adrenal glands are within normal limits. The right kidney demonstrates no obstructive changes or calculi. The left kidney demonstrates no calculi or obstructive changes. The bladder is well distended and demonstrates some intraluminal hyperdensity a portion of which may represent thrombus given the patient's clinical history. The prostate is again enlarged with an irregular contour or posteriorly on the left consistent with the patient's given clinical history of prostate carcinoma. Some in growth into the bladder is likely present.  Stomach/Bowel: Scattered diverticular change of the colon is noted. No obstructive changes or inflammatory changes are seen. The appendix is within normal limits.  Vascular/Lymphatic: Aortic atherosclerosis. No enlarged abdominal or pelvic lymph nodes.  Reproductive: The prostate is enlarged as described above consistent with the given clinical history of metastatic prostate carcinoma.  Other: No abdominal wall hernia or abnormality. No  abdominopelvic ascites.  Musculoskeletal: Mixed lytic and sclerotic lesions are noted throughout the lumbar spine, thoracic spine consistent with the given clinical history of metastatic prostate carcinoma.  IMPRESSION: Hyperdense material within the bladder likely representing thrombus. There is also likely some invasion of the bladder wall related to the patient's known prostate carcinoma.  Enlargement of the prostate posteriorly on the left is noted consistent with the given clinical history.  Changes in the bony structures consistent with prior metastatic disease.  Dependent changes within the gallbladder likely representing small stones or gallbladder sludge.   Impression and Plan:  12 year Chaney gentleman with the following issues:  1. Prostate cancer: the exact initial diagnosis is unclear to me that he has known about it since the mid 92s. His most recent prostate biopsy was done in 2015 by Gregory Chaney on 06/13/2014. At that time his PSA was 4.9 and he was found to have a Gleason score 4+3 = 7. He had an enlarged prostate and 100 g. He did not receive any definitive therapy at that time and have not had further urological follow-up.  He presented in February 2018 with back and right-sided hip pain with diffuse metastasis and his lumbar and thoracic spine. He completed palliative radiation therapy to the spine.  Risks and benefits of systemic therapy including androgen deprivation therapy in addition to other options were reviewed today. He has been adamant in the past that  he would not take any treatment. I reiterated the importance of doing this at this time otherwise his cancer will continue to create more symptoms. Progression of his disease could cause more pain, weight loss, debilitation and ultimately severe morbidity and death.  I discussed the risks and benefits of starting androgen deprivation therapy with Lupron today. These complications include fatigue, weight  gain and hot flashes. Increase in his pain as a part of the flare phenomena was also reviewed. I recommend adding Casodex to his treatment.  After discussion today is agreeable to try Lupron every initially. He declined any other treatments.  He will receive 30 mg injection every 4 months starting in the immediate future.  2. Bone pain: Manageable with hydrocodone at this time.  3. Bone directed therapy: We will address Delton See with him in the future.  4. Hematuria: Likely related to prostate cancer invasion into the bladder. Patient to follow with urology regarding this issue.  5. Follow-up: In the next 6 to 8 to follow his progress.   [facility], MD 6/25/201810:21 AM

## 2017-03-14 NOTE — Telephone Encounter (Signed)
Appointments scheduled per 03/14/17 los. °Patient was given a copy of the AVS report and appointment schedule, per 03/14/17 los. °

## 2017-03-14 NOTE — Addendum Note (Signed)
Addended by: Randolm Idol on: 03/14/2017 10:54 AM   Modules accepted: Orders

## 2017-03-16 ENCOUNTER — Other Ambulatory Visit: Payer: Medicare PPO

## 2017-03-18 ENCOUNTER — Ambulatory Visit (HOSPITAL_BASED_OUTPATIENT_CLINIC_OR_DEPARTMENT_OTHER): Payer: Medicare PPO

## 2017-03-18 ENCOUNTER — Other Ambulatory Visit (HOSPITAL_BASED_OUTPATIENT_CLINIC_OR_DEPARTMENT_OTHER): Payer: Medicare PPO

## 2017-03-18 VITALS — BP 157/68 | HR 94 | Temp 97.9°F | Resp 18

## 2017-03-18 DIAGNOSIS — C61 Malignant neoplasm of prostate: Secondary | ICD-10-CM

## 2017-03-18 DIAGNOSIS — C7951 Secondary malignant neoplasm of bone: Secondary | ICD-10-CM | POA: Diagnosis not present

## 2017-03-18 DIAGNOSIS — Z5111 Encounter for antineoplastic chemotherapy: Secondary | ICD-10-CM | POA: Diagnosis not present

## 2017-03-18 LAB — CBC WITH DIFFERENTIAL/PLATELET
BASO%: 0.2 % (ref 0.0–2.0)
Basophils Absolute: 0 10*3/uL (ref 0.0–0.1)
EOS ABS: 0 10*3/uL (ref 0.0–0.5)
EOS%: 0.6 % (ref 0.0–7.0)
HCT: 27.7 % — ABNORMAL LOW (ref 38.4–49.9)
HGB: 9 g/dL — ABNORMAL LOW (ref 13.0–17.1)
LYMPH%: 28.8 % (ref 14.0–49.0)
MCH: 21.4 pg — ABNORMAL LOW (ref 27.2–33.4)
MCHC: 32.5 g/dL (ref 32.0–36.0)
MCV: 66 fL — ABNORMAL LOW (ref 79.3–98.0)
MONO#: 0.8 10*3/uL (ref 0.1–0.9)
MONO%: 13.5 % (ref 0.0–14.0)
NEUT#: 3.5 10*3/uL (ref 1.5–6.5)
NEUT%: 56.9 % (ref 39.0–75.0)
NRBC: 0 % (ref 0–0)
Platelets: 184 10*3/uL (ref 140–400)
RBC: 4.2 10*6/uL (ref 4.20–5.82)
RDW: 16.4 % — AB (ref 11.0–14.6)
WBC: 6.2 10*3/uL (ref 4.0–10.3)
lymph#: 1.8 10*3/uL (ref 0.9–3.3)

## 2017-03-18 LAB — COMPREHENSIVE METABOLIC PANEL
ALT: 13 U/L (ref 0–55)
AST: 20 U/L (ref 5–34)
Albumin: 2.8 g/dL — ABNORMAL LOW (ref 3.5–5.0)
Alkaline Phosphatase: 143 U/L (ref 40–150)
Anion Gap: 12 mEq/L — ABNORMAL HIGH (ref 3–11)
BILIRUBIN TOTAL: 0.76 mg/dL (ref 0.20–1.20)
BUN: 13.4 mg/dL (ref 7.0–26.0)
CHLORIDE: 103 meq/L (ref 98–109)
CO2: 27 meq/L (ref 22–29)
CREATININE: 1.1 mg/dL (ref 0.7–1.3)
Calcium: 9.8 mg/dL (ref 8.4–10.4)
EGFR: 74 mL/min/{1.73_m2} — ABNORMAL LOW (ref >90)
GLUCOSE: 78 mg/dL (ref 70–140)
Potassium: 3.7 mEq/L (ref 3.5–5.1)
Sodium: 142 mEq/L (ref 136–145)
TOTAL PROTEIN: 6.4 g/dL (ref 6.4–8.3)

## 2017-03-18 MED ORDER — LEUPROLIDE ACETATE (4 MONTH) 30 MG IM KIT
30.0000 mg | PACK | Freq: Once | INTRAMUSCULAR | Status: AC
  Administered 2017-03-18: 30 mg via INTRAMUSCULAR
  Filled 2017-03-18: qty 30

## 2017-03-18 NOTE — Patient Instructions (Signed)
Leuprolide depot injection What is this medicine? LEUPROLIDE (loo PROE lide) is a man-made protein that acts like a natural hormone in the body. It decreases testosterone in men and decreases estrogen in women. In men, this medicine is used to treat advanced prostate cancer. In women, some forms of this medicine may be used to treat endometriosis, uterine fibroids, or other male hormone-related problems. This medicine may be used for other purposes; ask your health care provider or pharmacist if you have questions. COMMON BRAND NAME(S): Eligard, Lupron Depot, Lupron Depot-Ped, Viadur What should I tell my health care provider before I take this medicine? They need to know if you have any of these conditions: -diabetes -heart disease or previous heart attack -high blood pressure -high cholesterol -mental illness -osteoporosis -pain or difficulty passing urine -seizures -spinal cord metastasis -stroke -suicidal thoughts, plans, or attempt; a previous suicide attempt by you or a family member -tobacco smoker -unusual vaginal bleeding (women) -an unusual or allergic reaction to leuprolide, benzyl alcohol, other medicines, foods, dyes, or preservatives -pregnant or trying to get pregnant -breast-feeding How should I use this medicine? This medicine is for injection into a muscle or for injection under the skin. It is given by a health care professional in a hospital or clinic setting. The specific product will determine how it will be given to you. Make sure you understand which product you receive and how often you will receive it. Talk to your pediatrician regarding the use of this medicine in children. Special care may be needed. Overdosage: If you think you have taken too much of this medicine contact a poison control center or emergency room at once. NOTE: This medicine is only for you. Do not share this medicine with others. What if I miss a dose? It is important not to miss a dose.  Call your doctor or health care professional if you are unable to keep an appointment. Depot injections: Depot injections are given either once-monthly, every 12 weeks, every 16 weeks, or every 24 weeks depending on the product you are prescribed. The product you are prescribed will be based on if you are male or male, and your condition. Make sure you understand your product and dosing. What may interact with this medicine? Do not take this medicine with any of the following medications: -chasteberry This medicine may also interact with the following medications: -herbal or dietary supplements, like black cohosh or DHEA -male hormones, like estrogens or progestins and birth control pills, patches, rings, or injections -male hormones, like testosterone This list may not describe all possible interactions. Give your health care provider a list of all the medicines, herbs, non-prescription drugs, or dietary supplements you use. Also tell them if you smoke, drink alcohol, or use illegal drugs. Some items may interact with your medicine. What should I watch for while using this medicine? Visit your doctor or health care professional for regular checks on your progress. During the first weeks of treatment, your symptoms may get worse, but then will improve as you continue your treatment. You may get hot flashes, increased bone pain, increased difficulty passing urine, or an aggravation of nerve symptoms. Discuss these effects with your doctor or health care professional, some of them may improve with continued use of this medicine. Male patients may experience a menstrual cycle or spotting during the first months of therapy with this medicine. If this continues, contact your doctor or health care professional. What side effects may I notice from receiving this medicine? Side   effects that you should report to your doctor or health care professional as soon as possible: -allergic reactions like skin  rash, itching or hives, swelling of the face, lips, or tongue -breathing problems -chest pain -depression or memory disorders -pain in your legs or groin -pain at site where injected or implanted -seizures -severe headache -swelling of the feet and legs -suicidal thoughts or other mood changes -visual changes -vomiting Side effects that usually do not require medical attention (report to your doctor or health care professional if they continue or are bothersome): -breast swelling or tenderness -decrease in sex drive or performance -diarrhea -hot flashes -loss of appetite -muscle, joint, or bone pains -nausea -redness or irritation at site where injected or implanted -skin problems or acne This list may not describe all possible side effects. Call your doctor for medical advice about side effects. You may report side effects to FDA at 1-800-FDA-1088. Where should I keep my medicine? This drug is given in a hospital or clinic and will not be stored at home. NOTE: This sheet is a summary. It may not cover all possible information. If you have questions about this medicine, talk to your doctor, pharmacist, or health care provider.  2018 Elsevier/Gold Standard (2016-02-19 09:45:53)  

## 2017-03-19 LAB — PSA: PROSTATE SPECIFIC AG, SERUM: 730.6 ng/mL — AB (ref 0.0–4.0)

## 2017-03-19 LAB — TESTOSTERONE: Testosterone, Serum: 318 ng/dL (ref 264–916)

## 2017-03-30 ENCOUNTER — Ambulatory Visit: Payer: Self-pay | Admitting: Radiation Oncology

## 2017-04-05 ENCOUNTER — Other Ambulatory Visit: Payer: Self-pay | Admitting: *Deleted

## 2017-04-05 MED ORDER — HYDROCODONE-ACETAMINOPHEN 5-325 MG PO TABS: 1.0000 | ORAL_TABLET | Freq: Four times a day (QID) | ORAL | 0 refills | Status: DC | PRN

## 2017-04-05 MED ORDER — TRAMADOL HCL 50 MG PO TABS: 50.0000 mg | ORAL_TABLET | Freq: Four times a day (QID) | ORAL | 0 refills | Status: DC | PRN

## 2017-04-06 ENCOUNTER — Other Ambulatory Visit: Payer: Self-pay | Admitting: Oncology

## 2017-05-10 ENCOUNTER — Ambulatory Visit (HOSPITAL_BASED_OUTPATIENT_CLINIC_OR_DEPARTMENT_OTHER): Payer: Medicare PPO | Admitting: Oncology

## 2017-05-10 ENCOUNTER — Other Ambulatory Visit (HOSPITAL_BASED_OUTPATIENT_CLINIC_OR_DEPARTMENT_OTHER): Payer: Medicare PPO

## 2017-05-10 ENCOUNTER — Telehealth: Payer: Self-pay | Admitting: Oncology

## 2017-05-10 ENCOUNTER — Other Ambulatory Visit: Payer: Medicare PPO

## 2017-05-10 ENCOUNTER — Ambulatory Visit: Payer: Medicare PPO | Admitting: Oncology

## 2017-05-10 VITALS — BP 145/77 | HR 82 | Temp 98.7°F | Resp 20 | Ht 69.0 in | Wt 246.5 lb

## 2017-05-10 DIAGNOSIS — C7951 Secondary malignant neoplasm of bone: Secondary | ICD-10-CM

## 2017-05-10 DIAGNOSIS — C61 Malignant neoplasm of prostate: Secondary | ICD-10-CM

## 2017-05-10 DIAGNOSIS — R609 Edema, unspecified: Secondary | ICD-10-CM

## 2017-05-10 DIAGNOSIS — C7911 Secondary malignant neoplasm of bladder: Principal | ICD-10-CM

## 2017-05-10 LAB — CBC WITH DIFFERENTIAL/PLATELET
BASO%: 0.2 % (ref 0.0–2.0)
BASOS ABS: 0 10*3/uL (ref 0.0–0.1)
EOS%: 2.6 % (ref 0.0–7.0)
Eosinophils Absolute: 0.1 10*3/uL (ref 0.0–0.5)
HEMATOCRIT: 28.1 % — AB (ref 38.4–49.9)
HGB: 9 g/dL — ABNORMAL LOW (ref 13.0–17.1)
LYMPH#: 1.6 10*3/uL (ref 0.9–3.3)
LYMPH%: 33.5 % (ref 14.0–49.0)
MCH: 22.3 pg — AB (ref 27.2–33.4)
MCHC: 32 g/dL (ref 32.0–36.0)
MCV: 69.6 fL — ABNORMAL LOW (ref 79.3–98.0)
MONO#: 0.5 10*3/uL (ref 0.1–0.9)
MONO%: 10.5 % (ref 0.0–14.0)
NEUT#: 2.5 10*3/uL (ref 1.5–6.5)
NEUT%: 53.2 % (ref 39.0–75.0)
Platelets: 223 10*3/uL (ref 140–400)
RBC: 4.04 10*6/uL — AB (ref 4.20–5.82)
RDW: 20.7 % — ABNORMAL HIGH (ref 11.0–14.6)
WBC: 4.7 10*3/uL (ref 4.0–10.3)

## 2017-05-10 LAB — COMPREHENSIVE METABOLIC PANEL
ALT: <6 U/L (ref 0–55)
ANION GAP: 5 meq/L (ref 3–11)
AST: 12 U/L (ref 5–34)
Albumin: 2.6 g/dL — ABNORMAL LOW (ref 3.5–5.0)
Alkaline Phosphatase: 738 U/L — ABNORMAL HIGH (ref 40–150)
BUN: 6.9 mg/dL — AB (ref 7.0–26.0)
CHLORIDE: 109 meq/L (ref 98–109)
CO2: 28 meq/L (ref 22–29)
Calcium: 8.6 mg/dL (ref 8.4–10.4)
Creatinine: 0.8 mg/dL (ref 0.7–1.3)
EGFR: >90 mL/min/{1.73_m2} (ref >90)
Glucose: 104 mg/dl (ref 70–140)
Potassium: 3.5 mEq/L (ref 3.5–5.1)
Sodium: 142 mEq/L (ref 136–145)
Total Bilirubin: 0.58 mg/dL (ref 0.20–1.20)
Total Protein: 5.4 g/dL — ABNORMAL LOW (ref 6.4–8.3)

## 2017-05-10 NOTE — Progress Notes (Signed)
Hematology and Oncology Follow Up Visit  Gregory Chaney [id number] 09-Jun-1941 76 y.o. 05/10/2017 12:22 PM [name], Rupert Stacks, NP   Principle Diagnosis: 76 year old with prostate cancer. His most recent prostate biopsy was done in 2015 by Dr. Ky Barban on 06/13/2014. At that time his PSA was 4.9 and he was found to have a Gleason score 4+3 = 7. He had an enlarged prostate and 100 g. He did not receive any definitive therapy at that time and have not had further urological follow-up. 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   Prior Therapy:  Radiation therapy to Lower spine L3-S4 treated to 30 Gy in 10 fractions between 11/01/16 - 11/12/16.   Current therapy: Lupron 30 mg every 4 months. First injection given on 03/18/2017.  Interim History: [name] presents today for a follow-up visit. Since the last visit, he received Lupron at 30 mg daily and tolerated it reasonably well. He denied any complications associated with this treatment including hot flashes or cognitive decline. His pain completely resolved at this time and no longer reporting any back pain, shoulder pain or hip pain. He does report lower extremity edema and has been chronic in nature. He continues to attend to her activities of daily living including ambulation is without any falls or syncope. He denied any urination difficulties.  He does not report any headaches, blurry vision, peripheral neuropathy. He does not report any chest pain, palpitation, orthopnea or leg edema. He does not report any cough, wheezing or hemoptysis. He is not reporting nausea, vomiting or abdominal pain. He does not report any frequency urgency or hesitancy. He does not report any skeletal complaints. Remaining review of systems unremarkable.    Medications: I have reviewed the patient's current medications.  Current Outpatient Prescriptions  Medication Sig Dispense Refill  . traMADol (ULTRAM) 50 MG tablet Take 1 tablet (50  mg total) by mouth every 6 (six) hours as needed. 30 tablet 0   No current facility-administered medications for this visit.      Allergies:  Allergies  Allergen Reactions  . Cephalexin Other (See Comments)    hallucinations  . Hydrocodone Other (See Comments)    Confusion,my mind doesn't work well   . Morphine Other (See Comments)     Confusion. My mind does not work right  . Flomax [Tamsulosin Hcl] Other (See Comments)    My mind doesn't work well  . Percocet [Oxycodone-Acetaminophen] Nausea Only    Past Medical History, Surgical history, Social history, and Family History were reviewed and updated.  Physical Exam: Blood pressure (!) 145/77, pulse 82, temperature 98.7 F (37.1 C), temperature source Oral, resp. rate 20, height 5\' 9"  (1.753 m), weight 246 lb 8 oz (111.8 kg), SpO2 99 %. ECOG: 1 General appearance: alert and cooperative appeared without distress. Head: Normocephalic, without obvious abnormality Neck: no adenopathy Lymph nodes: Cervical, supraclavicular, and axillary nodes normal. Heart:regular rate and rhythm, S1, S2 normal, no murmur, click, rub or gallop Lung:chest clear, no wheezing, rales, normal symmetric air entry. Abdomin: soft, non-tender, without masses or organomegaly EXT:no erythema, induration, or nodules   Lab Results: Lab Results  Component Value Date   WBC 4.7 05/10/2017   HGB 9.0 (L) 05/10/2017   HCT 28.1 (L) 05/10/2017   MCV 69.6 (L) 05/10/2017   PLT 223 05/10/2017     Chemistry      Component Value Date/Time   NA 142 03/18/2017 1200   K 3.7 03/18/2017 1200   CL 105  03/10/2017 0845   CO2 27 03/18/2017 1200   BUN 13.4 03/18/2017 1200   CREATININE 1.1 03/18/2017 1200      Component Value Date/Time   CALCIUM 9.8 03/18/2017 1200   ALKPHOS 143 03/18/2017 1200   AST 20 03/18/2017 1200   ALT 13 03/18/2017 1200   BILITOT 0.76 03/18/2017 1200        Impression and Plan:  76 year old gentleman with the following  issues:  1. Prostate cancer: the exact initial diagnosis is unclear to me that he has known about it since the mid 12s. His most recent prostate biopsy was done in 2015 by Dr. Ky Barban on 06/13/2014. At that time his PSA was 4.9 and he was found to have a Gleason score 4+3 = 7. He had an enlarged prostate and 100 g. He did not receive any definitive therapy at that time and have not had further urological follow-up.  He is currently receiving Lupron 30 mg every 4 months started in June 2018. After one injection he tolerated therapy reasonably well with excellent improvement in his clinical status.  The plan is to continue with this injection every 4 months and we'll continue to monitor his PSA. Additional therapy may be required in the form of systemic chemotherapy or Zytiga. At this time he is very hesitant to add any additional therapy.  2. Bone pain: Resolved at this time after treating his prostate cancer with radiation and Lupron.  3. Bone directed therapy: He will need a dental clearance before consideration for this therapy. We will continue to address that with him in future visits.  4. Hematuria: Resolved at this time.  5. Follow-up: In 2 months to receive Lupron injection. And in 6 months for a follow-up visit.   [facility], MD 8/21/201812:22 PM

## 2017-05-10 NOTE — Telephone Encounter (Signed)
Gave pt avs and calendars for upcoming appts.  °

## 2017-05-11 LAB — PSA: Prostate Specific Ag, Serum: 7 ng/mL — ABNORMAL HIGH (ref 0.0–4.0)

## 2017-07-19 ENCOUNTER — Ambulatory Visit (HOSPITAL_BASED_OUTPATIENT_CLINIC_OR_DEPARTMENT_OTHER): Payer: Medicare PPO

## 2017-07-19 VITALS — BP 165/84 | HR 67 | Temp 97.0°F | Resp 20

## 2017-07-19 DIAGNOSIS — Z5111 Encounter for antineoplastic chemotherapy: Secondary | ICD-10-CM | POA: Diagnosis not present

## 2017-07-19 DIAGNOSIS — C7951 Secondary malignant neoplasm of bone: Secondary | ICD-10-CM | POA: Diagnosis not present

## 2017-07-19 DIAGNOSIS — C61 Malignant neoplasm of prostate: Secondary | ICD-10-CM

## 2017-07-19 MED ORDER — LEUPROLIDE ACETATE (4 MONTH) 30 MG IM KIT
30.0000 mg | PACK | Freq: Once | INTRAMUSCULAR | Status: AC
  Administered 2017-07-19: 30 mg via INTRAMUSCULAR
  Filled 2017-07-19: qty 30

## 2017-07-19 NOTE — Patient Instructions (Signed)
Leuprolide depot injection What is this medicine? LEUPROLIDE (loo PROE lide) is a man-made protein that acts like a natural hormone in the body. It decreases testosterone in men and decreases estrogen in women. In men, this medicine is used to treat advanced prostate cancer. In women, some forms of this medicine may be used to treat endometriosis, uterine fibroids, or other male hormone-related problems. This medicine may be used for other purposes; ask your health care provider or pharmacist if you have questions. COMMON BRAND NAME(S): Eligard, Lupron Depot, Lupron Depot-Ped, Viadur What should I tell my health care provider before I take this medicine? They need to know if you have any of these conditions: -diabetes -heart disease or previous heart attack -high blood pressure -high cholesterol -mental illness -osteoporosis -pain or difficulty passing urine -seizures -spinal cord metastasis -stroke -suicidal thoughts, plans, or attempt; a previous suicide attempt by you or a family member -tobacco smoker -unusual vaginal bleeding (women) -an unusual or allergic reaction to leuprolide, benzyl alcohol, other medicines, foods, dyes, or preservatives -pregnant or trying to get pregnant -breast-feeding How should I use this medicine? This medicine is for injection into a muscle or for injection under the skin. It is given by a health care professional in a hospital or clinic setting. The specific product will determine how it will be given to you. Make sure you understand which product you receive and how often you will receive it. Talk to your pediatrician regarding the use of this medicine in children. Special care may be needed. Overdosage: If you think you have taken too much of this medicine contact a poison control center or emergency room at once. NOTE: This medicine is only for you. Do not share this medicine with others. What if I miss a dose? It is important not to miss a dose.  Call your doctor or health care professional if you are unable to keep an appointment. Depot injections: Depot injections are given either once-monthly, every 12 weeks, every 16 weeks, or every 24 weeks depending on the product you are prescribed. The product you are prescribed will be based on if you are male or male, and your condition. Make sure you understand your product and dosing. What may interact with this medicine? Do not take this medicine with any of the following medications: -chasteberry This medicine may also interact with the following medications: -herbal or dietary supplements, like black cohosh or DHEA -male hormones, like estrogens or progestins and birth control pills, patches, rings, or injections -male hormones, like testosterone This list may not describe all possible interactions. Give your health care provider a list of all the medicines, herbs, non-prescription drugs, or dietary supplements you use. Also tell them if you smoke, drink alcohol, or use illegal drugs. Some items may interact with your medicine. What should I watch for while using this medicine? Visit your doctor or health care professional for regular checks on your progress. During the first weeks of treatment, your symptoms may get worse, but then will improve as you continue your treatment. You may get hot flashes, increased bone pain, increased difficulty passing urine, or an aggravation of nerve symptoms. Discuss these effects with your doctor or health care professional, some of them may improve with continued use of this medicine. Male patients may experience a menstrual cycle or spotting during the first months of therapy with this medicine. If this continues, contact your doctor or health care professional. What side effects may I notice from receiving this medicine? Side   effects that you should report to your doctor or health care professional as soon as possible: -allergic reactions like skin  rash, itching or hives, swelling of the face, lips, or tongue -breathing problems -chest pain -depression or memory disorders -pain in your legs or groin -pain at site where injected or implanted -seizures -severe headache -swelling of the feet and legs -suicidal thoughts or other mood changes -visual changes -vomiting Side effects that usually do not require medical attention (report to your doctor or health care professional if they continue or are bothersome): -breast swelling or tenderness -decrease in sex drive or performance -diarrhea -hot flashes -loss of appetite -muscle, joint, or bone pains -nausea -redness or irritation at site where injected or implanted -skin problems or acne This list may not describe all possible side effects. Call your doctor for medical advice about side effects. You may report side effects to FDA at 1-800-FDA-1088. Where should I keep my medicine? This drug is given in a hospital or clinic and will not be stored at home. NOTE: This sheet is a summary. It may not cover all possible information. If you have questions about this medicine, talk to your doctor, pharmacist, or health care provider.  2018 Elsevier/Gold Standard (2016-02-19 09:45:53)  

## 2017-08-16 ENCOUNTER — Telehealth: Payer: Self-pay | Admitting: Oncology

## 2017-08-16 NOTE — Telephone Encounter (Signed)
08/16/2017 faxed most recent progress notes and labs to Jeanmarie Plant with [facility] @ [phone]

## 2017-11-22 ENCOUNTER — Ambulatory Visit: Payer: Medicare PPO | Admitting: Oncology

## 2017-11-22 ENCOUNTER — Telehealth: Payer: Self-pay

## 2017-11-22 ENCOUNTER — Other Ambulatory Visit: Payer: Medicare PPO

## 2017-11-22 ENCOUNTER — Ambulatory Visit: Payer: Medicare PPO

## 2017-11-22 NOTE — Telephone Encounter (Signed)
Patietn called to cancel today's appts.

## 2017-11-24 ENCOUNTER — Telehealth: Payer: Self-pay | Admitting: Oncology

## 2017-11-24 NOTE — Telephone Encounter (Signed)
Unable to reach patient called and left message for patient to call back to r./s apt . Per 3/5 sch message.

## 2018-01-09 ENCOUNTER — Encounter: Payer: Self-pay | Admitting: *Deleted

## 2018-08-23 IMAGING — MR MR LUMBAR SPINE WO/W CM
4 of 7 series · 17 of 48 positions shown · IV contrast (multihance)
Comparison: CT lumbar spine October 23, 2016 at [DATE] p.m.

CLINICAL DATA: Acute onset low back pain, hip and RIGHT leg pain.
History of prostate cancer. Followup metastasis.

EXAM:
MRI LUMBAR SPINE WITHOUT AND WITH CONTRAST
TECHNIQUE: Multiplanar and multiecho pulse sequences of the lumbar spine were
obtained without and with intravenous contrast.
CONTRAST:  20mL MULTIHANCE GADOBENATE DIMEGLUMINE 529 MG/ML IV SOLN

[Series 7: T2 · sagittal · 4.0mm · 0.55mm/px · 4 of 15 slices shown (1 of 2)]
[im 1/15]
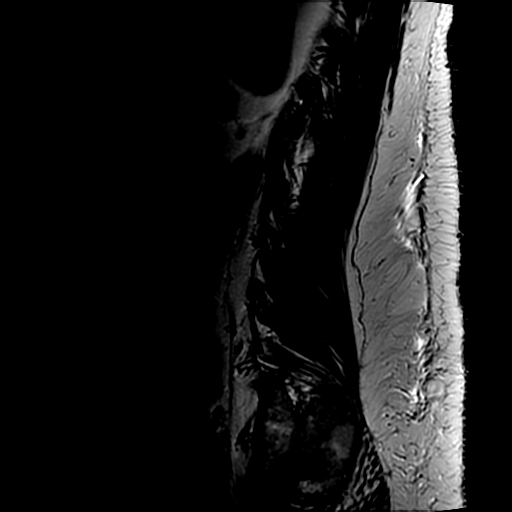
[im 5/15]
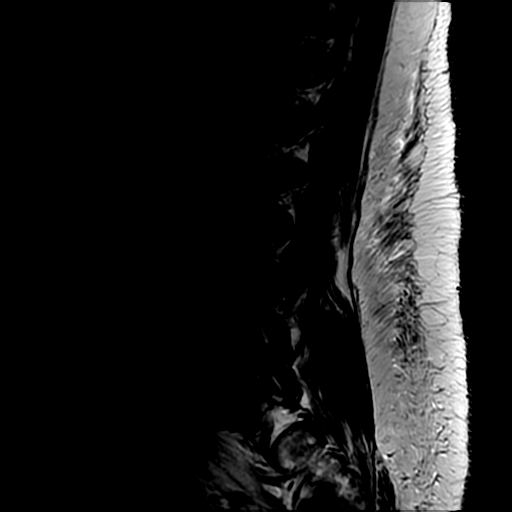
[im 10/15]
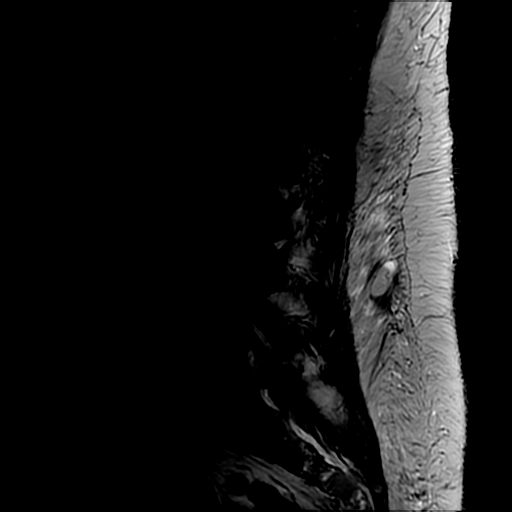
[im 15/15]
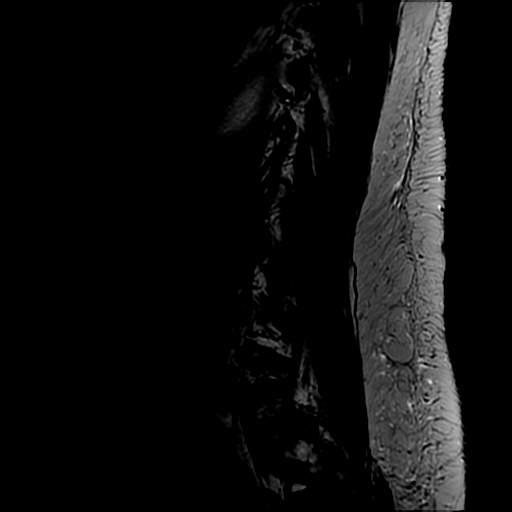

[Series 8: T1 · sagittal · 4.0mm · 0.55mm/px · 3 of 15 slices shown (1 of 2)]
[im 1/15]
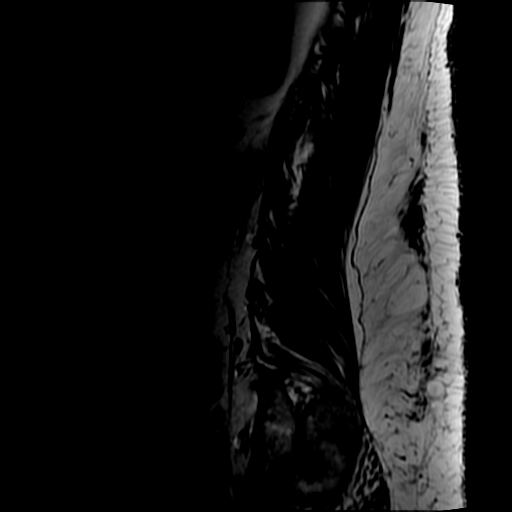
[im 10/15]
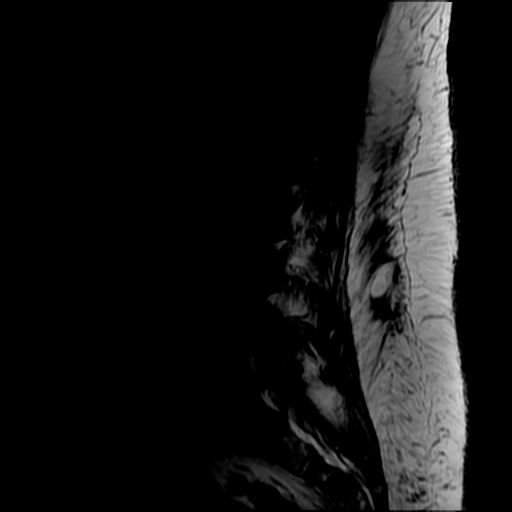
[im 15/15]
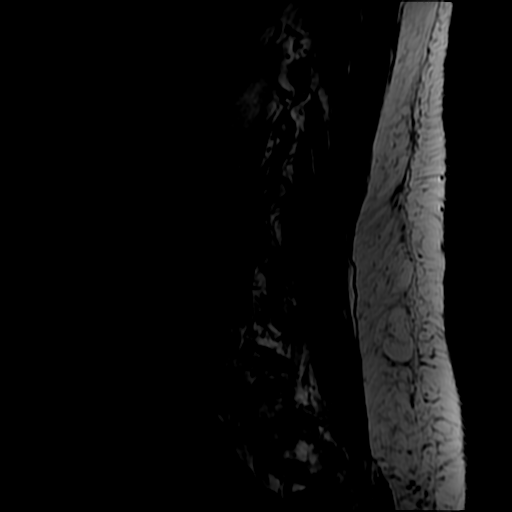

[Series 9: T2 · axial · 4.0mm · 0.39mm/px · z∈[+98,+308]mm · 7 of 48 slices shown (2 of 2)]
[im 1/48]
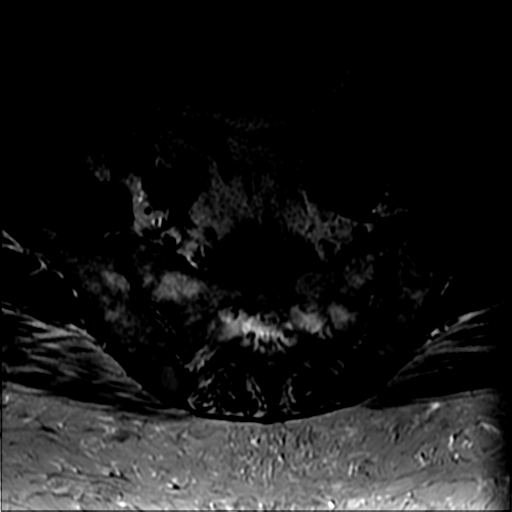
[im 5/48]
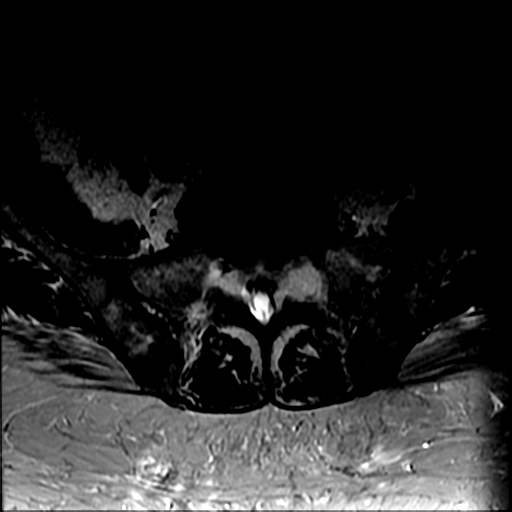
[im 10/48]
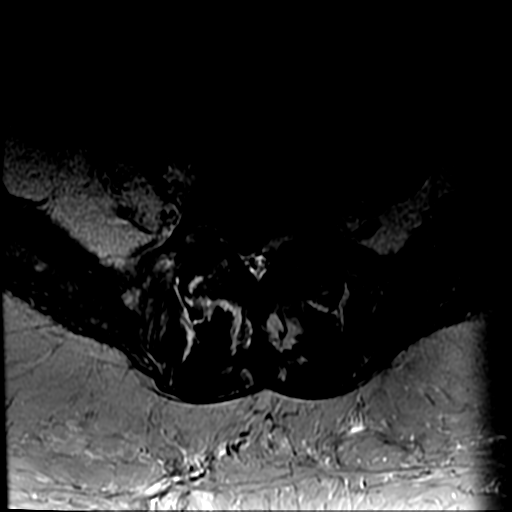
[im 15/48]
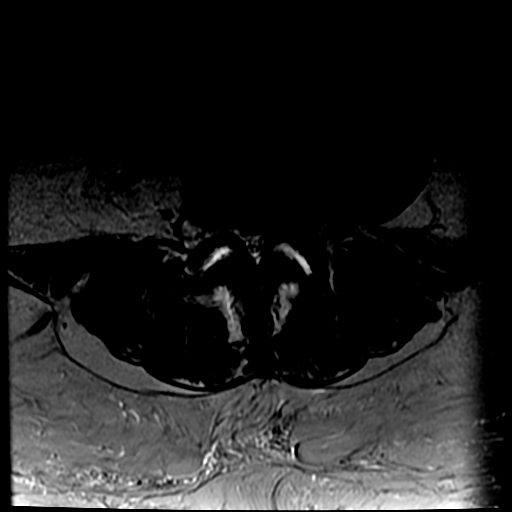
[im 19/48]
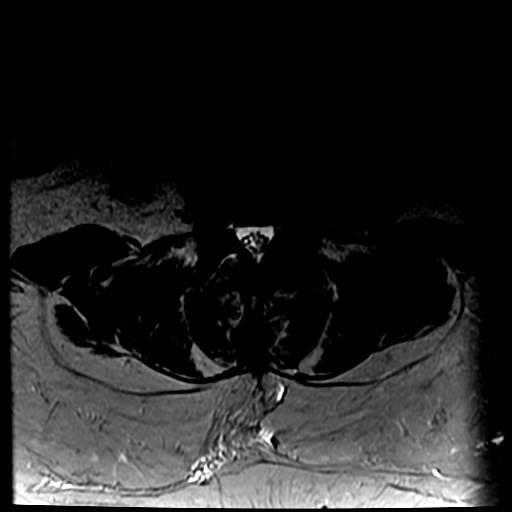
[im 24/48]
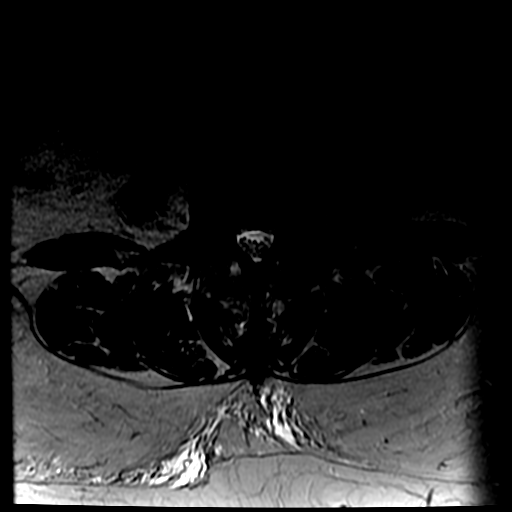
[im 43/48]
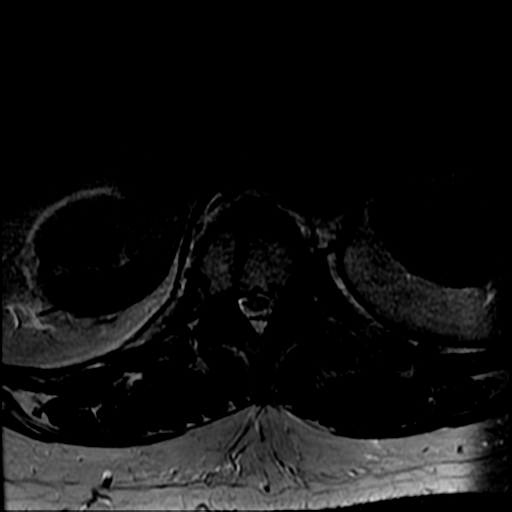

[Series 10: T1 · axial · 4.0mm · 0.39mm/px · z∈[+118,+308]mm · 3 of 48 slices shown (2 of 2)]
[im 5/48]
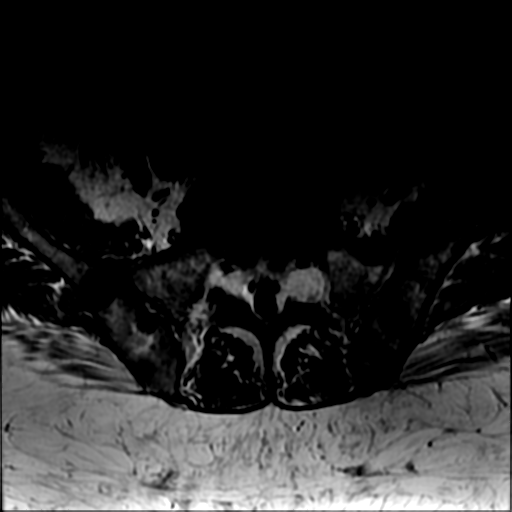
[im 24/48]
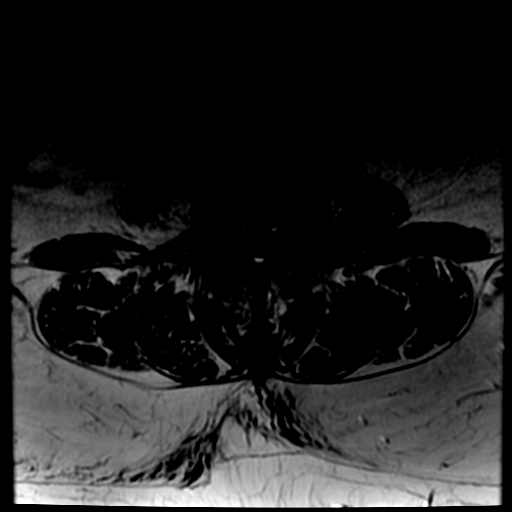
[im 43/48]
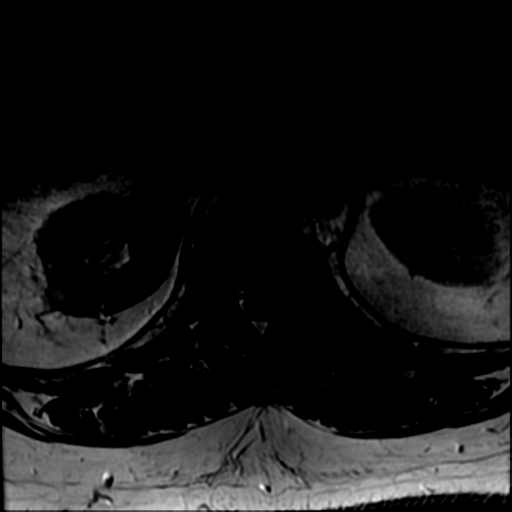

[17 of 48 positions shown; findings below may reference images not displayed]

FINDINGS: SEGMENTATION: For the purposes of this report, the last well-formed
intervertebral disc will be described as L5-S1.

ALIGNMENT: Maintenance of the lumbar lordosis. No malalignment.

VERTEBRAE:Diffuse osseous metastasis within all included axial
skeleton. Tumoral osseous expansion of multiple included thoracic
and, lumbar levels. Mild less than 20% T12 and L2 pathologic
fractures with generalized low T1 and bright STIR signal favoring
acute injury. Disc morphology generally maintained with mild
desiccation. Acute L4 inferior endplate Schmorl's node with
associated disc edema. Additional acute to old Schmorl's nodes.

CONUS MEDULLARIS: Conus medullaris terminates at L2 and appears
normal. No suspicious cord or leptomeningeal enhancement. Central
displacement of the cauda equina due to canal stenosis.

PARASPINAL AND SOFT TISSUES: Included view of the pelvis
demonstrates at least 5.8 cm base of bladder mass, possibly
prostatic. Mild paraspinal muscle strain without suspicious
enhancement. Asymmetrically atrophic RIGHT psoas muscle.

DISC LEVELS:

T11-12: Slight tumoral expansion LEFT middle and posterior column.
No disc bulge or canal stenosis. Mild facet arthropathy and mild
neural foraminal narrowing.

T12-L1: 3 mm tumoral expansion posterior T12 vertebral body. Tumoral
expansion T12 spinous process and LEFT lamina invading the adjacent
paraspinal soft tissues. Mild facet arthropathy and ligamentum
flavum redundancy. Mild canal stenosis at L1, no disc bulge or
neural foraminal narrowing.

L1-2: No disc bulge, canal stenosis. Mild tumoral expansion LEFT L2
posterior elements resulting in mild LEFT neural foraminal
narrowing.

L2-3: Tumoral expansion L1 spinous process. Tumoral expansion LEFT
L3 superior articular facet. No canal stenosis. Moderate LEFT neural
foraminal narrowing.

L3-4: Mild to moderate facet arthropathy and ligamentum flavum
redundancy with trace facet effusions, likely reactive. 8 mm RIGHT
facet synovial cyst extending to the paraspinal soft tissues. No
disc bulge. No canal stenosis. No neural foraminal narrowing.

L4-5: Small broad-based disc bulge. Severe facet arthropathy and
ligamentum flavum redundancy. Bilateral facet effusions, facets are
widened to 4 mm. 9 mm enhancing 13 mm synovial cyst extending to the
paraspinal soft tissues. Severe canal stenosis. Mild to moderate
RIGHT, moderate LEFT neural foraminal narrowing.

L5-S1: 6 mm tumoral expansion RIGHT posterior L5 vertebral body
effacing the RIGHT lateral recess, displacing the traversing RIGHT
L5 nerve. Small L5-S1 broad-based disc bulge. Severe RIGHT and
moderate LEFT facet arthropathy ligamentum flavum redundancy with
trace RIGHT facet effusion which is likely reactive. Moderate canal
stenosis at L5, no canal stenosis at L5-S1. Moderate to severe
bilateral neural foraminal narrowing.
IMPRESSION: 1. Diffuse osseous metastasis. Mild acute T12 and L2 pathologic
fractures. Tumoral expansion of the T12 posterior elements invading
the paraspinal soft tissues.
2. Severe canal stenosis L4-5. Tumoral expansion RIGHT L5 vertebral
body displaces the traversing RIGHT L5 nerve.
3. Multilevel neural foraminal narrowing: Moderate to severe at
L5-S1.
4. Base of bladder mass suspicious for prostate invasion and tumor.

## 2018-09-05 ENCOUNTER — Encounter (HOSPITAL_COMMUNITY): Payer: Self-pay

## 2018-09-05 ENCOUNTER — Other Ambulatory Visit: Payer: Self-pay

## 2018-09-05 ENCOUNTER — Emergency Department (HOSPITAL_COMMUNITY): Payer: Medicare PPO

## 2018-09-05 ENCOUNTER — Emergency Department (HOSPITAL_COMMUNITY)
Admission: EM | Admit: 2018-09-05 | Discharge: 2018-09-05 | Disposition: A | Discharge status: Home / Self Care | Payer: Medicare PPO | Attending: Emergency Medicine | Admitting: Emergency Medicine

## 2018-09-05 DIAGNOSIS — Z8546 Personal history of malignant neoplasm of prostate: Secondary | ICD-10-CM | POA: Diagnosis not present

## 2018-09-05 DIAGNOSIS — R1310 Dysphagia, unspecified: Secondary | ICD-10-CM | POA: Diagnosis not present

## 2018-09-05 DIAGNOSIS — Z85830 Personal history of malignant neoplasm of bone: Secondary | ICD-10-CM | POA: Diagnosis not present

## 2018-09-05 LAB — CBC WITH DIFFERENTIAL/PLATELET
Abs Immature Granulocytes: 0.1 10*3/uL — ABNORMAL HIGH (ref 0.00–0.07)
BASOS PCT: 1 %
Basophils Absolute: 0.1 10*3/uL (ref 0.0–0.1)
EOS ABS: 0.1 10*3/uL (ref 0.0–0.5)
Eosinophils Relative: 1 %
HCT: 27.8 % — ABNORMAL LOW (ref 39.0–52.0)
Hemoglobin: 9.3 g/dL — ABNORMAL LOW (ref 13.0–17.0)
LYMPHS ABS: 1.2 10*3/uL (ref 0.7–4.0)
Lymphocytes Relative: 13 %
MCH: 21.5 pg — AB (ref 26.0–34.0)
MCHC: 33.5 g/dL (ref 30.0–36.0)
MCV: 64.2 fL — AB (ref 80.0–100.0)
Metamyelocytes Relative: 1 %
Monocytes Absolute: 0.5 10*3/uL (ref 0.1–1.0)
Monocytes Relative: 6 %
NEUTROS PCT: 78 %
NRBC: 1 % — AB (ref 0.0–0.2)
NRBC: 6 /100{WBCs} — AB
Neutro Abs: 6.9 10*3/uL (ref 1.7–7.7)
PLATELETS: 263 10*3/uL (ref 150–400)
RBC: 4.33 MIL/uL (ref 4.22–5.81)
RDW: 22.9 % — AB (ref 11.5–15.5)
WBC: 8.9 10*3/uL (ref 4.0–10.5)

## 2018-09-05 LAB — COMPREHENSIVE METABOLIC PANEL
ALK PHOS: 450 U/L — AB (ref 38–126)
ALT: 41 U/L (ref 0–44)
AST: 90 U/L — ABNORMAL HIGH (ref 15–41)
Albumin: 1.9 g/dL — ABNORMAL LOW (ref 3.5–5.0)
Anion gap: 12 (ref 5–15)
BUN: 18 mg/dL (ref 8–23)
CALCIUM: 8.8 mg/dL — AB (ref 8.9–10.3)
CO2: 26 mmol/L (ref 22–32)
Chloride: 98 mmol/L (ref 98–111)
Creatinine, Ser: 1.12 mg/dL (ref 0.61–1.24)
GFR calc non Af Amer: >60 mL/min (ref >60)
Glucose, Bld: 110 mg/dL — ABNORMAL HIGH (ref 70–99)
POTASSIUM: 3.7 mmol/L (ref 3.5–5.1)
SODIUM: 136 mmol/L (ref 135–145)
Total Bilirubin: 6.8 mg/dL — ABNORMAL HIGH (ref 0.3–1.2)
Total Protein: 7.4 g/dL (ref 6.5–8.1)

## 2018-09-05 NOTE — Discharge Instructions (Signed)
Your bilirubin is elevated also.  This can be followed up with gastroenterology.

## 2018-09-05 NOTE — ED Provider Notes (Signed)
[facility] EMERGENCY DEPARTMENT Provider Note   CSN: [id number] Arrival date & time: 09/05/18  1250     History   Chief Complaint Chief Complaint  Patient presents with  . Dysphagia    HPI Gregory Chaney is a 77 y.o. male.  HPI Patient with difficulty eating.  It is been over the last months.  He states that he feels food get caught in his upper chest or throat.  States he can drink soup but anything else feels like it gets stuck.  Has been going for months.  States that he has lost a lot of weight also.  No chest pain.  No trouble breathing.  History of prostate cancer.  Has had radiation previously but does not sound as if it was up in his throat area.  No blood in the stool or black stools.  States he is lost at least 200 pounds but he cannot really quantify over what time. Past Medical History:  Diagnosis Date  . History of radiation therapy 11/01/16- 11/12/16   Lower spine L3- S4/ 30 Gy in 10 fractions  . Obesity   . Prostate cancer [facility]) 1995    Patient Active Problem List   Diagnosis Date Noted  . 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 Bone metastases ([place]) 10/26/2016  . Prostate CA ([place]) 04/23/2014  . Obesity 04/23/2014  . Prostate cancer metastatic to bone ([place]) 04/23/2014  . Hematuria 04/23/2014    Past Surgical History:  Procedure Laterality Date  . CYSTOSCOPY  06/13/2014   cystoscopy with transrectal ultrasound biopsy of prostate        Home Medications    Prior to Admission medications   Medication Sig Start Date End Date Taking? Authorizing Provider  traMADol (ULTRAM) 50 MG tablet Take 1 tablet (50 mg total) by mouth every 6 (six) hours as needed. 04/05/17   Wyatt Portela, MD    Family History No family history on file.  Social History Social History   Tobacco Use  . Smoking status: Never Smoker  . Smokeless tobacco: Never Used  Substance Use Topics  . Alcohol use: No   . Drug use: No     Allergies   Cephalexin; Hydrocodone; Morphine; Flomax [tamsulosin hcl]; and Percocet [oxycodone-acetaminophen]   Review of Systems Review of Systems  Constitutional: Positive for appetite change and unexpected weight change. Negative for fever.  HENT: Negative for congestion.   Respiratory: Negative for shortness of breath.   Cardiovascular: Negative for chest pain.  Gastrointestinal: Negative for abdominal pain.  Genitourinary: Negative for flank pain.  Musculoskeletal: Negative for back pain.  Skin: Negative for rash.  Neurological: Negative for weakness.  Psychiatric/Behavioral: Negative for confusion.     Physical Exam Updated Vital Signs BP 106/74   Pulse 86   Temp 98.3 F (36.8 C) (Oral)   Resp 18   Ht _0  (1.753 m)   Wt 92.2 kg   SpO2 100%   BMI 30.02 kg/m   Physical Exam HENT:     Head: Atraumatic.  Eyes:     Pupils: Pupils are equal, round, and reactive to light.  Neck:     Musculoskeletal: Normal range of motion.  Cardiovascular:     Comments: Mild tachycardia Pulmonary:     Effort: Pulmonary effort is normal.  Abdominal:     Palpations: Abdomen is soft.     Tenderness: There is no abdominal tenderness.  Musculoskeletal:  General: No deformity.  Skin:    General: Skin is warm.     Capillary Refill: Capillary refill takes less than 2 seconds.  Neurological:     General: No focal deficit present.     Mental Status: He is alert.  Psychiatric:        Mood and Affect: Mood normal.      ED Treatments / Results  Labs (all labs ordered are listed, but only abnormal results are displayed) Labs Reviewed  COMPREHENSIVE METABOLIC PANEL - Abnormal; Notable for the following components:      Result Value   Glucose, Bld 110 (*)    Calcium 8.8 (*)    Albumin 1.9 (*)    AST 90 (*)    Alkaline Phosphatase 450 (*)    Total Bilirubin 6.8 (*)    All other components within normal limits  CBC WITH DIFFERENTIAL/PLATELET -  Abnormal; Notable for the following components:   Hemoglobin 9.3 (*)    HCT 27.8 (*)    MCV 64.2 (*)    MCH 21.5 (*)    RDW 22.9 (*)    nRBC 1.0 (*)    nRBC 6 (*)    Abs Immature Granulocytes 0.10 (*)    All other components within normal limits    EKG None  Radiology Dg Chest 2 View  Result Date: 09/05/2018 CLINICAL DATA:  Dysphagia. EXAM: CHEST - 2 VIEW COMPARISON:  March 09, 2017 FINDINGS: The heart size and mediastinal contours are within normal limits. Both lungs are clear. The visualized skeletal structures are unremarkable. IMPRESSION: No active cardiopulmonary disease. Electronically Signed   By: Dorise Bullion III M.D   On: 09/05/2018 16:56    Procedures Procedures (including critical care time)  Medications Ordered in ED Medications - No data to display   Initial Impression / Assessment and Plan / ED Course  I have reviewed the triage vital signs and the nursing notes.  Pertinent labs & imaging results that were available during my care of the patient were reviewed by me and considered in my medical decision making (see chart for details).     Patient with dysphasia and weight loss.  I have arranged follow-up with [facility] for later this week.  Cover bilirubin and alk phos if increased.  Has a history of prostate cancer but patient states he no longer sees oncology for it.  His weight has gone from 111 kg a year ago down to 92 kg now.  I think his prostate malignancy is likely worsened.  Final Clinical Impressions(s) / ED Diagnoses   Final diagnoses:  Dysphagia, unspecified type    ED Discharge Orders    None       Davonna Belling, MD 09/05/18 1737

## 2018-09-05 NOTE — ED Notes (Signed)
Patient transported to X-ray 

## 2018-09-05 NOTE — ED Notes (Signed)
ED Provider at bedside. 

## 2018-09-05 NOTE — ED Triage Notes (Signed)
Pt states " I cant eat" Denies nausea or vomiting but states he can only eat soup, when he tries to eat solid food "It wont go down"

## 2018-09-08 ENCOUNTER — Encounter: Payer: Self-pay | Admitting: Physician Assistant

## 2018-09-08 ENCOUNTER — Other Ambulatory Visit (INDEPENDENT_AMBULATORY_CARE_PROVIDER_SITE_OTHER): Payer: Medicare PPO

## 2018-09-08 ENCOUNTER — Telehealth: Payer: Self-pay | Admitting: *Deleted

## 2018-09-08 ENCOUNTER — Ambulatory Visit (INDEPENDENT_AMBULATORY_CARE_PROVIDER_SITE_OTHER): Payer: Medicare PPO | Admitting: Physician Assistant

## 2018-09-08 VITALS — BP 88/62 | HR 84 | Ht 69.0 in

## 2018-09-08 DIAGNOSIS — C61 Malignant neoplasm of prostate: Secondary | ICD-10-CM

## 2018-09-08 DIAGNOSIS — R1084 Generalized abdominal pain: Secondary | ICD-10-CM

## 2018-09-08 DIAGNOSIS — R634 Abnormal weight loss: Secondary | ICD-10-CM

## 2018-09-08 DIAGNOSIS — R1311 Dysphagia, oral phase: Secondary | ICD-10-CM

## 2018-09-08 LAB — COMPREHENSIVE METABOLIC PANEL
ALT: 35 U/L (ref 0–53)
AST: 78 U/L — ABNORMAL HIGH (ref 0–37)
Albumin: 2.2 g/dL — ABNORMAL LOW (ref 3.5–5.2)
Alkaline Phosphatase: 453 U/L — ABNORMAL HIGH (ref 39–117)
BUN: 18 mg/dL (ref 6–23)
CHLORIDE: 100 meq/L (ref 96–112)
CO2: 27 meq/L (ref 19–32)
Calcium: 8.4 mg/dL (ref 8.4–10.5)
Creatinine, Ser: 0.99 mg/dL (ref 0.40–1.50)
GFR: 94.37 mL/min (ref >60.00)
Glucose, Bld: 99 mg/dL (ref 70–99)
Potassium: 3.6 mEq/L (ref 3.5–5.1)
Sodium: 137 mEq/L (ref 135–145)
Total Bilirubin: 6.7 mg/dL — ABNORMAL HIGH (ref 0.2–1.2)
Total Protein: 6.7 g/dL (ref 6.0–8.3)

## 2018-09-08 LAB — CBC WITH DIFFERENTIAL/PLATELET
Basophils Relative: 1 % (ref 0.0–3.0)
Eosinophils Relative: 1 % (ref 0.0–5.0)
HCT: 30 % — ABNORMAL LOW (ref 39.0–52.0)
Hemoglobin: 9.6 g/dL — ABNORMAL LOW (ref 13.0–17.0)
Lymphocytes Relative: 40 % (ref 12.0–46.0)
MCHC: 32.2 g/dL (ref 30.0–36.0)
MCV: 69.2 fl — ABNORMAL LOW (ref 78.0–100.0)
Monocytes Relative: 14 % — ABNORMAL HIGH (ref 3.0–12.0)
Neutrophils Relative %: 44 % (ref 43.0–77.0)
Platelets: 302 10*3/uL (ref 150.0–400.0)
RBC: 4.33 Mil/uL (ref 4.22–5.81)
RDW: 20.2 % — ABNORMAL HIGH (ref 11.5–15.5)
WBC: 9.1 10*3/uL (ref 4.0–10.5)

## 2018-09-08 LAB — LIPASE: Lipase: 2 U/L — ABNORMAL LOW (ref 11.0–59.0)

## 2018-09-08 MED ORDER — PANTOPRAZOLE SODIUM 40 MG PO TBEC: DELAYED_RELEASE_TABLET | ORAL | 3 refills | Status: DC

## 2018-09-08 NOTE — Progress Notes (Signed)
Reviewed. I agree with documentation including the assessment and plan.  Noami Bove L. Jackqueline Aquilar, MD, MPH 

## 2018-09-08 NOTE — Progress Notes (Signed)
Chief Complaint: Dysphagia  HPI:    Gregory Chaney is a 77 year old male with a past medical history of prostate cancer and others listed below, who was referred to me by [name], NP for follow-up after an ER visit for dysphagia.     05/10/2017 office visit with hematology/oncology, it appears patient was diagnosed with prostate cancer he did not receive any definitive therapy at that time and had not had any further urological work-up.  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  Did receive radiation therapy to lower spine between 11/01/2016-11/12/2016.  At that time, he was continued on Lupron 30 mg every 4 months, but he never followed with them.    09/05/2018 ED visit describing several months of difficulty with eating describing that food got caught in his upper chest or throat.  Patient described weight loss associated with this due to altered diet.  Labs at that time with a CMP showing AST elevated at 90, alk phos elevated at 450, total bilirubin elevated at 6.8.  CBC with a hemoglobin of 9.3 and otherwise normal.  Chest x-ray with no cardiopulmonary disease.  It is stated the patient's weight had gone from 111 kg a year ago to 92 kg now.     Today, the patient presents to clinic accompanied by his neighbor, who does assist with his history.  Patient explains that he was seeing oncology for prostate cancer but just stopped seeing them and did not have his Lupron as recommended.  He tells me that he was hardheaded and should have continue to follow with them.  Now he is just feeling ill.  He describes weight loss and a general abdominal discomfort.  Tells me he just "does not feel like himself".  Most notably the patient is having trouble swallowing anything solid.  Patient tells me that he actually cannot swallow meats.  He will chew and chew them but when he goes to swallow it is like "my body is saying no".  He denies anything actually getting stuck in his throat.  He is  able to swallow liquids and has been maintaining on soup.  Also describes a burning up and down his esophagus.    Denies fever, chills, nausea, vomiting, change in bowel habits or symptoms that awaken him from sleep.    Past Medical History:  Diagnosis Date  . History of radiation therapy 11/01/16- 11/12/16   Lower spine L3- S4/ 30 Gy in 10 fractions  . Obesity   . Prostate cancer ([place]) 1995    Past Surgical History:  Procedure Laterality Date  . CYSTOSCOPY  06/13/2014   cystoscopy with transrectal ultrasound biopsy of prostate    No current outpatient medications on file.   No current facility-administered medications for this visit.     Allergies as of 09/08/2018 - Review Complete 09/08/2018  Allergen Reaction Noted  . Cephalexin Other (See Comments) 07/22/2016  . Hydrocodone Other (See Comments) 06/12/2014  . Morphine Other (See Comments) 06/12/2014  . Flomax [tamsulosin hcl] Other (See Comments) 07/22/2016  . Percocet [oxycodone-acetaminophen] Nausea Only 07/22/2016    Family History  Problem Relation Age of Onset  . Colon cancer Neg Hx     Social History   Socioeconomic History  . Marital status: Married    Spouse name: Not on file  . Number of children: Not on file  . Years of education: Not on file  . Highest education level: Not on file  Occupational History  . Not  on file  Social Needs  . Financial resource strain: Not on file  . Food insecurity:    Worry: Not on file    Inability: Not on file  . Transportation needs:    Medical: Not on file    Non-medical: Not on file  Tobacco Use  . Smoking status: Never Smoker  . Smokeless tobacco: Never Used  Substance and Sexual Activity  . Alcohol use: No  . Drug use: No  . Sexual activity: Not on file  Lifestyle  . Physical activity:    Days per week: Not on file    Minutes per session: Not on file  . Stress: Not on file  Relationships  . Social connections:    Talks on phone: Not on file    Gets  together: Not on file    Attends religious service: Not on file    Active member of club or organization: Not on file    Attends meetings of clubs or organizations: Not on file    Relationship status: Not on file  . Intimate partner violence:    Fear of current or ex partner: Not on file    Emotionally abused: Not on file    Physically abused: Not on file    Forced sexual activity: Not on file  Other Topics Concern  . Not on file  Social History Narrative  . Not on file    Review of Systems:    Constitutional: No fever or chills Skin: No rash  Cardiovascular: No chest pain Respiratory: No SOB  Gastrointestinal: See HPI and otherwise negative Genitourinary: No dysuria Neurological: No headache Musculoskeletal: No new muscle or joint pain Hematologic: No bleeding  Psychiatric: No history of depression or anxiety   Physical Exam:  Vital signs: BP (!) 88/62   Pulse 84   Ht _0  (1.753 m)   BMI 30.02 kg/m   Constitutional:  AA male appears to be in NAD, Well developed, Well nourished, alert and cooperative Head:  Normocephalic and atraumatic. Eyes:   PEERL, EOMI. No icterus. Conjunctiva pink. Ears:  +HOH Neck:  Supple Throat: Oral cavity and pharynx without inflammation, swelling or lesion.  Respiratory: Respirations even and unlabored. Lungs clear to auscultation bilaterally.   No wheezes, crackles, or rhonchi.  Cardiovascular: Normal S1, S2. No MRG. Regular rate and rhythm. No peripheral edema, cyanosis or pallor.  Gastrointestinal:  Soft, nondistended, mild generalized ttp, No rebound or guarding. Normal bowel sounds. No appreciable masses or hepatomegaly. Rectal:  Not performed.  Msk:  Symmetrical without gross deformities. Without edema, no deformity or joint abnormality. +ambulates in wheelchair Neurologic:  Alert and  oriented x4;  grossly normal neurologically.  Skin:   Dry and intact without significant lesions or rashes. Psychiatric:  Demonstrates good judgement  and reason without abnormal affect or behaviors.  RELEVANT LABS AND IMAGING: CBC    Component Value Date/Time   WBC 8.9 09/05/2018 1610   RBC 4.33 09/05/2018 1610   HGB 9.3 (L) 09/05/2018 1610   HGB 9.0 (L) 05/10/2017 1138   HCT 27.8 (L) 09/05/2018 1610   HCT 28.1 (L) 05/10/2017 1138   PLT 263 09/05/2018 1610   PLT 223 05/10/2017 1138   MCV 64.2 (L) 09/05/2018 1610   MCV 69.6 (L) 05/10/2017 1138   MCH 21.5 (L) 09/05/2018 1610   MCHC 33.5 09/05/2018 1610   RDW 22.9 (H) 09/05/2018 1610   RDW 20.7 (H) 05/10/2017 1138   LYMPHSABS 1.2 09/05/2018 1610   LYMPHSABS 1.6 05/10/2017 1138  MONOABS 0.5 09/05/2018 1610   MONOABS 0.5 05/10/2017 1138   EOSABS 0.1 09/05/2018 1610   EOSABS 0.1 05/10/2017 1138   BASOSABS 0.1 09/05/2018 1610   BASOSABS 0.0 05/10/2017 1138    CMP     Component Value Date/Time   NA 136 09/05/2018 1610   NA 142 05/10/2017 1138   K 3.7 09/05/2018 1610   K 3.5 05/10/2017 1138   CL 98 09/05/2018 1610   CO2 26 09/05/2018 1610   CO2 28 05/10/2017 1138   GLUCOSE 110 (H) 09/05/2018 1610   GLUCOSE 104 05/10/2017 1138   BUN 18 09/05/2018 1610   BUN 6.9 (L) 05/10/2017 1138   CREATININE 1.12 09/05/2018 1610   CREATININE 0.8 05/10/2017 1138   CALCIUM 8.8 (L) 09/05/2018 1610   CALCIUM 8.6 05/10/2017 1138   PROT 7.4 09/05/2018 1610   PROT 5.4 (L) 05/10/2017 1138   ALBUMIN 1.9 (L) 09/05/2018 1610   ALBUMIN 2.6 (L) 05/10/2017 1138   AST 90 (H) 09/05/2018 1610   AST 12 05/10/2017 1138   ALT 41 09/05/2018 1610   ALT <6 05/10/2017 1138   ALKPHOS 450 (H) 09/05/2018 1610   ALKPHOS 738 (H) 05/10/2017 1138   BILITOT 6.8 (H) 09/05/2018 1610   BILITOT 0.58 05/10/2017 1138   GFRNONAA >60 09/05/2018 1610   GFRAA >60 09/05/2018 1610    Assessment: 1.  Dysphagia: Trouble actually swallowing meat/solids, it does not get stuck in his throat, just will not go down 2.  Weight loss: 111--> 92 kg in the past year; likely related to decreased intake as well as cancer 3.  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  Elevated LFTs: As above bilirubin 6.8 on 09/05/2017, patent with no acute right upper quadrant pain, just a mild generalized abdominal pain, I fear this may represent metastatic disease from prostate cancer  Plan: 1.  Discussed with patient that I fear his prostate cancer has worsened and the mets have spread which is now causing alterations in his liver enzymes and other symptoms.  I would recommend that he follow with his oncologist again.  Referred him back to Dr. Alen Blew. 2.  Ordered imaging to include a CT abdomen /pelvis with contrast as well as a barium swallow with tablet 3.  Ordered repeat labs including a CBC, CMP and lipase today. 4.  Patient to follow in clinic per recommendations from imaging and labs above with me.  Was assigned to Dr. Tarri Glenn today. 5.  Also prescribed Pantoprazole 40 mg daily, 30-60 minutes before eating. 6.  Recommend the patient supplement his diet with Boost or Ensure shakes 3 times daily  Ellouise Newer, PA-C [facility] 09/08/2018, 3:25 PM  Cc: [name], NP

## 2018-09-08 NOTE — Telephone Encounter (Signed)
Called Gregory Chaney at Oncology, [phone]. Left message for her to please call me Thursday 09-14-2018 if she is in the office on that date. We want to re-establish the patient with Dr. Alen Blew at the [facility] cancer center.

## 2018-09-08 NOTE — Patient Instructions (Addendum)
Pantoprazole sodium 40 mg prescription sent to the pharmacy. [facility] [address],  Drink Boost and Ensure 3 times a day.   You have been scheduled for a CT scan of the abdomen and pelvis at [facility] Radiology department, first floor.   You are scheduled on 09-19-2018 at 10:30 am. You should arrive at 10:15 am to your appointment time for registration. Please follow the written instructions below on the day of your exam:  WARNING: IF YOU ARE ALLERGIC TO IODINE/X-RAY DYE, PLEASE NOTIFY RADIOLOGY IMMEDIATELY AT [phone]! YOU WILL BE GIVEN A 13 HOUR PREMEDICATION PREP.  1) Do not eat  anything after 6:30 am (4 hours prior to your test) 2) You have been given 2 bottles of oral contrast to drink. The solution may taste better if refrigerated, but do NOT add ice or any other liquid to this solution. Shake well before drinking.    Drink 1 bottle of contrast @ 8:30 am (2 hours prior to your exam)  Drink 1 bottle of contrast @ 9:30 am (1 hour prior to your exam)  You may take any medications as prescribed with a small amount of water, if necessary. If you take any of the following medications: METFORMIN, GLUCOPHAGE, GLUCOVANCE, AVANDAMET, RIOMET, FORTAMET, [place] MET, JANUMET, GLUMETZA or METAGLIP, you MAY be asked to HOLD this medication 48 hours AFTER the exam.  The purpose of you drinking the oral contrast is to aid in the visualization of your intestinal tract. The contrast solution may cause some diarrhea. Depending on your individual set of symptoms, you may also receive an intravenous injection of x-ray contrast/dye. Plan on being at [facility] for 30 minutes or longer, depending on the type of exam you are having performed.  This test typically takes 30-45 minutes to complete.  You have been scheduled for a Barium Esophogram at [facility] Radiology (1st floor of the hospital) on Thursday 09-28-2018  at 9:30 am. Please arrive at 9:15 am to your appointment for  registration. Make certain not to have anything to eat or drink 3 hours prior to your test. If you need to reschedule for any reason, please contact radiology at [phone] to do so. __________________________________________________________________ A barium swallow is an examination that concentrates on views of the esophagus. This tends to be a double contrast exam (barium and two liquids which, when combined, create a gas to distend the wall of the oesophagus) or single contrast (non-ionic iodine based). The study is usually tailored to your symptoms so a good history is essential. Attention is paid during the study to the form, structure and configuration of the esophagus, looking for functional disorders (such as aspiration, dysphagia, achalasia, motility and reflux) EXAMINATION You may be asked to change into a gown, depending on the type of swallow being performed. A radiologist and radiographer will perform the procedure. The radiologist will advise you of the type of contrast selected for your procedure and direct you during the exam. You will be asked to stand, sit or lie in several different positions and to hold a small amount of fluid in your mouth before being asked to swallow while the imaging is performed .In some instances you may be asked to swallow barium coated marshmallows to assess the motility of a solid food bolus. The exam can be recorded as a digital or video fluoroscopy procedure. POST PROCEDURE It will take 1-2 days for the barium to pass through your system. To facilitate this, it is important, unless otherwise directed, to  increase your fluids for the next 24-48hrs and to resume your normal diet.  This test typically takes about 30 minutes to perform. __________________________________________________________________________________  ________________________________________________________________________

## 2018-09-15 ENCOUNTER — Other Ambulatory Visit: Payer: Self-pay | Admitting: Oncology

## 2018-09-15 ENCOUNTER — Telehealth: Payer: Self-pay | Admitting: Oncology

## 2018-09-15 DIAGNOSIS — C7951 Secondary malignant neoplasm of bone: Principal | ICD-10-CM

## 2018-09-15 DIAGNOSIS — C61 Malignant neoplasm of prostate: Secondary | ICD-10-CM

## 2018-09-15 NOTE — Telephone Encounter (Signed)
I apologize, I forgot to put Andrea's phone number [phone].

## 2018-09-15 NOTE — Telephone Encounter (Signed)
Scheduled appt per 12/27 sch message - unable to reach patient- left message with appt date and time

## 2018-09-15 NOTE — Telephone Encounter (Signed)
Spoke with Seth Bake, she was not sure who called, she connected me to the nurse line which went to voicemail. Returned message to nurse to call office, left detailed message about Mr. Holzheimer

## 2018-09-15 NOTE — Telephone Encounter (Signed)
Seth Bake at Dr. Hazeline Junker office returned Pam's call regarding re-establish with Dr. Alen Blew. Pls call her.

## 2018-09-16 ENCOUNTER — Emergency Department (HOSPITAL_COMMUNITY): Payer: Medicare PPO

## 2018-09-16 ENCOUNTER — Encounter (HOSPITAL_COMMUNITY): Payer: Self-pay

## 2018-09-16 ENCOUNTER — Inpatient Hospital Stay (HOSPITAL_COMMUNITY)
Admission: EM | Admit: 2018-09-16 | Discharge: 2018-09-27 | DRG: 871 | Disposition: A | Discharge status: Hospice | Payer: Medicare PPO | Attending: Internal Medicine | Admitting: Internal Medicine

## 2018-09-16 ENCOUNTER — Other Ambulatory Visit: Payer: Self-pay

## 2018-09-16 DIAGNOSIS — R634 Abnormal weight loss: Secondary | ICD-10-CM | POA: Diagnosis present

## 2018-09-16 DIAGNOSIS — C7911 Secondary malignant neoplasm of bladder: Secondary | ICD-10-CM | POA: Diagnosis present

## 2018-09-16 DIAGNOSIS — R17 Unspecified jaundice: Secondary | ICD-10-CM

## 2018-09-16 DIAGNOSIS — K8681 Exocrine pancreatic insufficiency: Secondary | ICD-10-CM | POA: Diagnosis present

## 2018-09-16 DIAGNOSIS — K219 Gastro-esophageal reflux disease without esophagitis: Secondary | ICD-10-CM | POA: Diagnosis present

## 2018-09-16 DIAGNOSIS — R1310 Dysphagia, unspecified: Secondary | ICD-10-CM | POA: Diagnosis present

## 2018-09-16 DIAGNOSIS — A419 Sepsis, unspecified organism: Secondary | ICD-10-CM | POA: Diagnosis present

## 2018-09-16 DIAGNOSIS — R0609 Other forms of dyspnea: Secondary | ICD-10-CM | POA: Diagnosis not present

## 2018-09-16 DIAGNOSIS — Z881 Allergy status to other antibiotic agents status: Secondary | ICD-10-CM

## 2018-09-16 DIAGNOSIS — K6389 Other specified diseases of intestine: Secondary | ICD-10-CM | POA: Diagnosis not present

## 2018-09-16 DIAGNOSIS — E8809 Other disorders of plasma-protein metabolism, not elsewhere classified: Secondary | ICD-10-CM | POA: Diagnosis present

## 2018-09-16 DIAGNOSIS — Z515 Encounter for palliative care: Secondary | ICD-10-CM

## 2018-09-16 DIAGNOSIS — R7989 Other specified abnormal findings of blood chemistry: Secondary | ICD-10-CM | POA: Diagnosis present

## 2018-09-16 DIAGNOSIS — R0902 Hypoxemia: Secondary | ICD-10-CM

## 2018-09-16 DIAGNOSIS — R945 Abnormal results of liver function studies: Secondary | ICD-10-CM

## 2018-09-16 DIAGNOSIS — R55 Syncope and collapse: Secondary | ICD-10-CM | POA: Diagnosis present

## 2018-09-16 DIAGNOSIS — R6521 Severe sepsis with septic shock: Secondary | ICD-10-CM | POA: Diagnosis present

## 2018-09-16 DIAGNOSIS — H9190 Unspecified hearing loss, unspecified ear: Secondary | ICD-10-CM | POA: Diagnosis present

## 2018-09-16 DIAGNOSIS — Z66 Do not resuscitate: Secondary | ICD-10-CM | POA: Diagnosis not present

## 2018-09-16 DIAGNOSIS — Z7189 Other specified counseling: Secondary | ICD-10-CM

## 2018-09-16 DIAGNOSIS — C7951 Secondary malignant neoplasm of bone: Secondary | ICD-10-CM | POA: Diagnosis present

## 2018-09-16 DIAGNOSIS — Z923 Personal history of irradiation: Secondary | ICD-10-CM | POA: Diagnosis not present

## 2018-09-16 DIAGNOSIS — Z9119 Patient's noncompliance with other medical treatment and regimen: Secondary | ICD-10-CM

## 2018-09-16 DIAGNOSIS — Z9181 History of falling: Secondary | ICD-10-CM

## 2018-09-16 DIAGNOSIS — C787 Secondary malignant neoplasm of liver and intrahepatic bile duct: Secondary | ICD-10-CM | POA: Diagnosis present

## 2018-09-16 DIAGNOSIS — K7200 Acute and subacute hepatic failure without coma: Secondary | ICD-10-CM | POA: Diagnosis present

## 2018-09-16 DIAGNOSIS — R160 Hepatomegaly, not elsewhere classified: Secondary | ICD-10-CM | POA: Diagnosis not present

## 2018-09-16 DIAGNOSIS — G9341 Metabolic encephalopathy: Secondary | ICD-10-CM | POA: Diagnosis present

## 2018-09-16 DIAGNOSIS — C7889 Secondary malignant neoplasm of other digestive organs: Secondary | ICD-10-CM | POA: Diagnosis present

## 2018-09-16 DIAGNOSIS — C61 Malignant neoplasm of prostate: Secondary | ICD-10-CM | POA: Diagnosis present

## 2018-09-16 DIAGNOSIS — Z888 Allergy status to other drugs, medicaments and biological substances status: Secondary | ICD-10-CM

## 2018-09-16 DIAGNOSIS — E860 Dehydration: Secondary | ICD-10-CM | POA: Diagnosis present

## 2018-09-16 DIAGNOSIS — R52 Pain, unspecified: Secondary | ICD-10-CM

## 2018-09-16 DIAGNOSIS — Z9229 Personal history of other drug therapy: Secondary | ICD-10-CM

## 2018-09-16 DIAGNOSIS — D72829 Elevated white blood cell count, unspecified: Secondary | ICD-10-CM | POA: Diagnosis not present

## 2018-09-16 DIAGNOSIS — N179 Acute kidney failure, unspecified: Secondary | ICD-10-CM | POA: Diagnosis present

## 2018-09-16 DIAGNOSIS — K8020 Calculus of gallbladder without cholecystitis without obstruction: Secondary | ICD-10-CM | POA: Diagnosis present

## 2018-09-16 DIAGNOSIS — E872 Acidosis, unspecified: Secondary | ICD-10-CM | POA: Diagnosis present

## 2018-09-16 DIAGNOSIS — D630 Anemia in neoplastic disease: Secondary | ICD-10-CM | POA: Diagnosis present

## 2018-09-16 DIAGNOSIS — C187 Malignant neoplasm of sigmoid colon: Secondary | ICD-10-CM | POA: Diagnosis present

## 2018-09-16 DIAGNOSIS — Z885 Allergy status to narcotic agent status: Secondary | ICD-10-CM

## 2018-09-16 DIAGNOSIS — R579 Shock, unspecified: Secondary | ICD-10-CM | POA: Diagnosis not present

## 2018-09-16 DIAGNOSIS — R778 Other specified abnormalities of plasma proteins: Secondary | ICD-10-CM | POA: Diagnosis present

## 2018-09-16 LAB — COMPREHENSIVE METABOLIC PANEL
ALT: 74 U/L — AB (ref 0–44)
AST: 233 U/L — ABNORMAL HIGH (ref 15–41)
Albumin: 2 g/dL — ABNORMAL LOW (ref 3.5–5.0)
Alkaline Phosphatase: 384 U/L — ABNORMAL HIGH (ref 38–126)
Anion gap: 20 — ABNORMAL HIGH (ref 5–15)
BUN: 41 mg/dL — ABNORMAL HIGH (ref 8–23)
CO2: 18 mmol/L — ABNORMAL LOW (ref 22–32)
Calcium: 8.7 mg/dL — ABNORMAL LOW (ref 8.9–10.3)
Chloride: 99 mmol/L (ref 98–111)
Creatinine, Ser: 2.75 mg/dL — ABNORMAL HIGH (ref 0.61–1.24)
GFR calc Af Amer: 25 mL/min — ABNORMAL LOW (ref >60)
GFR calc non Af Amer: 21 mL/min — ABNORMAL LOW (ref >60)
Glucose, Bld: 104 mg/dL — ABNORMAL HIGH (ref 70–99)
Potassium: 3.5 mmol/L (ref 3.5–5.1)
Sodium: 137 mmol/L (ref 135–145)
Total Bilirubin: 13 mg/dL — ABNORMAL HIGH (ref 0.3–1.2)
Total Protein: 7.5 g/dL (ref 6.5–8.1)

## 2018-09-16 LAB — CBC WITH DIFFERENTIAL/PLATELET
Abs Immature Granulocytes: 0.12 10*3/uL — ABNORMAL HIGH (ref 0.00–0.07)
Basophils Absolute: 0 10*3/uL (ref 0.0–0.1)
Basophils Relative: 0 %
Eosinophils Absolute: 0 10*3/uL (ref 0.0–0.5)
Eosinophils Relative: 0 %
HEMATOCRIT: 29.5 % — AB (ref 39.0–52.0)
Hemoglobin: 10 g/dL — ABNORMAL LOW (ref 13.0–17.0)
Immature Granulocytes: 1 %
LYMPHS PCT: 11 %
Lymphs Abs: 1.4 10*3/uL (ref 0.7–4.0)
MCH: 22.7 pg — ABNORMAL LOW (ref 26.0–34.0)
MCHC: 33.9 g/dL (ref 30.0–36.0)
MCV: 67 fL — ABNORMAL LOW (ref 80.0–100.0)
Monocytes Absolute: 0.9 10*3/uL (ref 0.1–1.0)
Monocytes Relative: 7 %
Neutro Abs: 10.1 10*3/uL — ABNORMAL HIGH (ref 1.7–7.7)
Neutrophils Relative %: 81 %
Platelets: 214 10*3/uL (ref 150–400)
RBC: 4.4 MIL/uL (ref 4.22–5.81)
RDW: 23.9 % — ABNORMAL HIGH (ref 11.5–15.5)
WBC: 12.5 10*3/uL — ABNORMAL HIGH (ref 4.0–10.5)
nRBC: 0.6 % — ABNORMAL HIGH (ref 0.0–0.2)

## 2018-09-16 LAB — I-STAT TROPONIN, ED: Troponin i, poc: 0.06 ng/mL (ref 0.00–0.08)

## 2018-09-16 LAB — LIPASE, BLOOD: Lipase: 40 U/L (ref 11–51)

## 2018-09-16 LAB — MRSA PCR SCREENING: MRSA by PCR: NEGATIVE

## 2018-09-16 LAB — I-STAT CG4 LACTIC ACID, ED
Lactic Acid, Venous: 7.76 mmol/L (ref 0.5–1.9)
Lactic Acid, Venous: 8.08 mmol/L (ref 0.5–1.9)

## 2018-09-16 LAB — TROPONIN I: Troponin I: 0.04 ng/mL (ref <0.03)

## 2018-09-16 LAB — PROTIME-INR
INR: 1.48
Prothrombin Time: 17.7 seconds — ABNORMAL HIGH (ref 11.4–15.2)

## 2018-09-16 LAB — CK: Total CK: 1838 U/L — ABNORMAL HIGH (ref 49–397)

## 2018-09-16 LAB — AMMONIA: AMMONIA: 44 umol/L — AB (ref 9–35)

## 2018-09-16 MED ORDER — HEPARIN SODIUM (PORCINE) 5000 UNIT/ML IJ SOLN
5000.0000 [IU] | Freq: Three times a day (TID) | INTRAMUSCULAR | Status: DC
  Administered 2018-09-16 – 2018-09-20 (×11): 5000 [IU] via SUBCUTANEOUS
  Filled 2018-09-16 (×11): qty 1

## 2018-09-16 MED ORDER — PIPERACILLIN-TAZOBACTAM 3.375 G IVPB
3.3750 g | Freq: Once | INTRAVENOUS | Status: AC
  Administered 2018-09-16: 3.375 g via INTRAVENOUS
  Filled 2018-09-16: qty 50

## 2018-09-16 MED ORDER — PIPERACILLIN-TAZOBACTAM 3.375 G IVPB
3.3750 g | Freq: Three times a day (TID) | INTRAVENOUS | Status: DC
  Administered 2018-09-16 – 2018-09-18 (×5): 3.375 g via INTRAVENOUS
  Filled 2018-09-16 (×5): qty 50

## 2018-09-16 MED ORDER — ONDANSETRON HCL 4 MG/2ML IJ SOLN: 4.0000 mg | Freq: Four times a day (QID) | INTRAMUSCULAR | Status: DC | PRN

## 2018-09-16 MED ORDER — LACTATED RINGERS IV BOLUS
750.0000 mL | Freq: Once | INTRAVENOUS | Status: AC
  Administered 2018-09-16: 750 mL via INTRAVENOUS

## 2018-09-16 MED ORDER — LACTATED RINGERS IV BOLUS
1000.0000 mL | Freq: Once | INTRAVENOUS | Status: AC
  Administered 2018-09-16: 1000 mL via INTRAVENOUS

## 2018-09-16 MED ORDER — ALBUMIN HUMAN 5 % IV SOLN
12.5000 g | Freq: Once | INTRAVENOUS | Status: AC
  Administered 2018-09-16: 12.5 g via INTRAVENOUS
  Filled 2018-09-16: qty 250

## 2018-09-16 MED ORDER — VANCOMYCIN HCL IN DEXTROSE 1-5 GM/200ML-% IV SOLN
1000.0000 mg | Freq: Once | INTRAVENOUS | Status: AC
  Administered 2018-09-16: 1000 mg via INTRAVENOUS
  Filled 2018-09-16: qty 200

## 2018-09-16 MED ORDER — PANTOPRAZOLE SODIUM 40 MG PO TBEC
40.0000 mg | DELAYED_RELEASE_TABLET | Freq: Every day | ORAL | Status: DC
  Administered 2018-09-16 – 2018-09-26 (×11): 40 mg via ORAL
  Filled 2018-09-16 (×11): qty 1

## 2018-09-16 MED ORDER — HYDROMORPHONE HCL 2 MG PO TABS
1.0000 mg | ORAL_TABLET | ORAL | Status: DC | PRN
  Administered 2018-09-17: 1 mg via ORAL
  Filled 2018-09-16 (×3): qty 1

## 2018-09-16 MED ORDER — ONDANSETRON HCL 4 MG PO TABS: 4.0000 mg | ORAL_TABLET | Freq: Four times a day (QID) | ORAL | Status: DC | PRN

## 2018-09-16 MED ORDER — SODIUM CHLORIDE 0.9% FLUSH
3.0000 mL | Freq: Two times a day (BID) | INTRAVENOUS | Status: DC
  Administered 2018-09-16 – 2018-09-22 (×5): 3 mL via INTRAVENOUS

## 2018-09-16 NOTE — ED Provider Notes (Signed)
[facility] DEPT Provider Note   CSN: [id number] Arrival date & time: 09/16/18  4403     History   Chief Complaint Chief Complaint  Patient presents with  . Prostate Cancer  . Weakness    HPI Shai Mckenzie is a 77 y.o. male.  Patient with history of prostate cancer with bone metastasis who states that he has had increasing weakness over the last several days.  He thinks that he may have passed out today.  Denies any chest pain, shortness of breath.  Patient lives at home by himself.  He has no specific complaints other than he feels generally weak.  The history is provided by the patient.  Near Syncope  This is a new problem. The current episode started 12 to 24 hours ago. The problem occurs daily. The problem has been gradually worsening. Pertinent negatives include no chest pain, no abdominal pain, no headaches and no shortness of breath. Nothing aggravates the symptoms. Nothing relieves the symptoms. He has tried nothing for the symptoms. The treatment provided no relief.    Past Medical History:  Diagnosis Date  . History of radiation therapy 11/01/16- 11/12/16   Lower spine L3- S4/ 30 Gy in 10 fractions  . Obesity   . Prostate cancer [facility]) 1995    Patient Active Problem List   Diagnosis Date Noted  . Lactic acidosis 09/16/2018  . 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 Bone metastases ([place]) 10/26/2016  . Prostate CA ([place]) 04/23/2014  . Obesity 04/23/2014  . Prostate cancer metastatic to bone ([place]) 04/23/2014  . Hematuria 04/23/2014    Past Surgical History:  Procedure Laterality Date  . CYSTOSCOPY  06/13/2014   cystoscopy with transrectal ultrasound biopsy of prostate        Home Medications    Prior to Admission medications   Medication Sig Start Date End Date Taking? Authorizing Provider  pantoprazole (PROTONIX) 40 MG tablet Take 1 tablet 30-60 min before breakfast.  09/08/18  Yes Lemmon, Lavone Nian, PA    Family History Family History  Problem Relation Age of Onset  . Colon cancer Neg Hx     Social History Social History   Tobacco Use  . Smoking status: Never Smoker  . Smokeless tobacco: Never Used  Substance Use Topics  . Alcohol use: No  . Drug use: No     Allergies   Cephalexin; Hydrocodone; Morphine; Flomax [tamsulosin hcl]; and Percocet [oxycodone-acetaminophen]   Review of Systems Review of Systems  Constitutional: Positive for activity change and appetite change. Negative for chills and fever.  HENT: Negative for ear pain and sore throat.   Eyes: Negative for pain and visual disturbance.  Respiratory: Negative for cough and shortness of breath.   Cardiovascular: Positive for near-syncope. Negative for chest pain and palpitations.  Gastrointestinal: Negative for abdominal distention, abdominal pain and vomiting.  Genitourinary: Negative for dysuria and hematuria.  Musculoskeletal: Negative for arthralgias and back pain.  Skin: Negative for color change and rash.  Neurological: Positive for syncope and weakness. Negative for seizures and headaches.  All other systems reviewed and are negative.    Physical Exam Updated Vital Signs  ED Triage Vitals [09/16/18 0941]  Enc Vitals Group     BP 91/60     Pulse Rate 91     Resp (!) 22     Temp 97.6 F (36.4 C)     Temp Source Oral     SpO2 99 %  Weight      Height      Head Circumference      Peak Flow      Pain Score 2     Pain Loc      Pain Edu?      Excl. in [place]?     Physical Exam Vitals signs and nursing note reviewed.  Constitutional:      Appearance: He is well-developed. He is ill-appearing and toxic-appearing.  HENT:     Head: Normocephalic and atraumatic.     Right Ear: Tympanic membrane normal.     Left Ear: Tympanic membrane normal.     Nose: Congestion present.     Mouth/Throat:     Mouth: Mucous membranes are dry.  Eyes:     General:  Scleral icterus present.     Extraocular Movements: Extraocular movements intact.     Conjunctiva/sclera: Conjunctivae normal.     Pupils: Pupils are equal, round, and reactive to light.  Neck:     Musculoskeletal: Normal range of motion and neck supple.  Cardiovascular:     Rate and Rhythm: Normal rate and regular rhythm.     Pulses: Normal pulses.     Heart sounds: Normal heart sounds. No murmur.  Pulmonary:     Effort: Pulmonary effort is normal. No respiratory distress.     Breath sounds: Normal breath sounds.  Abdominal:     General: There is no distension.     Palpations: Abdomen is soft.     Tenderness: There is no abdominal tenderness. There is no guarding.  Musculoskeletal: Normal range of motion.  Skin:    General: Skin is warm and dry.     Capillary Refill: Capillary refill takes less than 2 seconds.     Coloration: Skin is jaundiced.  Neurological:     General: No focal deficit present.     Mental Status: He is alert and oriented to person, place, and time.     Cranial Nerves: No cranial nerve deficit.     Sensory: No sensory deficit.     Motor: No weakness.     Coordination: Coordination normal.      ED Treatments / Results  Labs (all labs ordered are listed, but only abnormal results are displayed) Labs Reviewed  COMPREHENSIVE METABOLIC PANEL - Abnormal; Notable for the following components:      Result Value   CO2 18 (*)    Glucose, Bld 104 (*)    BUN 41 (*)    Creatinine, Ser 2.75 (*)    Calcium 8.7 (*)    Albumin 2.0 (*)    AST 233 (*)    ALT 74 (*)    Alkaline Phosphatase 384 (*)    Total Bilirubin 13.0 (*)    GFR calc non Af Amer 21 (*)    GFR calc Af Amer 25 (*)    Anion gap 20 (*)    All other components within normal limits  CBC WITH DIFFERENTIAL/PLATELET - Abnormal; Notable for the following components:   WBC 12.5 (*)    Hemoglobin 10.0 (*)    HCT 29.5 (*)    MCV 67.0 (*)    MCH 22.7 (*)    RDW 23.9 (*)    nRBC 0.6 (*)    Neutro Abs  10.1 (*)    Abs Immature Granulocytes 0.12 (*)    All other components within normal limits  AMMONIA - Abnormal; Notable for the following components:   Ammonia 44 (*)  All other components within normal limits  PROTIME-INR - Abnormal; Notable for the following components:   Prothrombin Time 17.7 (*)    All other components within normal limits  I-STAT CG4 LACTIC ACID, ED - Abnormal; Notable for the following components:   Lactic Acid, Venous 7.76 (*)    All other components within normal limits  I-STAT CG4 LACTIC ACID, ED - Abnormal; Notable for the following components:   Lactic Acid, Venous 8.08 (*)    All other components within normal limits  CULTURE, BLOOD (ROUTINE X 2)  CULTURE, BLOOD (ROUTINE X 2)  URINE CULTURE  LIPASE, BLOOD  URINALYSIS, ROUTINE W REFLEX MICROSCOPIC  I-STAT TROPONIN, ED    EKG EKG Interpretation  Date/Time:  Saturday September 16 2018 10:08:52 EST Ventricular Rate:  86 PR Interval:    QRS Duration: 130 QT Interval:  424 QTC Calculation: 508 R Axis:   -14 Text Interpretation:  Sinus rhythm Right bundle branch block Confirmed by Lennice Sites [phone]) on 09/16/2018 11:01:01 AM   Radiology Ct Abdomen Pelvis Wo Contrast  Result Date: 09/16/2018 CLINICAL DATA:  Abdominal distention. Metastatic prostate cancer. EXAM: CT ABDOMEN AND PELVIS WITHOUT CONTRAST TECHNIQUE: Multidetector CT imaging of the abdomen and pelvis was performed following the standard protocol without IV contrast. COMPARISON:  CT scan dated 03/09/2017 FINDINGS: Lower chest: On image number 1 there is an enlarged subcarinal lymph node, 18 mm in diameter. Heart size is normal. Minimal pericardial effusion. Minimal linear scarring at the lung bases. No effusions. Hepatobiliary: New extensive metastatic disease throughout the liver with new hepatomegaly due to the metastatic disease. Cholelithiasis. No dilated bile ducts. Pancreas: Unremarkable. No pancreatic ductal dilatation or surrounding  inflammatory changes. Spleen: New ill-defined 3.2 cm lesion in the posterior aspect of the spleen. Adrenals/Urinary Tract: Adrenal glands are normal. No hydronephrosis. 16 mm partially calcified lesion in the mid right kidney which appears new since the prior study. Massive enlargement of the prostate gland extending into the bladder. Prostate gland measures approximately 8 cm in diameter and fills most of the bladder. Stomach/Bowel: There is a lobulated annular mass involving the mid sigmoid portion of the colon best seen on image 61 of series 2. No obstruction. Moderate hiatal hernia. Small bowel appears normal. Appendix is normal. Vascular/Lymphatic: Aortic atherosclerosis. No discrete adenopathy. Reproductive: Massive enlargement of the prostate gland filling most of the bladder. Other: There is a small amount of ascites adjacent to the right lobe of the liver and in the right pericolic gutter extending into the right side of the pelvis. Anasarca. Musculoskeletal: There are extensive blastic and lytic lesions throughout the visualized portion of the skeleton consistent with metastatic prostate cancer. The overall appearance of the metastatic disease is significantly improved since the prior study. IMPRESSION: 1. New extensive metastatic disease throughout the liver creating hepatomegaly. New ill-defined 3.2 cm lesion in the posterior aspect of the spleen. New subcarinal adenopathy. 2. New annular mass in the mid sigmoid portion of the colon without obstruction. 3. Massive enlargement of the prostate gland. 4. Extensive blastic and lytic metastatic disease throughout the visualized portion of the skeleton from a previously demonstrated metastatic prostate cancer, significantly improved since the prior study. 5. 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 Alternatively, this could represent atypical metastatic prostate  cancer. Electronically Signed   By: Lorriane Shire M.D.   On: 09/16/2018 13:00   Dg Chest 2 View  Result Date:  09/16/2018 CLINICAL DATA:  "getting weaker and weaker". He has had a recent chemo. treatment for prostate cancer. He reported he had fallen at home, but has no injuries. He stated he was too weak to get up after the fall and he lied on the floor for approximately 2 hours until he was able to contact someone EXAM: CHEST - 2 VIEW COMPARISON:  none FINDINGS: Lungs are clear. Heart size and mediastinal contours are within normal limits. No effusion. Visualized bones unremarkable. IMPRESSION: No acute cardiopulmonary disease. Electronically Signed   By: Lucrezia Europe M.D.   On: 09/16/2018 11:33   Ct Head Wo Contrast  Result Date: 09/16/2018 CLINICAL DATA:  77 year old with head trauma. Receiving treatment for prostate cancer. Recent fall. EXAM: CT HEAD WITHOUT CONTRAST TECHNIQUE: Contiguous axial images were obtained from the base of the skull through the vertex without intravenous contrast. COMPARISON:  None. FINDINGS: Brain: No evidence for acute hemorrhage, mass lesion, midline shift, hydrocephalus or large infarct. Small amount of low density in the periventricular white matter. Mild cerebral atrophy. Vascular: No hyperdense vessel or unexpected calcification. Skull: Negative for fracture. Focal lucency involving the medial right mandibular condyle is nonspecific. No evidence for bone destruction involving the visualized right mandibular condyle. Sinuses/Orbits: No acute finding. Other: None. IMPRESSION: 1. No acute intracranial abnormality. 2. Mild atrophy. Mild white matter disease suggestive for chronic small vessel ischemic changes. Electronically Signed   By: Markus Daft M.D.   On: 09/16/2018 12:59   Ct L-spine No Charge  Result Date: 09/16/2018 CLINICAL DATA:  Pain secondary to a fall. Metastatic prostate cancer. Weakness. EXAM: CT LUMBAR SPINE WITHOUT CONTRAST TECHNIQUE: Multidetector CT  imaging of the lumbar spine was performed without intravenous contrast administration. Multiplanar CT image reconstructions were also generated. COMPARISON:  CT scan dated 03/09/2017 FINDINGS: Segmentation: 5 lumbar type vertebrae. Alignment: Normal. Vertebrae: Diffuse metastatic disease throughout the entire visualized portion of the spine. There are multiple lytic and blastic lesions in the spine. However, there has been improvement in the lytic lesions since the prior CT scan of 03/09/2017. There is an old pathologic fracture of the right side of the T12 vertebral body. No discrete definable tumor within the spinal canal. There blastic lesions in the visualized portions of the iliac bones. Paraspinal and other soft tissues: Negative. Disc levels: T11-12: Disc space narrowing. No disc bulging or protrusion. T12-L1: Disc space narrowing. No disc bulging or protrusion. L1-2: No disc bulging or protrusion. L2-3: Slight protrusion of the posterior inferior aspect of the L2 vertebral endplate into the spinal canal with only minimal indentation upon the thecal sac. No focal neural impingement. L3-4: Tiny broad-based disc bulge with slight narrowing of the spinal canal symmetrically. L4-5: Broad-based disc protrusion. Hypertrophy of the ligamentum flavum and facet joints combine to create severe spinal stenosis. Moderately severe bilateral facet arthritis. L5-S1: Small broad-based disc bulge. Severe bilateral facet arthritis and ligamentum flavum hypertrophy with moderate to severe spinal stenosis. IMPRESSION: 1. No acute abnormality of the lumbar spine. 2. Extensive blastic and lytic metastatic disease throughout the visualized portion of the spine and pelvic bones, improved slightly since the prior study of 03/09/2017. 3. 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 4. Moderate to severe spinal stenosis at L5-S1. Electronically Signed   By: Lorriane Shire M.D.   On: 09/16/2018 12:48   US Abdomen Limited Ruq  Result Date:  09/16/2018 CLINICAL DATA:  Acute RIGHT UPPER QUADRANT abdominal pain. EXAM: ULTRASOUND ABDOMEN LIMITED RIGHT UPPER QUADRANT COMPARISON:  CT abdomen and pelvis  performed earlier same day and on 03/09/2017 are correlated. FINDINGS: Gallbladder: Shadowing gallstones within the contracted gallbladder, the largest measuring approximately 1.1 cm. No gallbladder wall thickening or pericholecystic fluid. Negative sonographic Murphy's sign according to the ultrasound technologist. Common bile duct: Diameter: Approximately 8 mm centrally. Obscured distally by duodenal bowel gas. No visible bile duct stone. Liver: Multiple heterogeneous masses of mixed echotexture, indicating innumerable liver metastases as noted on the earlier CT. Portal vein is patent on color Doppler imaging with normal direction of blood flow towards the liver. IMPRESSION: 1. Cholelithiasis without sonographic evidence of cholecystitis. 2. No biliary ductal dilation. 3. Innumerable liver metastases as noted on the CT earlier same day. Electronically Signed   By: Evangeline Dakin M.D.   On: 09/16/2018 13:29    Procedures .Critical Care Performed by: Lennice Sites, DO Authorized by: Lennice Sites, DO   Critical care provider statement:    Critical care time (minutes):  45   Critical care time was exclusive of:  Teaching time and separately billable procedures and treating other patients   Critical care was necessary to treat or prevent imminent or life-threatening deterioration of the following conditions:  Sepsis and circulatory failure   Critical care was time spent personally by me on the following activities:  Blood draw for specimens, development of treatment plan with patient or surrogate, ordering and performing treatments and interventions, ordering and review of laboratory studies, ordering and review of radiographic studies, pulse oximetry, re-evaluation of patient's condition, review of old charts, obtaining history from patient or  surrogate, evaluation of patient's response to treatment and discussions with primary provider   I assumed direction of critical care for this patient from another provider in my specialty: no     (including critical care time)  Medications Ordered in ED Medications  piperacillin-tazobactam (ZOSYN) IVPB 3.375 g (3.375 g Intravenous New Bag/Given 09/16/18 1135)  lactated ringers bolus 1,000 mL (0 mLs Intravenous Stopped 09/16/18 1135)  lactated ringers bolus 1,000 mL (0 mLs Intravenous Stopped 09/16/18 1245)  lactated ringers bolus 750 mL (0 mLs Intravenous Stopped 09/16/18 1320)  vancomycin (VANCOCIN) IVPB 1000 mg/200 mL premix (0 mg Intravenous Stopped 09/16/18 1320)     Initial Impression / Assessment and Plan / ED Course  I have reviewed the triage vital signs and the nursing notes.  Pertinent labs & imaging results that were available during my care of the patient were reviewed by me and considered in my medical decision making (see chart for details).     Fed Ceci is a 77 year old male with history of prostate cancer who presents the ED with overall weakness.  Patient with mild hypotension upon arrival that eventually led to hypotension and 80 systolic.  Patient with no fever.  Patient had syncopal type event prior to arrival.  He states that he has had difficulty with generalized weakness.  Has not had much of an appetite.  Has weight loss.  Patient appears clinically dry on exam.  Appears neurologically intact.  Patient has obvious jaundice.  It appears that he recently saw GI doctor for this and they were concerned about progression of cancer with likely metastasis to his liver or other process.  He has no specific abdominal tenderness on exam, no cough, no sputum production.  Sepsis work-up was initiated given the hypotension.  Patient given 30 cc/kg of fluids.  Lactic acid was ordered.  Patient empirically given vancomycin and Zosyn.  Concern for possible cholangitis, urinary tract  infection.  However mostly concerned about  likely new cancer/metastasis.  Patient currently is on maintenance therapy for prostate cancer.  Patient with lactic acidosis of 7.76, repeat lactic acid was 8.08.  However likely from liver failure.  Patient with rising liver function, elevated bilirubin to 13.  CT scan of the abdomen pelvis shows new masses in the liver, colon likely metastasis.  There is no obstruction.  EKG showed sinus rhythm.  Troponin within normal limits.  Patient with acute kidney injury.  Likely from dehydration.  Mild leukocytosis.  Patient already covered for sepsis.  Has normal mental status.  No concern for cholangitis at this time given his mental status.  Right upper quadrant ultrasound showed no signs of gallbladder infection.  Had discussion about care plan goals with the patient.  Likely needs admission for further work-up but will likely need palliative care consultation at this time.  Oncology likely to be consulted to help with options.  Patient with new liver metastasis, colon metastasis.  Likely from prostate cancer. Patient remained hemodynamically improved following IV fluids.  Was admitted to hospitalist service for further care.  This chart was dictated using voice recognition software.  Despite best efforts to proofread,  errors can occur which can change the documentation meaning.   Final Clinical Impressions(s) / ED Diagnoses   Final diagnoses:  Pain  AKI (acute kidney injury) ([place])  Liver mass  Colonic mass  Elevated bilirubin  Elevated LFTs    ED Discharge Orders    None       Lennice Sites, DO 09/16/18 1509

## 2018-09-16 NOTE — Progress Notes (Signed)
CRITICAL VALUE ALERT  Critical Value:  Troponin 0.04  Date & Time Notied:  09/16/2018 1830  Provider Notified:  Dr. Bonner Puna  Orders Received/Actions taken: No new orders currently

## 2018-09-16 NOTE — ED Triage Notes (Signed)
He tells [place] that he has been "getting weaker and weaker". He has had a recent chemo. Treatment for prostate cancer. He arrives in no distress. He tells [place] he had fallen at home, but has no injuries. He states he was too weak to get up after the fall and he lied on the floor for ~ 2 hours until he was able to contact someone.

## 2018-09-16 NOTE — ED Notes (Signed)
ED TO INPATIENT HANDOFF REPORT  Name/Age/Gender Gregory Chaney 77 y.o. male  Code Status Code Status History    Date Active Date Inactive Code Status Order ID Comments User Context   03/09/2017 2012 03/10/2017 2013 DNR [id number]  Jule Ser, DO ED    Questions for Most Recent Historical Code Status (Order [id number])    Question Answer Comment   In the event of cardiac or respiratory ARREST Do not call a "code blue"    In the event of cardiac or respiratory ARREST Do not perform Intubation, CPR, defibrillation or ACLS    In the event of cardiac or respiratory ARREST Use medication by any route, position, wound care, and other measures to relive pain and suffering. May use oxygen, suction and manual treatment of airway obstruction as needed for comfort.       Home/SNF/Other Home  Chief Complaint Fall, weakness, nausea  Level of Care/Admitting Diagnosis ED Disposition    ED Disposition Condition [facility] Area: [place] [100102]  Level of Care: Stepdown [14]  Admit to SDU based on following criteria: Hemodynamic compromise or significant risk of instability:  Patient requiring short term acute titration and management of vasoactive drips, and invasive monitoring (i.e., CVP and Arterial line).  Diagnosis: Lactic acidosis [381017]  Admitting Physician: Patrecia Pour [[id number]]  Attending Physician: Patrecia Pour [phone]  Estimated length of stay: past midnight tomorrow  Certification:: I certify this patient will need inpatient services for at least 2 midnights  PT Class (Do Not Modify): Inpatient [101]  PT Acc Code (Do Not Modify): Private [1]       Medical History Past Medical History:  Diagnosis Date  . History of radiation therapy 11/01/16- 11/12/16   Lower spine L3- S4/ 30 Gy in 10 fractions  . Obesity   . Prostate cancer ([place]) 1995    Allergies Allergies  Allergen Reactions  . Cephalexin Other (See Comments)    hallucinations  .  Hydrocodone Other (See Comments)    Confusion,my mind doesn't work well   . Morphine Other (See Comments)     Confusion. My mind does not work right  . Flomax [Tamsulosin Hcl] Other (See Comments)    My mind doesn't work well  . Percocet [Oxycodone-Acetaminophen] Nausea Only    IV Location/Drains/Wounds Patient Lines/Drains/Airways Status   Active Line/Drains/Airways    Name:   Placement date:   Placement time:   Site:   Days:   Peripheral IV 09/16/18 Left Hand   09/16/18    1002    Hand   less than 1   Peripheral IV 09/16/18 Right Antecubital   09/16/18    1218    Antecubital   less than 1          Labs/Imaging Results for orders placed or performed during the hospital encounter of 09/16/18 (from the past 48 hour(s))  Protime-INR     Status: Abnormal   Collection Time: 09/16/18 10:36 AM  Result Value Ref Range   Prothrombin Time 17.7 (H) 11.4 - 15.2 seconds   INR 1.48     Comment: Performed at [facility], [place] [address] [place], [place] [address]  Comprehensive metabolic panel     Status: Abnormal   Collection Time: 09/16/18 10:38 AM  Result Value Ref Range   Sodium 137 135 - 145 mmol/L   Potassium 3.5 3.5 - 5.1 mmol/L   Chloride 99 98 - 111 mmol/L   CO2 18 (L)  22 - 32 mmol/L   Glucose, Bld 104 (H) 70 - 99 mg/dL   BUN 41 (H) 8 - 23 mg/dL   Creatinine, Ser 2.75 (H) 0.61 - 1.24 mg/dL   Calcium 8.7 (L) 8.9 - 10.3 mg/dL   Total Protein 7.5 6.5 - 8.1 g/dL   Albumin 2.0 (L) 3.5 - 5.0 g/dL   AST 233 (H) 15 - 41 U/L   ALT 74 (H) 0 - 44 U/L   Alkaline Phosphatase 384 (H) 38 - 126 U/L   Total Bilirubin 13.0 (H) 0.3 - 1.2 mg/dL   GFR calc non Af Amer 21 (L) >60 mL/min   GFR calc Af Amer 25 (L) >60 mL/min   Anion gap 20 (H) 5 - 15    Comment: Performed at [facility], [place] [address], [place], [place] [address]  CBC WITH DIFFERENTIAL     Status: Abnormal   Collection Time: 09/16/18 10:38 AM  Result Value Ref Range   WBC 12.5 (H) 4.0 -  10.5 K/uL   RBC 4.40 4.22 - 5.81 MIL/uL   Hemoglobin 10.0 (L) 13.0 - 17.0 g/dL   HCT 29.5 (L) 39.0 - 52.0 %   MCV 67.0 (L) 80.0 - 100.0 fL   MCH 22.7 (L) 26.0 - 34.0 pg   MCHC 33.9 30.0 - 36.0 g/dL   RDW 23.9 (H) 11.5 - 15.5 %   Platelets 214 150 - 400 K/uL   nRBC 0.6 (H) 0.0 - 0.2 %   Neutrophils Relative % 81 %   Neutro Abs 10.1 (H) 1.7 - 7.7 K/uL   Lymphocytes Relative 11 %   Lymphs Abs 1.4 0.7 - 4.0 K/uL   Monocytes Relative 7 %   Monocytes Absolute 0.9 0.1 - 1.0 K/uL   Eosinophils Relative 0 %   Eosinophils Absolute 0.0 0.0 - 0.5 K/uL   Basophils Relative 0 %   Basophils Absolute 0.0 0.0 - 0.1 K/uL   Immature Granulocytes 1 %   Abs Immature Granulocytes 0.12 (H) 0.00 - 0.07 K/uL   Target Cells PRESENT     Comment: Performed at [facility], [place] [address] [place], [place] [address]  Lipase, blood     Status: None   Collection Time: 09/16/18 10:38 AM  Result Value Ref Range   Lipase 40 11 - 51 U/L    Comment: Performed at [facility], [place] [address] [place], [place] [address]  I-Stat Troponin, ED (not at [facility])     Status: None   Collection Time: 09/16/18 10:39 AM  Result Value Ref Range   Troponin i, poc 0.06 0.00 - 0.08 ng/mL   Comment 3            Comment: Due to the release kinetics of cTnI, a negative result within the first hours of the onset of symptoms does not rule out myocardial infarction with certainty. If myocardial infarction is still suspected, repeat the test at appropriate intervals.   I-Stat CG4 Lactic Acid, ED     Status: Abnormal   Collection Time: 09/16/18 10:41 AM  Result Value Ref Range   Lactic Acid, Venous 7.76 (HH) 0.5 - 1.9 mmol/L   Comment NOTIFIED PHYSICIAN   Ammonia     Status: Abnormal   Collection Time: 09/16/18 11:58 AM  Result Value Ref Range   Ammonia 44 (H) 9 - 35 umol/L    Comment: Performed at [facility], [place] [address], [place], [place] [address]  I-Stat CG4 Lactic  Acid, ED     Status: Abnormal   Collection Time: 09/16/18 12:37 PM  Result Value Ref Range   Lactic Acid, Venous 8.08 (HH) 0.5 - 1.9 mmol/L   Comment NOTIFIED PHYSICIAN    Ct Abdomen Pelvis Wo Contrast  Result Date: 09/16/2018 CLINICAL DATA:  Abdominal distention. Metastatic prostate cancer. EXAM: CT ABDOMEN AND PELVIS WITHOUT CONTRAST TECHNIQUE: Multidetector CT imaging of the abdomen and pelvis was performed following the standard protocol without IV contrast. COMPARISON:  CT scan dated 03/09/2017 FINDINGS: Lower chest: On image number 1 there is an enlarged subcarinal lymph node, 18 mm in diameter. Heart size is normal. Minimal pericardial effusion. Minimal linear scarring at the lung bases. No effusions. Hepatobiliary: New extensive metastatic disease throughout the liver with new hepatomegaly due to the metastatic disease. Cholelithiasis. No dilated bile ducts. Pancreas: Unremarkable. No pancreatic ductal dilatation or surrounding inflammatory changes. Spleen: New ill-defined 3.2 cm lesion in the posterior aspect of the spleen. Adrenals/Urinary Tract: Adrenal glands are normal. No hydronephrosis. 16 mm partially calcified lesion in the mid right kidney which appears new since the prior study. Massive enlargement of the prostate gland extending into the bladder. Prostate gland measures approximately 8 cm in diameter and fills most of the bladder. Stomach/Bowel: There is a lobulated annular mass involving the mid sigmoid portion of the colon best seen on image 61 of series 2. No obstruction. Moderate hiatal hernia. Small bowel appears normal. Appendix is normal. Vascular/Lymphatic: Aortic atherosclerosis. No discrete adenopathy. Reproductive: Massive enlargement of the prostate gland filling most of the bladder. Other: There is a small amount of ascites adjacent to the right lobe of the liver and in the right pericolic gutter extending into the right side of the pelvis. Anasarca. Musculoskeletal: There  are extensive blastic and lytic lesions throughout the visualized portion of the skeleton consistent with metastatic prostate cancer. The overall appearance of the metastatic disease is significantly improved since the prior study. IMPRESSION: 1. New extensive metastatic disease throughout the liver creating hepatomegaly. New ill-defined 3.2 cm lesion in the posterior aspect of the spleen. New subcarinal adenopathy. 2. New annular mass in the mid sigmoid portion of the colon without obstruction. 3. Massive enlargement of the prostate gland. 4. Extensive blastic and lytic metastatic disease throughout the visualized portion of the skeleton from a previously demonstrated metastatic prostate cancer, significantly improved since the prior study. 5. 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 Alternatively, this could represent atypical metastatic prostate cancer. Electronically Signed   By: Lorriane Shire M.D.   On: 09/16/2018 13:00   Dg Chest 2 View  Result Date: 09/16/2018 CLINICAL DATA:  "getting weaker and weaker". He has had a recent chemo. treatment for prostate cancer. He reported he had fallen at home, but has no injuries. He stated he was too weak to get up after the fall and he lied on the floor for approximately 2 hours until he was able to contact someone EXAM: CHEST - 2 VIEW COMPARISON:  none FINDINGS: Lungs are clear. Heart size and mediastinal contours are within normal limits. No effusion. Visualized bones unremarkable. IMPRESSION: No acute cardiopulmonary disease. Electronically Signed   By: Lucrezia Europe M.D.   On: 09/16/2018 11:33   Ct Head Wo Contrast  Result Date: 09/16/2018 CLINICAL DATA:  77 year old with head trauma. Receiving treatment for prostate cancer. Recent fall. EXAM: CT HEAD WITHOUT CONTRAST TECHNIQUE: Contiguous axial images were obtained  from the base of the skull through the vertex  without intravenous contrast. COMPARISON:  None. FINDINGS: Brain: No evidence for acute hemorrhage, mass lesion, midline shift, hydrocephalus or large infarct. Small amount of low density in the periventricular white matter. Mild cerebral atrophy. Vascular: No hyperdense vessel or unexpected calcification. Skull: Negative for fracture. Focal lucency involving the medial right mandibular condyle is nonspecific. No evidence for bone destruction involving the visualized right mandibular condyle. Sinuses/Orbits: No acute finding. Other: None. IMPRESSION: 1. No acute intracranial abnormality. 2. Mild atrophy. Mild white matter disease suggestive for chronic small vessel ischemic changes. Electronically Signed   By: Markus Daft M.D.   On: 09/16/2018 12:59   Ct L-spine No Charge  Result Date: 09/16/2018 CLINICAL DATA:  Pain secondary to a fall. Metastatic prostate cancer. Weakness. EXAM: CT LUMBAR SPINE WITHOUT CONTRAST TECHNIQUE: Multidetector CT imaging of the lumbar spine was performed without intravenous contrast administration. Multiplanar CT image reconstructions were also generated. COMPARISON:  CT scan dated 03/09/2017 FINDINGS: Segmentation: 5 lumbar type vertebrae. Alignment: Normal. Vertebrae: Diffuse metastatic disease throughout the entire visualized portion of the spine. There are multiple lytic and blastic lesions in the spine. However, there has been improvement in the lytic lesions since the prior CT scan of 03/09/2017. There is an old pathologic fracture of the right side of the T12 vertebral body. No discrete definable tumor within the spinal canal. There blastic lesions in the visualized portions of the iliac bones. Paraspinal and other soft tissues: Negative. Disc levels: T11-12: Disc space narrowing. No disc bulging or protrusion. T12-L1: Disc space narrowing. No disc bulging or protrusion. L1-2: No disc bulging or protrusion. L2-3: Slight protrusion of the posterior inferior aspect of the L2  vertebral endplate into the spinal canal with only minimal indentation upon the thecal sac. No focal neural impingement. L3-4: Tiny broad-based disc bulge with slight narrowing of the spinal canal symmetrically. L4-5: Broad-based disc protrusion. Hypertrophy of the ligamentum flavum and facet joints combine to create severe spinal stenosis. Moderately severe bilateral facet arthritis. L5-S1: Small broad-based disc bulge. Severe bilateral facet arthritis and ligamentum flavum hypertrophy with moderate to severe spinal stenosis. IMPRESSION: 1. No acute abnormality of the lumbar spine. 2. Extensive blastic and lytic metastatic disease throughout the visualized portion of the spine and pelvic bones, improved slightly since the prior study of 03/09/2017. 3. 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 4. Moderate to severe spinal stenosis at L5-S1. Electronically Signed   By: Lorriane Shire M.D.   On: 09/16/2018 12:48   US Abdomen Limited Ruq  Result Date: 09/16/2018 CLINICAL DATA:  Acute RIGHT UPPER QUADRANT abdominal pain. EXAM: ULTRASOUND ABDOMEN LIMITED RIGHT UPPER QUADRANT COMPARISON:  CT abdomen and pelvis performed earlier same day and on 03/09/2017 are correlated. FINDINGS: Gallbladder: Shadowing gallstones within the contracted gallbladder, the largest measuring approximately 1.1 cm. No gallbladder wall thickening or pericholecystic fluid. Negative sonographic Murphy's sign according to the ultrasound technologist. Common bile duct: Diameter: Approximately 8 mm centrally. Obscured distally by duodenal bowel gas. No visible bile duct stone. Liver: Multiple heterogeneous masses of mixed echotexture, indicating innumerable liver metastases as noted on the earlier CT. Portal vein is patent on color Doppler imaging with normal direction of blood flow towards the liver. IMPRESSION: 1. Cholelithiasis without sonographic evidence of cholecystitis. 2. No biliary ductal dilation. 3. Innumerable liver metastases as noted on the CT  earlier same day. Electronically Signed   By: Evangeline Dakin M.D.   On: 09/16/2018 13:29   EKG Interpretation  Date/Time:  Saturday September 16 2018 10:08:52 EST Ventricular Rate:  86 PR Interval:    QRS Duration: 130 QT Interval:  424 QTC Calculation: 508 R Axis:   -14 Text Interpretation:  Sinus rhythm Right bundle branch block Confirmed by Lennice Sites [phone]) on 09/16/2018 11:01:01 AM   Pending Labs Unresulted Labs (From admission, onward)    Start     Ordered   09/16/18 0951  Urine culture  ONCE - STAT,   STAT     09/16/18 0951   09/16/18 0950  Blood Culture (routine x 2)  BLOOD CULTURE X 2,   STAT     09/16/18 0951   09/16/18 0950  Urinalysis, Routine w reflex microscopic  ONCE - STAT,   STAT     09/16/18 0951          Vitals/Pain Today's Vitals   09/16/18 1400 09/16/18 1430 09/16/18 1500 09/16/18 1530  BP: (!) 92/57 92/63 95/62  (!) 97/57  Pulse: 80 76 83 82  Resp: (!) 24 (!) 22 18 (!) 22  Temp:      TempSrc:      SpO2:  99% 99% 98%  PainSc:        Isolation Precautions No active isolations  Medications Medications  piperacillin-tazobactam (ZOSYN) IVPB 3.375 g (3.375 g Intravenous New Bag/Given 09/16/18 1135)  lactated ringers bolus 1,000 mL (0 mLs Intravenous Stopped 09/16/18 1135)  lactated ringers bolus 1,000 mL (0 mLs Intravenous Stopped 09/16/18 1245)  lactated ringers bolus 750 mL (0 mLs Intravenous Stopped 09/16/18 1320)  vancomycin (VANCOCIN) IVPB 1000 mg/200 mL premix (0 mg Intravenous Stopped 09/16/18 1320)    Mobility walks

## 2018-09-16 NOTE — Progress Notes (Signed)
A consult was received from an ED physician for vanc/zosyn per pharmacy dosing.  The patient's profile has been reviewed for ht/wt/allergies/indication/available labs.   A one time order has been placed for vanc 1g and zosyn 3.375g.  Further antibiotics/pharmacy consults should be ordered by admitting physician if indicated.                       Thank you, Kara Mead 09/16/2018  10:47 AM

## 2018-09-16 NOTE — ED Notes (Signed)
Bed: WA09 Expected date:  Expected time:  Means of arrival:  Comments: EMS/fall

## 2018-09-16 NOTE — Progress Notes (Signed)
Pharmacy Antibiotic Note  Gregory Chaney is a 77 y.o. male admitted on 09/16/2018 after falling at home, weakness. Found to have leukocytosis, lactic acidosis, and ARF (CrCl ~25). Known mets to the liver. Given Vanc and Zosyn x 1 in the ED. Pharmacy is asked to continue Zosyn for suspected     intra-abdominal infection/cholangitis.   Plan: Zosyn 3.375g IV q8h (4 hour infusion).  Follow up renal function, culture results, and clinical course.   Height: 5\' 9"  (175.3 cm) Weight: 196 lb 10.4 oz (89.2 kg) IBW/kg (Calculated) : 70.7  Temp (24hrs), Avg:97.4 F (36.3 C), Min:97.3 F (36.3 C), Max:97.6 F (36.4 C)  Recent Labs  Lab 09/16/18 1038 09/16/18 1041 09/16/18 1237  WBC 12.5*  --   --   CREATININE 2.75*  --   --   LATICACIDVEN  --  7.76* 8.08*    Estimated Creatinine Clearance: 25.2 mL/min (A) (by C-G formula based on SCr of 2.75 mg/dL (H)).    Allergies  Allergen Reactions  . Cephalexin Other (See Comments)    hallucinations  . Hydrocodone Other (See Comments)    Confusion,my mind doesn't work well   . Morphine Other (See Comments)     Confusion. My mind does not work right  . Flomax [Tamsulosin Hcl] Other (See Comments)    My mind doesn't work well  . Percocet [Oxycodone-Acetaminophen] Nausea Only    Antimicrobials this admission: 12/28 Vanc >>  12/28 Zosyn >>   Dose adjustments this admission: --  Microbiology results: BCx: UCx:  Sputum:  MRSA PCR:  Thank you for allowing pharmacy to be a part of this patient's care.  Romeo Rabon, PharmD. Mobile: [phone]. 09/16/2018,4:53 PM.

## 2018-09-16 NOTE — Consult Note (Signed)
[facility] [phone]   [phone]  CONSULT NOTE  REFERRING PHYSICIAN: Dr. 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  HPI Gregory Chaney is a 77 y.o. male a patient of Dr. Alen Blew with past medical history significant for history of prostate cancer that was recently metastatic and the patient was treated with Lupron under the care of Dr. Alen Blew.  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  The patient does appear to follow-up with Dr. Alen Blew since August 2018.  He was supposed to come back to the office few days ago but he also missed his appointment.  He presented to the emergency department today complaining of generalized weakness and fatigue as well as abdominal pain.  He also passed out today.  He has mild nausea but no vomiting.  He denied having any chest pain, shortness of breath, cough or hemoptysis.  He has no headache or visual changes.  He lost more than 30 pounds in the last few months.  He was evaluated recently by gastroenterology for dysphagia.  During his evaluation he had ultrasound of the abdomen that showed cholelithiasis without sonographic evidence of cholecystitis.  There was no biliary ductal dilatation but there was innumerable liver metastasis.  CT scan of the abdomen and pelvis without contrast performed earlier today showed new extensive metastatic disease throughout the liver creating hepatomegaly.  There was new ill-defined 3.2 cm lesion in the posterior aspect of the spleen as well as new subcarinal adenopathy.  The scan also showed new annular mass in the mid sigmoid portion of the colon without obstruction.  It also showed the massive enlargement of the prostate gland as well as extensive blastic and lytic metastatic disease throughout the  visualized portion of the skeleton from the previously demonstrated metastatic prostate cancer and this is significantly improved since the prior study.  These findings were suggestive of new carcinoma of the distal colon with metastatic disease to the spleen, liver and mediastinum.  Alternatively this could represent atypical metastatic prostate cancer.  I was consulted to evaluate the patient today and give recommendation regarding these new findings in the absence of Dr. Alen Blew.  He also had a scan of the head without contrast that showed no acute intracranial abnormalities.  His lab work today showed mild leukocytosis probably secondary to the cholelithiasis and sepsis.  The CBC also showed anemia with hemoglobin of 10 but platelets count were normal at 214,000.  Comprehensive metabolic panel and showed elevated liver enzymes in addition to acute renal insufficiency with creatinine of 2.75.  His ammonia level was also elevated at 44 and the patient had venous lactic acid of 8.08. When seen today he continues to complain of the abdominal pain mainly in the right upper quadrant in addition to the significant fatigue and weakness and mild nausea. Family history is unremarkable for colon cancer. The patient is single and has 1 son who lives in [place].  He has a history for smoking and no current history of alcohol or drug abuse.  HPI  Past Medical History:  Diagnosis Date  . History of radiation therapy 11/01/16- 11/12/16   Lower spine L3- S4/ 30 Gy in 10 fractions  . Obesity   . Prostate cancer ([facility]) 1995    Past Surgical History:  Procedure Laterality Date  . CYSTOSCOPY  06/13/2014  cystoscopy with transrectal ultrasound biopsy of prostate    Family History  Problem Relation Age of Onset  . Colon cancer Neg Hx     Social History Social History   Tobacco Use  . Smoking status: Never Smoker  . Smokeless tobacco: Never Used  Substance Use Topics  . Alcohol use: No  .  Drug use: No    Allergies  Allergen Reactions  . Cephalexin Other (See Comments)    hallucinations  . Hydrocodone Other (See Comments)    Confusion,my mind doesn't work well   . Morphine Other (See Comments)     Confusion. My mind does not work right  . Flomax [Tamsulosin Hcl] Other (See Comments)    My mind doesn't work well  . Percocet [Oxycodone-Acetaminophen] Nausea Only    No current facility-administered medications for this encounter.    Current Outpatient Medications  Medication Sig Dispense Refill  . pantoprazole (PROTONIX) 40 MG tablet Take 1 tablet 30-60 min before breakfast. 30 tablet 3    Review of Systems  Constitutional: positive for anorexia, fatigue and weight loss Eyes: negative Ears, nose, mouth, throat, and face: negative Respiratory: negative Cardiovascular: negative Gastrointestinal: positive for abdominal pain, change in bowel habits, dysphagia and nausea Genitourinary:negative Integument/breast: negative Hematologic/lymphatic: negative Musculoskeletal:negative Neurological: negative Behavioral/Psych: negative Endocrine: negative Allergic/Immunologic: negative  Physical Exam  FYB:OFBPZ, healthy, no distress, cachectic, ill looking and malnourished SKIN: skin color, texture, turgor are normal, no rashes or significant lesions HEAD: Normocephalic, No masses, lesions, tenderness or abnormalities EYES: normal, PERRLA, Conjunctiva are pink and non-injected EARS: External ears normal, Canals clear OROPHARYNX:no exudate, no erythema and lips, buccal mucosa, and tongue normal  NECK: supple, no adenopathy, no JVD LYMPH:  no palpable lymphadenopathy, no hepatosplenomegaly LUNGS: clear to auscultation , and palpation HEART: regular rate & rhythm and no murmurs ABDOMEN: Tenderness in the right upper quadrant.  There was also palpable hepatomegaly. BACK: Range of motion is normal EXTREMITIES:no joint deformities, effusion, or inflammation, no edema    NEURO: alert & oriented x 3 with fluent speech, no focal motor/sensory deficits  PERFORMANCE STATUS: ECOG 2  LABORATORY DATA: Lab Results  Component Value Date   WBC 12.5 (H) 09/16/2018   HGB 10.0 (L) 09/16/2018   HCT 29.5 (L) 09/16/2018   MCV 67.0 (L) 09/16/2018   PLT 214 09/16/2018    @LASTCHEM @  RADIOGRAPHIC STUDIES: Ct Abdomen Pelvis Wo Contrast  Result Date: 09/16/2018 CLINICAL DATA:  Abdominal distention. Metastatic prostate cancer. EXAM: CT ABDOMEN AND PELVIS WITHOUT CONTRAST TECHNIQUE: Multidetector CT imaging of the abdomen and pelvis was performed following the standard protocol without IV contrast. COMPARISON:  CT scan dated 03/09/2017 FINDINGS: Lower chest: On image number 1 there is an enlarged subcarinal lymph node, 18 mm in diameter. Heart size is normal. Minimal pericardial effusion. Minimal linear scarring at the lung bases. No effusions. Hepatobiliary: New extensive metastatic disease throughout the liver with new hepatomegaly due to the metastatic disease. Cholelithiasis. No dilated bile ducts. Pancreas: Unremarkable. No pancreatic ductal dilatation or surrounding inflammatory changes. Spleen: New ill-defined 3.2 cm lesion in the posterior aspect of the spleen. Adrenals/Urinary Tract: Adrenal glands are normal. No hydronephrosis. 16 mm partially calcified lesion in the mid right kidney which appears new since the prior study. Massive enlargement of the prostate gland extending into the bladder. Prostate gland measures approximately 8 cm in diameter and fills most of the bladder. Stomach/Bowel: There is a lobulated annular mass involving the mid sigmoid portion of the colon best seen on image  61 of series 2. No obstruction. Moderate hiatal hernia. Small bowel appears normal. Appendix is normal. Vascular/Lymphatic: Aortic atherosclerosis. No discrete adenopathy. Reproductive: Massive enlargement of the prostate gland filling most of the bladder. Other: There is a small amount  of ascites adjacent to the right lobe of the liver and in the right pericolic gutter extending into the right side of the pelvis. Anasarca. Musculoskeletal: There are extensive blastic and lytic lesions throughout the visualized portion of the skeleton consistent with metastatic prostate cancer. The overall appearance of the metastatic disease is significantly improved since the prior study. IMPRESSION: 1. New extensive metastatic disease throughout the liver creating hepatomegaly. New ill-defined 3.2 cm lesion in the posterior aspect of the spleen. New subcarinal adenopathy. 2. New annular mass in the mid sigmoid portion of the colon without obstruction. 3. Massive enlargement of the prostate gland. 4. Extensive blastic and lytic metastatic disease throughout the visualized portion of the skeleton from a previously demonstrated metastatic prostate cancer, significantly improved since the prior study. 5. 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 Alternatively, this could represent atypical metastatic prostate cancer. Electronically Signed   By: Lorriane Shire M.D.   On: 09/16/2018 13:00   Dg Chest 2 View  Result Date: 09/16/2018 CLINICAL DATA:  "getting weaker and weaker". He has had a recent chemo. treatment for prostate cancer. He reported he had fallen at home, but has no injuries. He stated he was too weak to get up after the fall and he lied on the floor for approximately 2 hours until he was able to contact someone EXAM: CHEST - 2 VIEW COMPARISON:  none FINDINGS: Lungs are clear. Heart size and mediastinal contours are within normal limits. No effusion. Visualized bones unremarkable. IMPRESSION: No acute cardiopulmonary disease. Electronically Signed   By: Lucrezia Europe M.D.   On: 09/16/2018 11:33   Dg Chest 2 View  Result Date: 09/05/2018 CLINICAL DATA:  Dysphagia. EXAM: CHEST - 2 VIEW COMPARISON:  March 09, 2017 FINDINGS: The heart size and mediastinal contours are within normal limits. Both lungs are clear. The visualized skeletal structures are unremarkable. IMPRESSION: No active cardiopulmonary disease. Electronically Signed   By: Dorise Bullion III M.D   On: 09/05/2018 16:56   Ct Head Wo Contrast  Result Date: 09/16/2018 CLINICAL DATA:  77 year old with head trauma. Receiving treatment for prostate cancer. Recent fall. EXAM: CT HEAD WITHOUT CONTRAST TECHNIQUE: Contiguous axial images were obtained from the base of the skull through the vertex without intravenous contrast. COMPARISON:  None. FINDINGS: Brain: No evidence for acute hemorrhage, mass lesion, midline shift, hydrocephalus or large infarct. Small amount of low density in the periventricular white matter. Mild cerebral atrophy. Vascular: No hyperdense vessel or unexpected calcification. Skull: Negative for fracture. Focal lucency involving the medial right mandibular condyle is nonspecific. No evidence for bone destruction involving the visualized right mandibular condyle. Sinuses/Orbits: No acute finding. Other: None. IMPRESSION: 1. No acute intracranial abnormality. 2. Mild atrophy. Mild white matter disease suggestive for chronic small vessel ischemic changes. Electronically Signed   By: Markus Daft M.D.   On: 09/16/2018 12:59   Ct L-spine No Charge  Result Date: 09/16/2018 CLINICAL DATA:  Pain secondary to a fall. Metastatic prostate cancer. Weakness. EXAM: CT LUMBAR SPINE WITHOUT CONTRAST TECHNIQUE: Multidetector CT imaging of the lumbar spine was performed without intravenous contrast administration. Multiplanar CT image reconstructions were also generated. COMPARISON:  CT  scan dated 03/09/2017 FINDINGS: Segmentation: 5 lumbar type vertebrae. Alignment: Normal. Vertebrae: Diffuse metastatic disease throughout the entire visualized portion of the spine. There are multiple lytic and blastic lesions in the spine. However, there has been  improvement in the lytic lesions since the prior CT scan of 03/09/2017. There is an old pathologic fracture of the right side of the T12 vertebral body. No discrete definable tumor within the spinal canal. There blastic lesions in the visualized portions of the iliac bones. Paraspinal and other soft tissues: Negative. Disc levels: T11-12: Disc space narrowing. No disc bulging or protrusion. T12-L1: Disc space narrowing. No disc bulging or protrusion. L1-2: No disc bulging or protrusion. L2-3: Slight protrusion of the posterior inferior aspect of the L2 vertebral endplate into the spinal canal with only minimal indentation upon the thecal sac. No focal neural impingement. L3-4: Tiny broad-based disc bulge with slight narrowing of the spinal canal symmetrically. L4-5: Broad-based disc protrusion. Hypertrophy of the ligamentum flavum and facet joints combine to create severe spinal stenosis. Moderately severe bilateral facet arthritis. L5-S1: Small broad-based disc bulge. Severe bilateral facet arthritis and ligamentum flavum hypertrophy with moderate to severe spinal stenosis. IMPRESSION: 1. No acute abnormality of the lumbar spine. 2. Extensive blastic and lytic metastatic disease throughout the visualized portion of the spine and pelvic bones, improved slightly since the prior study of 03/09/2017. 3. 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 4. Moderate to severe spinal stenosis at L5-S1. Electronically Signed   By: Lorriane Shire M.D.   On: 09/16/2018 12:48   US Abdomen Limited Ruq  Result Date: 09/16/2018 CLINICAL DATA:  Acute RIGHT UPPER QUADRANT abdominal pain. EXAM: ULTRASOUND ABDOMEN LIMITED RIGHT UPPER QUADRANT COMPARISON:  CT abdomen and pelvis performed earlier same day and on 03/09/2017 are correlated. FINDINGS: Gallbladder: Shadowing gallstones within the contracted gallbladder, the largest measuring approximately 1.1 cm. No gallbladder wall thickening or pericholecystic fluid. Negative sonographic Murphy's  sign according to the ultrasound technologist. Common bile duct: Diameter: Approximately 8 mm centrally. Obscured distally by duodenal bowel gas. No visible bile duct stone. Liver: Multiple heterogeneous masses of mixed echotexture, indicating innumerable liver metastases as noted on the earlier CT. Portal vein is patent on color Doppler imaging with normal direction of blood flow towards the liver. IMPRESSION: 1. Cholelithiasis without sonographic evidence of cholecystitis. 2. No biliary ductal dilation. 3. Innumerable liver metastases as noted on the CT earlier same day. Electronically Signed   By: Evangeline Dakin M.D.   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 Alen Blew but it looks like the last dose of his treatment was given in August 2018 and the patient was lost to follow-up since that time The patient presented today with concerning findings for metastatic colon adenocarcinoma involving the liver, spleen as well as the mediastinum. He also presented with cholelithiasis and questionable sepsis with elevated lactic acidosis.  PLAN: I had a lengthy discussion with the patient today about his current condition and further investigation to confirm his new diagnosis of colon cancer. The patient will need to be stabilized first from the recent sepsis with aggressive treatment with antibiotics.  I do not think he is a good candidate for cholecystectomy. The patient will have a gastroenterology consult during his hospitalization for evaluation of the mid sigmoid mass  and consideration of biopsy. I will ask Dr. Alen Blew to see the patient for any further recommendation or arrange for him to come to the cancer center after discharge for more detailed discussion of his  treatment options. This patient has multiple comorbidities and his prognosis is not good at this time. For the acute renal insufficiency, this is likely secondary to poor p.o. intake and dehydration.  The patient will need aggressive hydration and close monitoring of his input and output during this hospitalization. The patient voices understanding of current disease status and treatment options and is in agreement with the current care plan.  All questions were answered. The patient knows to call the clinic with any problems, questions or concerns. We can certainly see the patient much sooner if necessary.  Thank you so much for allowing me to participate in the care of Gregory Chaney. I will continue to follow up the patient with you and assist in his care.  Disclaimer: This note was dictated with voice recognition software. Similar sounding words can inadvertently be transcribed and may not be corrected upon review.   Eilleen Kempf September 16, 2018, 3:36 PM

## 2018-09-16 NOTE — ED Notes (Signed)
Report given to Amy RN for 2W, Room 1224.

## 2018-09-16 NOTE — H&P (Signed)
History and Physical   Gregory Chaney FOY:[id number] DOB: 04/16/1941 DOA: 09/16/2018  Referring MD/NP/PA: Dr. Ronnald Nian, [place] PCP: [name], NP Outpatient Specialists: Oncology, Dr. Alen Blew; [facility]  Patient coming from: Home  Chief Complaint: Fall at home, weakness  HPI: Gregory Chaney is a 77 y.o. male with a history of prostate CA with bony metastases s/p lupron and XRT to L3-L4 who presented to the ED by EMS after being too weak to pick himself up after falling at home. He is not sure what happened, but he found himself on the floor at home where he lives alone and took 2 hours to get to a phone to call his neighbor who is his sole caretaker. He has been losing weight steadily and has reported trouble swallowing to his GI provider, but denies any real concerns at time of evaluation. He is very hard of hearing and does not have hearing aids with him which somewhat limits the history.  ED Course: Hypotensive and appearing jaundiced, chronically ill-appearing with lactic acidosis and AKI with worsening LFT elevations and WBC 12.5k. CT head nonacute, CT abd/pelvis demonstrated extensive new metastases throughout the liver, in the spleen, and subcarinal lymph nodes thought to be attributable to a new annular mid-sigmoid mass more likely than prostate CA primary. Lytic and blastic bony metastases were also noted, though were decreased in the interval from previous scans.   Review of Systems: Chronic bilateral leg swelling is worsening over past few weeks, limiting ambulation. Does admit to back pain in the ED, though not severe. No numbness or weakness in the legs. No urinary retention reported. Denies fever, chills, changes in vision or hearing, headache, cough, sore throat, chest pain, palpitations, shortness of breath, abdominal pain, nausea, vomiting, changes in bowel habits, blood in stool, change in bladder habits, myalgias, arthralgias, and rash, and per HPI. All others reviewed and are negative.     Past Medical History:  Diagnosis Date  . History of radiation therapy 11/01/16- 11/12/16   Lower spine L3- S4/ 30 Gy in 10 fractions  . Obesity   . Prostate cancer ([place]) 1995   Past Surgical History:  Procedure Laterality Date  . CYSTOSCOPY  06/13/2014   cystoscopy with transrectal ultrasound biopsy of prostate   - Nonsmoker, lives alone, no EtOH or other drugs.   reports that he has never smoked. He has never used smokeless tobacco. He reports that he does not drink alcohol or use drugs. Allergies  Allergen Reactions  . Cephalexin Other (See Comments)    hallucinations  . Hydrocodone Other (See Comments)    Confusion,my mind doesn't work well   . Morphine Other (See Comments)     Confusion. My mind does not work right  . Flomax [Tamsulosin Hcl] Other (See Comments)    My mind doesn't work well  . Percocet [Oxycodone-Acetaminophen] Nausea Only   Family History  Problem Relation Age of Onset  . Colon cancer Neg Hx    - Family history otherwise reviewed and not pertinent.  Prior to Admission medications   Medication Sig Start Date End Date Taking? Authorizing Provider  pantoprazole (PROTONIX) 40 MG tablet Take 1 tablet 30-60 min before breakfast. 09/08/18  Yes Levin Erp, [place]    Physical Exam: Vitals:   09/16/18 1500 09/16/18 1530 09/16/18 1600 09/16/18 1630  BP: 95/62 (!) 97/57 (!) 111/50 (!) 97/55  Pulse: 83 82 87 75  Resp: 18 (!) 22 (!) 21 16  Temp:  (!) 97.3 F (36.3 C) (!) 97.3  F (36.3 C)   TempSrc:  Oral Oral   SpO2: 99% 98% 97% 96%  Weight:   89.2 kg   Height:   5\' 9"  (1.753 m)    Constitutional: Chronically ill-appearing male in no distress, calm demeanor Eyes: Scleral icterus noted, otherwise lids and conjunctivae normal, PERRL ENMT: Mucous membranes are tacky. Posterior pharynx clear of any exudate or lesions. Poor dentition.  Neck: normal, supple, no masses, no thyromegaly Respiratory: Non-labored breathing room air without accessory  muscle use. Clear breath sounds to auscultation bilaterally Cardiovascular: Regular rate and rhythm, no murmurs, rubs, or gallops. No carotid bruits. No JVD. 3+ pitting LE edema. Palpable pedal pulses. Abdomen: Normoactive bowel sounds. RUQ tenderness noted, though non-distended, and no masses palpated. No hepatosplenomegaly. GU: No indwelling catheter Musculoskeletal: No clubbing / cyanosis. No joint deformity upper and lower extremities. Good ROM, no contractures. Normal muscle tone.  Skin: Warm, dry. Likely jaundiced. No rashes, wounds, or ulcers. Neurologic: Very hard of hearing, otherwise CN II-XII grossly intact. Gait not assessed as he's diffusely very weak. Speech normal. No focal deficits in motor strength or sensation in all extremities.  Psychiatric: Alert and oriented x3. Normal judgment and insight. Mood euthymic with congruent affect.   Labs on Admission: I have personally reviewed following labs and imaging studies  CBC: Recent Labs  Lab 09/16/18 1038  WBC 12.5*  NEUTROABS 10.1*  HGB 10.0*  HCT 29.5*  MCV 67.0*  PLT 292   Basic Metabolic Panel: Recent Labs  Lab 09/16/18 1038  NA 137  K 3.5  CL 99  CO2 18*  GLUCOSE 104*  BUN 41*  CREATININE 2.75*  CALCIUM 8.7*   Liver Function Tests: Recent Labs  Lab 09/16/18 1038  AST 233*  ALT 74*  ALKPHOS 384*  BILITOT 13.0*  PROT 7.5  ALBUMIN 2.0*   Recent Labs  Lab 09/16/18 1038  LIPASE 40   Recent Labs  Lab 09/16/18 1158  AMMONIA 44*   Coagulation Profile: Recent Labs  Lab 09/16/18 1036  INR 1.48   Urine analysis:    Component Value Date/Time   COLORURINE RED (A) 03/09/2017 1018   APPEARANCEUR TURBID (A) 03/09/2017 1018   LABSPEC  03/09/2017 1018    TEST NOT REPORTED DUE TO COLOR INTERFERENCE OF URINE PIGMENT   PHURINE  03/09/2017 1018    TEST NOT REPORTED DUE TO COLOR INTERFERENCE OF URINE PIGMENT   GLUCOSEU (A) 03/09/2017 1018    TEST NOT REPORTED DUE TO COLOR INTERFERENCE OF URINE PIGMENT    HGBUR (A) 03/09/2017 1018    TEST NOT REPORTED DUE TO COLOR INTERFERENCE OF URINE PIGMENT   BILIRUBINUR (A) 03/09/2017 1018    TEST NOT REPORTED DUE TO COLOR INTERFERENCE OF URINE PIGMENT   KETONESUR (A) 03/09/2017 1018    TEST NOT REPORTED DUE TO COLOR INTERFERENCE OF URINE PIGMENT   PROTEINUR (A) 03/09/2017 1018    TEST NOT REPORTED DUE TO COLOR INTERFERENCE OF URINE PIGMENT   UROBILINOGEN >8.0 (H) 06/22/2011 2011   NITRITE (A) 03/09/2017 1018    TEST NOT REPORTED DUE TO COLOR INTERFERENCE OF URINE PIGMENT   LEUKOCYTESUR (A) 03/09/2017 1018    TEST NOT REPORTED DUE TO COLOR INTERFERENCE OF URINE PIGMENT    No results found for this or any previous visit (from the past 240 hour(s)).   Radiological Exams on Admission: Ct Abdomen Pelvis Wo Contrast  Result Date: 09/16/2018 CLINICAL DATA:  Abdominal distention. Metastatic prostate cancer. EXAM: CT ABDOMEN AND PELVIS WITHOUT CONTRAST TECHNIQUE: Multidetector  CT imaging of the abdomen and pelvis was performed following the standard protocol without IV contrast. COMPARISON:  CT scan dated 03/09/2017 FINDINGS: Lower chest: On image number 1 there is an enlarged subcarinal lymph node, 18 mm in diameter. Heart size is normal. Minimal pericardial effusion. Minimal linear scarring at the lung bases. No effusions. Hepatobiliary: New extensive metastatic disease throughout the liver with new hepatomegaly due to the metastatic disease. Cholelithiasis. No dilated bile ducts. Pancreas: Unremarkable. No pancreatic ductal dilatation or surrounding inflammatory changes. Spleen: New ill-defined 3.2 cm lesion in the posterior aspect of the spleen. Adrenals/Urinary Tract: Adrenal glands are normal. No hydronephrosis. 16 mm partially calcified lesion in the mid right kidney which appears new since the prior study. Massive enlargement of the prostate gland extending into the bladder. Prostate gland measures approximately 8 cm in diameter and fills most of the  bladder. Stomach/Bowel: There is a lobulated annular mass involving the mid sigmoid portion of the colon best seen on image 61 of series 2. No obstruction. Moderate hiatal hernia. Small bowel appears normal. Appendix is normal. Vascular/Lymphatic: Aortic atherosclerosis. No discrete adenopathy. Reproductive: Massive enlargement of the prostate gland filling most of the bladder. Other: There is a small amount of ascites adjacent to the right lobe of the liver and in the right pericolic gutter extending into the right side of the pelvis. Anasarca. Musculoskeletal: There are extensive blastic and lytic lesions throughout the visualized portion of the skeleton consistent with metastatic prostate cancer. The overall appearance of the metastatic disease is significantly improved since the prior study. IMPRESSION: 1. New extensive metastatic disease throughout the liver creating hepatomegaly. New ill-defined 3.2 cm lesion in the posterior aspect of the spleen. New subcarinal adenopathy. 2. New annular mass in the mid sigmoid portion of the colon without obstruction. 3. Massive enlargement of the prostate gland. 4. Extensive blastic and lytic metastatic disease throughout the visualized portion of the skeleton from a previously demonstrated metastatic prostate cancer, significantly improved since the prior study. 5. 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 Alternatively, this could represent atypical metastatic prostate cancer. Electronically Signed   By: Lorriane Shire M.D.   On: 09/16/2018 13:00   Dg Chest 2 View  Result Date: 09/16/2018 CLINICAL DATA:  "getting weaker and weaker". He has had a recent chemo. treatment for prostate cancer. He reported he had fallen at home, but has no injuries. He stated he was too weak to get up after the fall and he lied on the floor for approximately 2 hours until he was able to  contact someone EXAM: CHEST - 2 VIEW COMPARISON:  none FINDINGS: Lungs are clear. Heart size and mediastinal contours are within normal limits. No effusion. Visualized bones unremarkable. IMPRESSION: No acute cardiopulmonary disease. Electronically Signed   By: Lucrezia Europe M.D.   On: 09/16/2018 11:33   Ct Head Wo Contrast  Result Date: 09/16/2018 CLINICAL DATA:  77 year old with head trauma. Receiving treatment for prostate cancer. Recent fall. EXAM: CT HEAD WITHOUT CONTRAST TECHNIQUE: Contiguous axial images were obtained from the base of the skull through the vertex without intravenous contrast. COMPARISON:  None. FINDINGS: Brain: No evidence for acute hemorrhage, mass lesion, midline shift, hydrocephalus or large infarct. Small amount of low density in the periventricular white matter. Mild cerebral atrophy. Vascular: No hyperdense vessel or unexpected calcification. Skull: Negative for fracture. Focal lucency involving the medial right mandibular condyle is nonspecific.  No evidence for bone destruction involving the visualized right mandibular condyle. Sinuses/Orbits: No acute finding. Other: None. IMPRESSION: 1. No acute intracranial abnormality. 2. Mild atrophy. Mild white matter disease suggestive for chronic small vessel ischemic changes. Electronically Signed   By: Markus Daft M.D.   On: 09/16/2018 12:59   Ct L-spine No Charge  Result Date: 09/16/2018 CLINICAL DATA:  Pain secondary to a fall. Metastatic prostate cancer. Weakness. EXAM: CT LUMBAR SPINE WITHOUT CONTRAST TECHNIQUE: Multidetector CT imaging of the lumbar spine was performed without intravenous contrast administration. Multiplanar CT image reconstructions were also generated. COMPARISON:  CT scan dated 03/09/2017 FINDINGS: Segmentation: 5 lumbar type vertebrae. Alignment: Normal. Vertebrae: Diffuse metastatic disease throughout the entire visualized portion of the spine. There are multiple lytic and blastic lesions in the spine. However,  there has been improvement in the lytic lesions since the prior CT scan of 03/09/2017. There is an old pathologic fracture of the right side of the T12 vertebral body. No discrete definable tumor within the spinal canal. There blastic lesions in the visualized portions of the iliac bones. Paraspinal and other soft tissues: Negative. Disc levels: T11-12: Disc space narrowing. No disc bulging or protrusion. T12-L1: Disc space narrowing. No disc bulging or protrusion. L1-2: No disc bulging or protrusion. L2-3: Slight protrusion of the posterior inferior aspect of the L2 vertebral endplate into the spinal canal with only minimal indentation upon the thecal sac. No focal neural impingement. L3-4: Tiny broad-based disc bulge with slight narrowing of the spinal canal symmetrically. L4-5: Broad-based disc protrusion. Hypertrophy of the ligamentum flavum and facet joints combine to create severe spinal stenosis. Moderately severe bilateral facet arthritis. L5-S1: Small broad-based disc bulge. Severe bilateral facet arthritis and ligamentum flavum hypertrophy with moderate to severe spinal stenosis. IMPRESSION: 1. No acute abnormality of the lumbar spine. 2. Extensive blastic and lytic metastatic disease throughout the visualized portion of the spine and pelvic bones, improved slightly since the prior study of 03/09/2017. 3. 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 4. Moderate to severe spinal stenosis at L5-S1. Electronically Signed   By: Lorriane Shire M.D.   On: 09/16/2018 12:48   US Abdomen Limited Ruq  Result Date: 09/16/2018 CLINICAL DATA:  Acute RIGHT UPPER QUADRANT abdominal pain. EXAM: ULTRASOUND ABDOMEN LIMITED RIGHT UPPER QUADRANT COMPARISON:  CT abdomen and pelvis performed earlier same day and on 03/09/2017 are correlated. FINDINGS: Gallbladder: Shadowing gallstones within the contracted gallbladder, the largest measuring approximately 1.1 cm. No gallbladder wall thickening or pericholecystic fluid. Negative  sonographic Murphy's sign according to the ultrasound technologist. Common bile duct: Diameter: Approximately 8 mm centrally. Obscured distally by duodenal bowel gas. No visible bile duct stone. Liver: Multiple heterogeneous masses of mixed echotexture, indicating innumerable liver metastases as noted on the earlier CT. Portal vein is patent on color Doppler imaging with normal direction of blood flow towards the liver. IMPRESSION: 1. Cholelithiasis without sonographic evidence of cholecystitis. 2. No biliary ductal dilation. 3. Innumerable liver metastases as noted on the CT earlier same day. Electronically Signed   By: Evangeline Dakin M.D.   On: 09/16/2018 13:29    EKG: Independently reviewed. NSR w/RBBB present in 2018. QTc about 541msec. No ischemic changes.  Assessment/Plan Active Problems:   Lactic acidosis   Lactic acidosis, LFT elevations: Due to hepatic metastases. Lactic acid not clearing with IV fluids, though BP improved. Suspect liver metabolism not converting lactate to pyruvate. INR up and albumin down indicating impaired synthetic function. No biliary obstruction on CT or U/S. - Continue  zosyn. With leukocytosis, will cover for infection ?cholangitis, though this is less likely than dehydration/intravascular depletion leading to hypoperfusion. - Suspect hypoalbuminemia contributing to third spacing fluids. Will give empiric albumin.   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 Pt has not decided what extent of treatment he would want if indicated.    Acute renal failure: Doubt postrenal obstruction despite massive (8cm) prostate extending to the bladder with no hydronephrosis on CT. Suspect dehydration/prerenal +/- ischemic ATN due to hypoperfusion. - IV fluids (30cc/kg given) - Monitor I/O strictly - Check CK - Avoid nephrotoxins, will not continue vancomycin for now.   Troponin elevation: No ischemic  ECG changes or chest pain.  - Trend cardiac enzymes  Weight loss, dysphagia: Likely widespread malignancy is contributing, though ?if esophageal etiology also at play.  - SLP evaluation, may benefit from barium swallow.  - GI consulted as above.   Prostate CA:  - Follow up with Dr. Alen Blew.   GERD: Presumptive Dx.  - Continue PPI  DVT prophylaxis: Heparin  Code Status: Full, confirmed at admission. Asked the patient to have neighbor bring hearing aids in so further goals of care could be discussed. He has been DNR in the past, but clearly states he would want resuscitative efforts at this time.   Family Communication: None at bedside, declined offer to call. Disposition Plan: Uncertain Consults called: GI, Dr. Collene Mares who will evaluate the patient this evening or 12/29. Also discussed with oncology, Dr. Julien Nordmann. Will also place palliative care consultation.  Admission status: Inpatient    Patrecia Pour, MD [facility] [email or web address] Password [facility] 09/16/2018, 4:46 PM

## 2018-09-17 ENCOUNTER — Inpatient Hospital Stay (HOSPITAL_COMMUNITY): Payer: Medicare PPO

## 2018-09-17 DIAGNOSIS — E872 Acidosis: Secondary | ICD-10-CM

## 2018-09-17 DIAGNOSIS — Z7189 Other specified counseling: Secondary | ICD-10-CM

## 2018-09-17 DIAGNOSIS — Z515 Encounter for palliative care: Secondary | ICD-10-CM

## 2018-09-17 DIAGNOSIS — R0609 Other forms of dyspnea: Secondary | ICD-10-CM

## 2018-09-17 DIAGNOSIS — R6521 Severe sepsis with septic shock: Secondary | ICD-10-CM

## 2018-09-17 DIAGNOSIS — E8809 Other disorders of plasma-protein metabolism, not elsewhere classified: Secondary | ICD-10-CM

## 2018-09-17 DIAGNOSIS — N179 Acute kidney failure, unspecified: Secondary | ICD-10-CM

## 2018-09-17 DIAGNOSIS — A419 Sepsis, unspecified organism: Principal | ICD-10-CM

## 2018-09-17 DIAGNOSIS — D72829 Elevated white blood cell count, unspecified: Secondary | ICD-10-CM

## 2018-09-17 LAB — CBC
HEMATOCRIT: 21.5 % — AB (ref 39.0–52.0)
Hemoglobin: 7.7 g/dL — ABNORMAL LOW (ref 13.0–17.0)
MCH: 22.7 pg — ABNORMAL LOW (ref 26.0–34.0)
MCHC: 35.8 g/dL (ref 30.0–36.0)
MCV: 63.4 fL — ABNORMAL LOW (ref 80.0–100.0)
Platelets: 152 10*3/uL (ref 150–400)
RBC: 3.39 MIL/uL — ABNORMAL LOW (ref 4.22–5.81)
RDW: 22.1 % — ABNORMAL HIGH (ref 11.5–15.5)
WBC: 12.7 10*3/uL — ABNORMAL HIGH (ref 4.0–10.5)
nRBC: 0.3 % — ABNORMAL HIGH (ref 0.0–0.2)

## 2018-09-17 LAB — COMPREHENSIVE METABOLIC PANEL
ALT: 56 U/L — ABNORMAL HIGH (ref 0–44)
AST: 174 U/L — ABNORMAL HIGH (ref 15–41)
Albumin: 1.5 g/dL — ABNORMAL LOW (ref 3.5–5.0)
Alkaline Phosphatase: 260 U/L — ABNORMAL HIGH (ref 38–126)
Anion gap: 13 (ref 5–15)
BILIRUBIN TOTAL: 8.7 mg/dL — AB (ref 0.3–1.2)
BUN: 43 mg/dL — ABNORMAL HIGH (ref 8–23)
CO2: 21 mmol/L — ABNORMAL LOW (ref 22–32)
Calcium: 7.8 mg/dL — ABNORMAL LOW (ref 8.9–10.3)
Chloride: 103 mmol/L (ref 98–111)
Creatinine, Ser: 2.95 mg/dL — ABNORMAL HIGH (ref 0.61–1.24)
GFR calc Af Amer: 23 mL/min — ABNORMAL LOW (ref >60)
GFR, EST NON AFRICAN AMERICAN: 20 mL/min — AB (ref >60)
Glucose, Bld: 89 mg/dL (ref 70–99)
Potassium: 4.3 mmol/L (ref 3.5–5.1)
Sodium: 137 mmol/L (ref 135–145)
TOTAL PROTEIN: 5.4 g/dL — AB (ref 6.5–8.1)

## 2018-09-17 LAB — URINALYSIS, ROUTINE W REFLEX MICROSCOPIC
Glucose, UA: 100 mg/dL — AB
Nitrite: POSITIVE — AB
Protein, ur: >300 mg/dL — AB
Specific Gravity, Urine: 1.025 (ref 1.005–1.030)
pH: 5 (ref 5.0–8.0)

## 2018-09-17 LAB — ECHOCARDIOGRAM COMPLETE
Height: 69 in
Weight: 3146.41 oz

## 2018-09-17 LAB — URINALYSIS, MICROSCOPIC (REFLEX)

## 2018-09-17 LAB — TROPONIN I
Troponin I: 0.03 ng/mL (ref <0.03)
Troponin I: 0.03 ng/mL (ref <0.03)

## 2018-09-17 LAB — PROCALCITONIN: Procalcitonin: 13.17 ng/mL

## 2018-09-17 MED ORDER — LACTATED RINGERS IV SOLN
INTRAVENOUS | Status: DC
  Administered 2018-09-17 – 2018-09-21 (×4): via INTRAVENOUS

## 2018-09-17 MED ORDER — VANCOMYCIN HCL 10 G IV SOLR
1500.0000 mg | Freq: Once | INTRAVENOUS | Status: AC
  Administered 2018-09-17: 1500 mg via INTRAVENOUS
  Filled 2018-09-17: qty 1500

## 2018-09-17 MED ORDER — LACTATED RINGERS IV BOLUS
1000.0000 mL | Freq: Once | INTRAVENOUS | Status: AC
  Administered 2018-09-17: 1000 mL via INTRAVENOUS

## 2018-09-17 MED ORDER — NOREPINEPHRINE 4 MG/250ML-% IV SOLN
0.0000 ug/min | INTRAVENOUS | Status: DC
  Administered 2018-09-17 – 2018-09-19 (×2): 2 ug/min via INTRAVENOUS
  Filled 2018-09-17 (×3): qty 250

## 2018-09-17 MED ORDER — SODIUM CHLORIDE 0.9 % IV SOLN
250.0000 mL | INTRAVENOUS | Status: DC
  Administered 2018-09-17 – 2018-09-22 (×2): 250 mL via INTRAVENOUS

## 2018-09-17 MED ORDER — HYDROCORTISONE NA SUCCINATE PF 100 MG IJ SOLR
50.0000 mg | Freq: Four times a day (QID) | INTRAMUSCULAR | Status: DC
  Administered 2018-09-17 – 2018-09-18 (×3): 50 mg via INTRAVENOUS
  Filled 2018-09-17 (×3): qty 2

## 2018-09-17 MED ORDER — SODIUM CHLORIDE 0.9 % IV BOLUS
1000.0000 mL | Freq: Once | INTRAVENOUS | Status: AC
  Administered 2018-09-17: 1000 mL via INTRAVENOUS

## 2018-09-17 MED ORDER — NOREPINEPHRINE 4 MG/250ML-% IV SOLN: 0.0000 ug/min | INTRAVENOUS | Status: DC

## 2018-09-17 MED ORDER — SODIUM CHLORIDE 0.9 % IV SOLN
INTRAVENOUS | Status: DC
  Administered 2018-09-17 – 2018-09-19 (×5): via INTRAVENOUS

## 2018-09-17 NOTE — Consult Note (Signed)
Consultation Note Date: 09/17/2018   Patient Name: Gregory Chaney  DOB: 11/22/1940  MRN: [id number]  Age / Sex: 77 y.o., male  PCP: [name], NP Referring Physician: Aline August, MD  Reason for Consultation: Establishing goals of care  HPI/Patient Profile: 77 y.o. male  admitted on 09/16/2018   Clinical Assessment and Goals of Care:  77 year old gentleman who lives alone, has past medical history of metastatic prostate cancer, admitted with abdominal pain, imaging concerning for metastatic colon adenocarcinoma.  The patient was noted to have falls, possible syncopal episodes, worsening jaundice at home. The patient is also noted to have lactic acidosis, acute kidney injury, worsening liver function tests, elevated WBC count. The patient remains admitted to stepdown unit at [facility] with acute renal failure, sepsis, dysphagia, low blood pressures, low urine output. He has been seen and evaluated by medical oncology. It is noted that the patient has multiple co morbidities and his prognosis appears limited. The patient has cholelithiasis and is deemed not a good candidate for cholecystectomy.Patient remains on antibiotics and sepsis protocol in the intensive care unit.  Palliative care consultation for CODE STATUS and goals of care discussions.  Gregory Chaney is very hard of hearing. He complains of abdominal discomfort, complaints of generalized body pain. It is noted that he has endorsed full code/full scope earlier. There is no family present at the bedside. Patient states that he has family in [place].  Call placed and discussed with great-granddaughter Gregory Chaney at [phone]. She states that the patient's entire family is in [place]. The patient has a granddaughter by the name of Gregory Chaney who lives in [place] and a who is the patient's next of kin. The patient elects for Gregory Chaney to  be his Scientist, research (medical).Her number is [phone]. She is reportedly on her way from [place].  Call placed and discussed with Gregory Chaney as well. I discussed frankly but compassionately with her about multiple serious illnesses the patient faces. Discussed frankly that the patient's prognosis appears markedly limited at this time, regardless of interventions being employed at this time. Patient remains at high risk for ongoing decline decompensation and even death. Discussed frankly that there is a high probability of the patient not surviving this hospitalization, even if he did, he might qualify for hospice services in the near future.  NEXT OF KIN Son Gregory Chaney who is reportedly not involved in the patient's life. He lives in [place].  The patient states that his grand daughter (whom he has raised as his daughter) Gregory Chaney [phone]   SUMMARY OF RECOMMENDATIONS    Patient to remain full code for now, at least until family can get here from [place]. Patient to remain full scope for now. Medical oncology to follow along. Continue current mode of care. Palliative medicine team will also continue to follow along, help guide further decision-making and help address further goals of care and disposition planning. Thank you for the consult.  Code Status/Advance Care Planning:  Full code    Symptom  Management:     continue current mode of care Palliative Prophylaxis:   Delirium Protocol  Additional Recommendations (Limitations, Scope, Preferences):  Full Scope Treatment  Psycho-social/Spiritual:   Desire for further Chaplaincy support:yes  Additional Recommendations: Caregiving  Support/Resources  Prognosis:   Unable to determine  Discharge Planning: To Be Determined      Primary Diagnoses: Present on Admission: . Lactic acidosis . Bone metastases ([place]) . 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 Colonic mass .  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 Elevated troponin . Acute renal failure ([place]) . Hyperbilirubinemia   I have reviewed the medical record, interviewed the patient and family, and examined the patient. The following aspects are pertinent.  Past Medical History:  Diagnosis Date  . History of radiation therapy 11/01/16- 11/12/16   Lower spine L3- S4/ 30 Gy in 10 fractions  . Obesity   . Prostate cancer ([place]) 1995   Social History   Socioeconomic History  . Marital status: Widowed    Spouse name: Not on file  . Number of children: 1  . Years of education: Not on file  . Highest education level: Not on file  Occupational History  . Occupation: retired  Scientific laboratory technician  . Financial resource strain: Not on file  . Food insecurity:    Worry: Not on file    Inability: Not on file  . Transportation needs:    Medical: Not on file    Non-medical: Not on file  Tobacco Use  . Smoking status: Never Smoker  . Smokeless tobacco: Never Used  Substance and Sexual Activity  . Alcohol use: No  . Drug use: No  . Sexual activity: Not Currently  Lifestyle  . Physical activity:    Days per week: Not on file    Minutes per session: Not on file  . Stress: Not on file  Relationships  . Social connections:    Talks on phone: Not on file    Gets together: Not on file    Attends religious service: Not on file    Active member of club or organization: Not on file    Attends meetings of clubs or organizations: Not on file    Relationship status: Not on file  Other Topics Concern  . Not on file  Social History Narrative  . Not on file   Family History  Problem Relation Age of Onset  . Colon cancer Neg Hx    Scheduled Meds: . heparin  5,000 Units Subcutaneous Q8H  . pantoprazole  40 mg Oral Daily  . sodium chloride flush  3 mL Intravenous Q12H   Continuous Infusions: . sodium chloride 125 mL/hr at 09/17/18 0758  . lactated ringers 100 mL/hr at 09/17/18 0230  . piperacillin-tazobactam (ZOSYN)   IV Stopped (09/17/18 1008)   PRN Meds:.HYDROmorphone, ondansetron **OR** ondansetron (ZOFRAN) IV Medications Prior to Admission:  Prior to Admission medications   Medication Sig Start Date End Date Taking? Authorizing Provider  pantoprazole (PROTONIX) 40 MG tablet Take 1 tablet 30-60 min before breakfast. 09/08/18  Yes Levin Erp, PA   Allergies  Allergen Reactions  . Cephalexin Other (See Comments)    hallucinations  . Hydrocodone Other (See Comments)    Confusion,my mind doesn't work well   . Morphine Other (See Comments)     Confusion. My mind does not work right  . Flomax [Tamsulosin Hcl] Other (See Comments)    My mind doesn't work well  . Percocet [Oxycodone-Acetaminophen] Nausea Only  Review of Systems Abdominal pain Generalized pain Physical Exam Awake alert, resting in bed Next extremely hard of hearing Mild distress secondary to abdominal pain Diminished breath sounds S1-S2 Left lower quadrant abdominal tenderness Has edema  Vital Signs: BP (!) 114/54 (BP Location: Left Arm)   Pulse 80   Temp 97.6 F (36.4 C) (Oral)   Resp 16   Ht 5\' 9"  (1.753 m)   Wt 89.2 kg   SpO2 96%   BMI 29.04 kg/m  Pain Scale: 0-10   Pain Score: 10-Worst pain ever   SpO2: SpO2: 96 % O2 Device:SpO2: 96 % O2 Flow Rate: .   IO: Intake/output summary:   Intake/Output Summary (Last 24 hours) at 09/17/2018 1333 Last data filed at 09/17/2018 0902 Gross per 24 hour  Intake 1438.07 ml  Output 25 ml  Net 1413.07 ml    LBM: Last BM Date: 09/15/18 Baseline Weight: Weight: 89.2 kg Most recent weight: Weight: 89.2 kg     Palliative Assessment/Data:   PPS 30%  Time In:  11 Time Out:  12.10   Time Total:   70 min  Greater than 50%  of this time was spent counseling and coordinating care related to the above assessment and plan.  Signed by: Loistine Chance, MD  [phone] Please contact Palliative Medicine Team phone at [phone] for questions and concerns.  For  individual provider: See Shea Evans

## 2018-09-17 NOTE — Progress Notes (Signed)
Patient BP low 82/54  Dr. Starla Link on unit and notified during. New orders received

## 2018-09-17 NOTE — Progress Notes (Signed)
  Echocardiogram 2D Echocardiogram has been performed.  Gregory Chaney 09/17/2018, 12:14 PM

## 2018-09-17 NOTE — Progress Notes (Addendum)
Writer unable to administer  Levophed (not within scope) Discussed  Dr. Lindalou Hose  plan of care with charge nurse  Mickie Bail, RN/ Leonie Man, RN. Charge nurse will implement plan of care and start and monitor levophed. BP currently 96/50 Map 65

## 2018-09-17 NOTE — Progress Notes (Signed)
Patient ID: Riggins Cisek, male   DOB: 1941-07-14, 77 y.o.   MRN: [id number] I was called by the nurse that the patient's blood pressures in the 80s.  Patient has received almost 6 L of IV fluids since admission.  He has very minimal urine output after placement of Foley's catheter this morning.  Patient is a full code currently.  Seen and examined the patient at bedside.  Patient is sleepy, wakes up on calling her name, does not answer much questions but he is maintaining airway and breathing at 15.  His blood pressure intermittently has been in the 80s.  He is currently on broad-spectrum antibiotics.  I have spoken to Dr. Alma Downs who recommended to start Levophed through peripheral IV.  Patient will be evaluated by PCCM at some point.  If respiratory status worsens, if patient is unable to maintain airway then patient will need to be intubated.  Start Levophed.  Continue IV fluids.  Overall prognosis is guarded to poor.

## 2018-09-17 NOTE — Consult Note (Signed)
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  In the ED the patient was noted to be jaundiced and in ARF.  Patient was admitted and a CT of the abdomen/pelvis was done that revealed diffuse liver mets and LFTs were elevated.  Patient was given a total of 6 liters of IVF but remains hypotensive and PCCM was consulted for septic shock of unknown source.  CXR that I reviewed myself reveals no infiltrate.  Urine with nitrites and WBC but no WBC on microscopy.  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  In the ED the patient was noted to be jaundiced and in ARF.  Patient was admitted and a CT of the abdomen/pelvis was done that revealed diffuse liver mets and LFTs were elevated.  Patient was given a total of 6 liters of IVF but remains hypotensive and PCCM was consulted for septic shock of unknown source.  CXR that I reviewed myself reveals no infiltrate.  Urine with nitrites and WBC but no WBC on microscopy.  Past Medical History  Widely metastatic prostate cancer now with liver and renal failure and circulatory shock unknown cause.  Significant Hospital Events   12/29 transfer to the ICU for presumed septic shock  Consults:  PCCM Palliative care  Procedures:  None  Significant Diagnostic Tests:  CT with widely metastatic disease 12/29  Micro Data:  Blood 12/28>>>NTD Urine 12/28>>>NTD MRSA PCR 12/28 negative  Antimicrobials:  Zosyn 12/28>>> Vancomycin 12/29>>>   Interim history/subjective:  No new complaints but is  more lethargic and hypotensive  Objective   Blood pressure (!) 96/50, pulse 72, temperature 97.7 F (36.5 C), temperature source Oral, resp. rate 19, height 5\' 9"  (1.753 m), weight 89.2 kg, SpO2 92 %.        Intake/Output Summary (Last 24 hours) at 09/17/2018 1822 Last data filed at 09/17/2018 1713 Gross per 24 hour  Intake 2420.4 ml  Output 40 ml  Net 2380.4 ml   Filed Weights   09/16/18 1600  Weight: 89.2 kg    Examination: General: Chronically ill appearing male, NAD HENT: [place]/AT, PERRL, EOM-I and MMM Lungs: CTA bilaterally Cardiovascular: RRR, Nl S1/S2 and -M/R/G Abdomen: Soft, diffusely tender, distended, fluid wave and +BS Extremities: 2+ edema and -tenderness Neuro: Alert and interactive, very hard of hearing, moving all ext to command Skin: Intact  Resolved Hospital Problem list   N/A  Assessment & Plan:  77 year old male with PMH of widely metastatic prostate cancer who presents to PCCM with circulatory shock, unknown cause.  Discussed with PCCM-NP.  Shock: resumed septic, unsure if adrenal insufficiency given widely spread mets and unknown medications at home.  - Add vancomycin   - Levophed peripheral protocol  - F/U on culture  - Check PCT  - Cortisol level  - Stress dose steroids  Lactic acidosis: with renal and liver failure, not surprising  - Hydrate but decrease rate given diffuse edema now  - BP control  as above  AMS:  - Avoid all sedative  - If more of a palliative approach will liberalize narcotic use  GOC: patient wishes to be full code, family arriving in AM, will need further discussion as intubation and resuscitation in this case will be ineffective given widely spread cancer.  PCCM will continue to follow.  Labs   CBC: Recent Labs  Lab 09/16/18 1038 09/17/18 0518  WBC 12.5* 12.7*  NEUTROABS 10.1*  --   HGB 10.0* 7.7*  HCT 29.5* 21.5*  MCV 67.0* 63.4*  PLT 214 382    Basic Metabolic Panel: Recent Labs  Lab 09/16/18 1038  09/17/18 0518  NA 137 137  K 3.5 4.3  CL 99 103  CO2 18* 21*  GLUCOSE 104* 89  BUN 41* 43*  CREATININE 2.75* 2.95*  CALCIUM 8.7* 7.8*   GFR: Estimated Creatinine Clearance: 23.5 mL/min (A) (by C-G formula based on SCr of 2.95 mg/dL (H)). Recent Labs  Lab 09/16/18 1038 09/16/18 1041 09/16/18 1237 09/17/18 0518  WBC 12.5*  --   --  12.7*  LATICACIDVEN  --  7.76* 8.08*  --     Liver Function Tests: Recent Labs  Lab 09/16/18 1038 09/17/18 0518  AST 233* 174*  ALT 74* 56*  ALKPHOS 384* 260*  BILITOT 13.0* 8.7*  PROT 7.5 5.4*  ALBUMIN 2.0* 1.5*   Recent Labs  Lab 09/16/18 1038  LIPASE 40   Recent Labs  Lab 09/16/18 1158  AMMONIA 44*    ABG    Component Value Date/Time   TCO2 25 10/23/2016 2119     Coagulation Profile: Recent Labs  Lab 09/16/18 1036  INR 1.48    Cardiac Enzymes: Recent Labs  Lab 09/16/18 1704 09/16/18 2326 09/17/18 0518  CKTOTAL 1,838*  --   --   TROPONINI 0.04* 0.03* 0.03*    HbA1C: No results found for: HGBA1C  CBG: No results for input(s): GLUCAP in the last 168 hours.  Review of Systems:     Past Medical History  He,  has a past medical history of History of radiation therapy (11/01/16- 11/12/16), Obesity, and Prostate cancer ([place]) (1995).   Surgical History    Past Surgical History:  Procedure Laterality Date  . CYSTOSCOPY  06/13/2014   cystoscopy with transrectal ultrasound biopsy of prostate     Social History   reports that he has never smoked. He has never used smokeless tobacco. He reports that he does not drink alcohol or use drugs.   Family History   His family history is negative for Colon cancer.   Allergies Allergies  Allergen Reactions  . Cephalexin Other (See Comments)    hallucinations  . Hydrocodone Other (See Comments)    Confusion,my mind doesn't work well   . Morphine Other (See Comments)     Confusion. My mind does not work right  . Flomax [Tamsulosin Hcl] Other (See Comments)     My mind doesn't work well  . Percocet [Oxycodone-Acetaminophen] Nausea Only     Home Medications  Prior to Admission medications   Medication Sig Start Date End Date Taking? Authorizing Provider  pantoprazole (PROTONIX) 40 MG tablet Take 1 tablet 30-60 min before breakfast. 09/08/18  Yes Levin Erp, PA    The patient is critically ill with multiple organ systems failure and requires high complexity decision making for assessment and support, frequent evaluation and titration of therapies, application of advanced monitoring technologies and extensive interpretation of multiple databases.   Critical Care Time devoted  to patient care services described in this note is  31  Minutes. This time reflects time of care of this signee Dr Jennet Maduro. This critical care time does not reflect procedure time, or teaching time or supervisory time of PA/NP/Med student/Med Resident etc but could involve care discussion time.  Rush Farmer, M.D. [facility] Pulmonary/Critical Care Medicine. Pager: [phone]. After hours pager: [phone].

## 2018-09-17 NOTE — Evaluation (Signed)
Clinical/Bedside Swallow Evaluation Patient Details  Name: Gregory Chaney MRN: [id number] Date of Birth: 08/20/1941  Today's Date: 09/17/2018 Time: SLP Start Time (ACUTE ONLY): 1242 SLP Stop Time (ACUTE ONLY): 1256 SLP Time Calculation (min) (ACUTE ONLY): 14 min  Past Medical History:  Past Medical History:  Diagnosis Date  . History of radiation therapy 11/01/16- 11/12/16   Lower spine L3- S4/ 30 Gy in 10 fractions  . Obesity   . Prostate cancer ([place]) 1995   Past Surgical History:  Past Surgical History:  Procedure Laterality Date  . CYSTOSCOPY  06/13/2014   cystoscopy with transrectal ultrasound biopsy of prostate   HPI:  77 year old male with history of prostate cancer with bony metastasis status post Lupron and XRT to L3-L4 who presented on 09/16/2018 with fall at home and with weakness, weight loss, dysphagia.  He was found to be hypotensive and jaundiced in the ED along with lactic acidosis and acute kidney injury with worsening LFTs and elevated WBCs.  CT of the abdomen and pelvis showed extensive new metastasis throughout the liver, and the spleen and subcarinal lymph nodes along with new annular mid sigmoid mass.  Lytic and blastic bony metastasis were noted.  CT of the head was negative for acute abnormality. CXR clear.    Assessment / Plan / Recommendation Clinical Impression  Patient presents with a suspected primary esophageal dysphagia characterized by chest discomfort with solid food intake without indication of an oropharyngeal swallow or decreased airway protection. Provided patient with reflux precautions including upright posture during meals as well as for 30 minutes following to aid in esophageal clearance. Note that patient has an esophagram scheduled for 1/20 per recommendations of GI MD. Agree with this recommendations. No SLP f/u indicated.  SLP Visit Diagnosis: Dysphagia, unspecified (R13.10)    Aspiration Risk  Mild aspiration risk    Diet Recommendation  Regular;Thin liquid   Liquid Administration via: Cup;Straw Medication Administration: Whole meds with liquid Supervision: Patient able to self feed Compensations: Slow rate;Small sips/bites;Follow solids with liquid Postural Changes: Remain upright for at least 30 minutes after po intake;Seated upright at 90 degrees    Other  Recommendations Recommended Consults: Consider esophageal assessment Oral Care Recommendations: Oral care BID   Follow up Recommendations None        Swallow Study   General HPI: 77 year old male with history of prostate cancer with bony metastasis status post Lupron and XRT to L3-L4 who presented on 09/16/2018 with fall at home and with weakness, weight loss, dysphagia.  He was found to be hypotensive and jaundiced in the ED along with lactic acidosis and acute kidney injury with worsening LFTs and elevated WBCs.  CT of the abdomen and pelvis showed extensive new metastasis throughout the liver, and the spleen and subcarinal lymph nodes along with new annular mid sigmoid mass.  Lytic and blastic bony metastasis were noted.  CT of the head was negative for acute abnormality. CXR clear.  Type of Study: Bedside Swallow Evaluation Previous Swallow Assessment: none, c/o solid food dysphagia to GI MD, esophagram scheduled.  Diet Prior to this Study: NPO Temperature Spikes Noted: No Respiratory Status: Room air History of Recent Intubation: No Behavior/Cognition: Alert;Cooperative;Pleasant mood(HOH) Oral Cavity Assessment: Within Functional Limits Oral Care Completed by SLP: Recent completion by staff Vision: Functional for self-feeding Self-Feeding Abilities: Able to feed self Patient Positioning: Upright in bed Baseline Vocal Quality: Normal Volitional Cough: Cognitively unable to elicit Volitional Swallow: Able to elicit    Oral/Motor/Sensory Function Overall Oral Motor/Sensory Function:  Within functional limits   Ice Chips Ice chips: Not tested   Thin Liquid  Thin Liquid: Within functional limits Presentation: Self Fed;Straw    Nectar Thick Nectar Thick Liquid: Not tested   Honey Thick Honey Thick Liquid: Not tested   Puree Puree: Within functional limits Presentation: Spoon   Solid     Solid: Within functional limits Presentation: [place], [place] 09/17/2018,1:03 PM

## 2018-09-17 NOTE — Progress Notes (Signed)
Pharmacy Antibiotic Note  Gregory Chaney is a 77 y.o. male admitted on 09/16/2018 after falling at home, weakness. Found to have leukocytosis, lactic acidosis, and ARF (CrCl ~25). Known mets to the liver. Given Vanc and Zosyn x 1 in the ED. Pharmacy is asked to continue Zosyn for suspected     intra-abdominal infection/cholangitis.   09/17/18: Transferred to ICU for presumed septic shock. Pharmacy is asked to start Vancomycin. He received 1g IV on 09/16/18. SCr trended up.   Plan: Start Vancomycin 1.5g IV q24h for estimated AUC 486. (Goal AUC = 400 - 500) Measure Vanc peak and trough at steady state. Continue Zosyn 3.375g IV q8h (4 hour infusion).  Follow up renal function, culture results, and clinical course.   Height: 5\' 9"  (175.3 cm) Weight: 196 lb 10.4 oz (89.2 kg) IBW/kg (Calculated) : 70.7  Temp (24hrs), Avg:97.6 F (36.4 C), Min:97.5 F (36.4 C), Max:97.7 F (36.5 C)  Recent Labs  Lab 09/16/18 1038 09/16/18 1041 09/16/18 1237 09/17/18 0518  WBC 12.5*  --   --  12.7*  CREATININE 2.75*  --   --  2.95*  LATICACIDVEN  --  7.76* 8.08*  --     Estimated Creatinine Clearance: 23.5 mL/min (A) (by C-G formula based on SCr of 2.95 mg/dL (H)).    Allergies  Allergen Reactions  . Cephalexin Other (See Comments)    hallucinations  . Hydrocodone Other (See Comments)    Confusion,my mind doesn't work well   . Morphine Other (See Comments)     Confusion. My mind does not work right  . Flomax [Tamsulosin Hcl] Other (See Comments)    My mind doesn't work well  . Percocet [Oxycodone-Acetaminophen] Nausea Only   Antimicrobials this admission: 12/28 Vanc x1, 12/29 >> 12/28 Zosyn >>   Dose adjustments this admission: --  Microbiology results: 12/28 BCx: 12/28 UCx:  12/28 MRSA PCR: neg  Thank you for allowing pharmacy to be a part of this patient's care.  Romeo Rabon, PharmD. [place]: [phone]. 09/17/2018,7:00 PM.

## 2018-09-17 NOTE — Progress Notes (Signed)
Patient ID: Gregory Chaney, male   DOB: 04-28-1941, 77 y.o.   MRN: [id number]  PROGRESS NOTE    Gregory Chaney  KTG:[id number] DOB: Jan 20, 1941 DOA: 09/16/2018 PCP: [name], NP   Brief Narrative:  77 year old male with history of prostate cancer with bony metastasis status post Lupron and XRT to L3-L4 who presented on 09/16/2018 with fall at home and with weakness.  He was found to be hypotensive and jaundiced in the ED along with lactic acidosis and acute kidney injury with worsening LFTs and elevated WBCs.  CT of the abdomen and pelvis showed extensive new metastasis throughout the liver, and the spleen and subcarinal lymph nodes along with new annular mid sigmoid mass.  Lytic and blastic bony metastasis were noted.  CT of the head was negative for acute abnormality.  He was empirically started on antibiotics.  GI and oncology were consulted.   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 -Creatinine worsening.  As per the nursing staff, very little urine output since admission.  Will put in a Foley catheter.  IV fluid plan as below.  Avoid nephrotoxins  Question of sepsis with hypotension -Presented with lactic acidosis with elevated LFTs, acute renal failure and increased WBCs -No evidence of any infection but patient is persistently hypotensive despite having received more than 4 L of fluid since admission. -We will give 1 more liter of IV fluid bolus normal saline and subsequently continue on 125 cc an hour of normal saline.  If patient persistently remains hypotensive,  might need to start pressors and get critical care involved. -Continue Zosyn empirically.  Follow cultures.  Suspected metastatic colon cancer -GI consultation is pending.  Patient probably has a new diagnosis of colon cancer with metastases  -Oncology evaluation appreciated -Overall prognosis is guarded to poor considering current status.  Palliative care evaluation is pending.  Patient is full code.  Mild troponin elevation -No chest pain.  Troponins did not trend up.  No ischemic changes on EKG.  Will get 2D echo  Weight loss, dysphagia -Most likely from malignancy. -SLP evaluation. -GI evaluation pending  Microcytic anemia -Probably secondary to new diagnosis of cancer.  Monitor hemoglobin.  Transfuse if hemoglobin is less than 7  Elevated LFTs -Probably secondary to widespread liver metastases.  Monitor  Leukocytosis -Monitor  Hypoalbuminemia -Probably from poor oral intake and cancer.  Consult dietitian  Prostate cancer -Follows with Dr. Alen Blew as an outpatient   DVT prophylaxis: Heparin Code Status: Full.  Patient is very hard of hearing but confirms that he is a full code for now and he is the one who makes medical decisions for himself. Family Communication: None at bedside Disposition Plan: Depends on clinical outcome  Consultants: GI/oncology/palliative care  Procedures: None  Antimicrobials: Zosyn from 09/16/2018 onwards   Subjective: Patient seen and examined at bedside.  He is very hard of hearing and a poor historian.  He complains of pain all over, mostly on the left side of his abdomen and back.  Nursing staff reports very minimal urine output since admission.  No  overnight fever or vomiting.  Objective: Vitals:   09/17/18 0800 09/17/18 0803 09/17/18 0850 09/17/18 0900  BP: (!) 82/54 (!) 92/51 (!) 99/45 (!) 100/59  Pulse: 86 87 82 80  Resp: (!) 21 (!) 28 (!) 24 (!) 22  Temp: 97.6 F (36.4 C)     TempSrc: Oral     SpO2: 93% 95% 94% 96%  Weight:       Height:        Intake/Output Summary (Last 24 hours) at 09/17/2018 1012 Last data filed at 09/17/2018 0902 Gross per 24 hour  Intake 4661.13 ml  Output 25 ml  Net 4636.13 ml   Filed Weights   09/16/18 1600  Weight: 89.2 kg    Examination:  General exam: Appears ill.  Mild distress secondary to pain.  Poor historian.  Very hard of hearing Respiratory system: Bilateral decreased breath sounds at bases, tachypneic, scattered crackles Cardiovascular system: S1 & S2 heard, Rate controlled Gastrointestinal system: Abdomen is nondistended, soft and tender in the left side. Normal bowel sounds heard. Extremities: No cyanosis, clubbing, edema  Central nervous system: Awake, very hard of hearing, poor historian. No focal neurological deficits. Moving extremities Skin: No rashes, lesions or ulcers Psychiatry: Could not be assessed because of patient being a poor historian    Data Reviewed: I have personally reviewed following labs and imaging studies  CBC: Recent Labs  Lab 09/16/18 1038 09/17/18 0518  WBC 12.5* 12.7*  NEUTROABS 10.1*  --   HGB 10.0* 7.7*  HCT 29.5* 21.5*  MCV 67.0* 63.4*  PLT 214 630   Basic Metabolic Panel: Recent Labs  Lab 09/16/18 1038 09/17/18 0518  NA 137 137  K 3.5 4.3  CL 99 103  CO2 18* 21*  GLUCOSE 104* 89  BUN 41* 43*  CREATININE 2.75* 2.95*  CALCIUM 8.7* 7.8*   GFR: Estimated Creatinine Clearance: 23.5 mL/min (A) (by C-G formula based on SCr of 2.95 mg/dL (H)). Liver Function Tests: Recent Labs  Lab 09/16/18 1038 09/17/18 0518  AST 233* 174*  ALT 74* 56*  ALKPHOS 384* 260*  BILITOT 13.0* 8.7*  PROT 7.5 5.4*  ALBUMIN 2.0* 1.5*   Recent Labs  Lab 09/16/18 1038  LIPASE 40   Recent Labs  Lab 09/16/18 1158  AMMONIA 44*   Coagulation Profile: Recent Labs  Lab 09/16/18 1036  INR 1.48   Cardiac Enzymes: Recent Labs  Lab 09/16/18 1704 09/16/18 2326 09/17/18 0518  CKTOTAL 1,838*  --   --   TROPONINI 0.04* 0.03*  0.03*   BNP (last 3 results) No results for input(s): PROBNP in the last 8760 hours. HbA1C: No results for input(s): HGBA1C in the last 72 hours. CBG: No results for input(s): GLUCAP in the last 168 hours. Lipid Profile: No results for input(s): CHOL, HDL, LDLCALC, TRIG, CHOLHDL, LDLDIRECT in the last 72 hours. Thyroid Function Tests: No results for input(s): TSH, T4TOTAL, FREET4, T3FREE, THYROIDAB in the last 72 hours. Anemia Panel: No results for input(s): VITAMINB12, FOLATE, FERRITIN, TIBC, IRON, RETICCTPCT in the last 72 hours. Sepsis Labs: Recent Labs  Lab 09/16/18 1041 09/16/18 1237  LATICACIDVEN 7.76* 8.08*    Recent Results (from the past 240 hour(s))  MRSA PCR Screening     Status: None   Collection Time: 09/16/18  3:54 PM  Result Value Ref Range Status   MRSA by PCR NEGATIVE NEGATIVE Final    Comment:        The GeneXpert MRSA Assay (FDA approved for NASAL specimens only), is  one component of a comprehensive MRSA colonization surveillance program. It is not intended to diagnose MRSA infection nor to guide or monitor treatment for MRSA infections. Performed at [facility], [place] [address], [place], [place] [address]          Radiology Studies: Ct Abdomen Pelvis Wo Contrast  Result Date: 09/16/2018 CLINICAL DATA:  Abdominal distention. Metastatic prostate cancer. EXAM: CT ABDOMEN AND PELVIS WITHOUT CONTRAST TECHNIQUE: Multidetector CT imaging of the abdomen and pelvis was performed following the standard protocol without IV contrast. COMPARISON:  CT scan dated 03/09/2017 FINDINGS: Lower chest: On image number 1 there is an enlarged subcarinal lymph node, 18 mm in diameter. Heart size is normal. Minimal pericardial effusion. Minimal linear scarring at the lung bases. No effusions. Hepatobiliary: New extensive metastatic disease throughout the liver with new hepatomegaly due to the metastatic disease. Cholelithiasis. No dilated bile ducts.  Pancreas: Unremarkable. No pancreatic ductal dilatation or surrounding inflammatory changes. Spleen: New ill-defined 3.2 cm lesion in the posterior aspect of the spleen. Adrenals/Urinary Tract: Adrenal glands are normal. No hydronephrosis. 16 mm partially calcified lesion in the mid right kidney which appears new since the prior study. Massive enlargement of the prostate gland extending into the bladder. Prostate gland measures approximately 8 cm in diameter and fills most of the bladder. Stomach/Bowel: There is a lobulated annular mass involving the mid sigmoid portion of the colon best seen on image 61 of series 2. No obstruction. Moderate hiatal hernia. Small bowel appears normal. Appendix is normal. Vascular/Lymphatic: Aortic atherosclerosis. No discrete adenopathy. Reproductive: Massive enlargement of the prostate gland filling most of the bladder. Other: There is a small amount of ascites adjacent to the right lobe of the liver and in the right pericolic gutter extending into the right side of the pelvis. Anasarca. Musculoskeletal: There are extensive blastic and lytic lesions throughout the visualized portion of the skeleton consistent with metastatic prostate cancer. The overall appearance of the metastatic disease is significantly improved since the prior study. IMPRESSION: 1. New extensive metastatic disease throughout the liver creating hepatomegaly. New ill-defined 3.2 cm lesion in the posterior aspect of the spleen. New subcarinal adenopathy. 2. New annular mass in the mid sigmoid portion of the colon without obstruction. 3. Massive enlargement of the prostate gland. 4. Extensive blastic and lytic metastatic disease throughout the visualized portion of the skeleton from a previously demonstrated metastatic prostate cancer, significantly improved since the prior study. 5. The findings suggest that the patient has a new carcinoma of the distal colon with metastatic disease to the spleen, liver, and  mediastinum. Alternatively, this could represent atypical metastatic prostate cancer. Electronically Signed   By: Lorriane Shire M.D.   On: 09/16/2018 13:00   Dg Chest 2 View  Result Date: 09/16/2018 CLINICAL DATA:  "getting weaker and weaker". He has had a recent chemo. treatment for prostate cancer. He reported he had fallen at home, but has no injuries. He stated he was too weak to get up after the fall and he lied on the floor for approximately 2 hours until he was able to contact someone EXAM: CHEST - 2 VIEW COMPARISON:  none FINDINGS: Lungs are clear. Heart size and mediastinal contours are within normal limits. No effusion. Visualized bones unremarkable. IMPRESSION: No acute cardiopulmonary disease. Electronically Signed   By: Lucrezia Europe M.D.   On: 09/16/2018 11:33   Ct Head Wo Contrast  Result Date: 09/16/2018 CLINICAL DATA:  77 year old with head trauma. Receiving treatment for prostate cancer. Recent  fall. EXAM: CT HEAD WITHOUT CONTRAST TECHNIQUE: Contiguous axial images were obtained from the base of the skull through the vertex without intravenous contrast. COMPARISON:  None. FINDINGS: Brain: No evidence for acute hemorrhage, mass lesion, midline shift, hydrocephalus or large infarct. Small amount of low density in the periventricular white matter. Mild cerebral atrophy. Vascular: No hyperdense vessel or unexpected calcification. Skull: Negative for fracture. Focal lucency involving the medial right mandibular condyle is nonspecific. No evidence for bone destruction involving the visualized right mandibular condyle. Sinuses/Orbits: No acute finding. Other: None. IMPRESSION: 1. No acute intracranial abnormality. 2. Mild atrophy. Mild white matter disease suggestive for chronic small vessel ischemic changes. Electronically Signed   By: Markus Daft M.D.   On: 09/16/2018 12:59   Ct L-spine No Charge  Result Date: 09/16/2018 CLINICAL DATA:  Pain secondary to a fall. Metastatic prostate cancer.  Weakness. EXAM: CT LUMBAR SPINE WITHOUT CONTRAST TECHNIQUE: Multidetector CT imaging of the lumbar spine was performed without intravenous contrast administration. Multiplanar CT image reconstructions were also generated. COMPARISON:  CT scan dated 03/09/2017 FINDINGS: Segmentation: 5 lumbar type vertebrae. Alignment: Normal. Vertebrae: Diffuse metastatic disease throughout the entire visualized portion of the spine. There are multiple lytic and blastic lesions in the spine. However, there has been improvement in the lytic lesions since the prior CT scan of 03/09/2017. There is an old pathologic fracture of the right side of the T12 vertebral body. No discrete definable tumor within the spinal canal. There blastic lesions in the visualized portions of the iliac bones. Paraspinal and other soft tissues: Negative. Disc levels: T11-12: Disc space narrowing. No disc bulging or protrusion. T12-L1: Disc space narrowing. No disc bulging or protrusion. L1-2: No disc bulging or protrusion. L2-3: Slight protrusion of the posterior inferior aspect of the L2 vertebral endplate into the spinal canal with only minimal indentation upon the thecal sac. No focal neural impingement. L3-4: Tiny broad-based disc bulge with slight narrowing of the spinal canal symmetrically. L4-5: Broad-based disc protrusion. Hypertrophy of the ligamentum flavum and facet joints combine to create severe spinal stenosis. Moderately severe bilateral facet arthritis. L5-S1: Small broad-based disc bulge. Severe bilateral facet arthritis and ligamentum flavum hypertrophy with moderate to severe spinal stenosis. IMPRESSION: 1. No acute abnormality of the lumbar spine. 2. Extensive blastic and lytic metastatic disease throughout the visualized portion of the spine and pelvic bones, improved slightly since the prior study of 03/09/2017. 3. 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 4. Moderate to severe spinal stenosis at L5-S1. Electronically Signed   By: Lorriane Shire  M.D.   On: 09/16/2018 12:48   US Abdomen Limited Ruq  Result Date: 09/16/2018 CLINICAL DATA:  Acute RIGHT UPPER QUADRANT abdominal pain. EXAM: ULTRASOUND ABDOMEN LIMITED RIGHT UPPER QUADRANT COMPARISON:  CT abdomen and pelvis performed earlier same day and on 03/09/2017 are correlated. FINDINGS: Gallbladder: Shadowing gallstones within the contracted gallbladder, the largest measuring approximately 1.1 cm. No gallbladder wall thickening or pericholecystic fluid. Negative sonographic Murphy's sign according to the ultrasound technologist. Common bile duct: Diameter: Approximately 8 mm centrally. Obscured distally by duodenal bowel gas. No visible bile duct stone. Liver: Multiple heterogeneous masses of mixed echotexture, indicating innumerable liver metastases as noted on the earlier CT. Portal vein is patent on color Doppler imaging with normal direction of blood flow towards the liver. IMPRESSION: 1. Cholelithiasis without sonographic evidence of cholecystitis. 2. No biliary ductal dilation. 3. Innumerable liver metastases as noted on the CT earlier same day. Electronically Signed   By: Sherran Needs.D.  On: 09/16/2018 13:29        Scheduled Meds: . heparin  5,000 Units Subcutaneous Q8H  . pantoprazole  40 mg Oral Daily  . sodium chloride flush  3 mL Intravenous Q12H   Continuous Infusions: . sodium chloride 125 mL/hr at 09/17/18 0758  . lactated ringers 100 mL/hr at 09/17/18 0230  . piperacillin-tazobactam (ZOSYN)  IV 3.375 g (09/17/18 7342)     LOS: 1 day        Aline August, MD [facility] Pager [phone]  If 7PM-7AM, please contact night-coverage [email or web address] Password [email or web address] 09/17/2018, 10:12 AM

## 2018-09-18 ENCOUNTER — Telehealth: Payer: Self-pay | Admitting: Physician Assistant

## 2018-09-18 DIAGNOSIS — C787 Secondary malignant neoplasm of liver and intrahepatic bile duct: Secondary | ICD-10-CM

## 2018-09-18 DIAGNOSIS — R7989 Other specified abnormal findings of blood chemistry: Secondary | ICD-10-CM

## 2018-09-18 DIAGNOSIS — K6389 Other specified diseases of intestine: Secondary | ICD-10-CM

## 2018-09-18 DIAGNOSIS — R160 Hepatomegaly, not elsewhere classified: Secondary | ICD-10-CM

## 2018-09-18 DIAGNOSIS — R17 Unspecified jaundice: Secondary | ICD-10-CM

## 2018-09-18 DIAGNOSIS — R579 Shock, unspecified: Secondary | ICD-10-CM

## 2018-09-18 DIAGNOSIS — C7951 Secondary malignant neoplasm of bone: Secondary | ICD-10-CM

## 2018-09-18 LAB — URINE CULTURE: Culture: NO GROWTH

## 2018-09-18 LAB — CBC WITH DIFFERENTIAL/PLATELET
ABS IMMATURE GRANULOCYTES: 0.09 10*3/uL — AB (ref 0.00–0.07)
BASOS PCT: 0 %
Basophils Absolute: 0 10*3/uL (ref 0.0–0.1)
Eosinophils Absolute: 0 10*3/uL (ref 0.0–0.5)
Eosinophils Relative: 0 %
HCT: 25.8 % — ABNORMAL LOW (ref 39.0–52.0)
Hemoglobin: 8.6 g/dL — ABNORMAL LOW (ref 13.0–17.0)
Immature Granulocytes: 1 %
Lymphocytes Relative: 7 %
Lymphs Abs: 0.9 10*3/uL (ref 0.7–4.0)
MCH: 22.2 pg — ABNORMAL LOW (ref 26.0–34.0)
MCHC: 33.3 g/dL (ref 30.0–36.0)
MCV: 66.5 fL — ABNORMAL LOW (ref 80.0–100.0)
MONOS PCT: 8 %
Monocytes Absolute: 1 10*3/uL (ref 0.1–1.0)
Neutro Abs: 10.7 10*3/uL — ABNORMAL HIGH (ref 1.7–7.7)
Neutrophils Relative %: 84 %
PLATELETS: 192 10*3/uL (ref 150–400)
RBC: 3.88 MIL/uL — ABNORMAL LOW (ref 4.22–5.81)
RDW: 23.4 % — ABNORMAL HIGH (ref 11.5–15.5)
WBC: 12.8 10*3/uL — ABNORMAL HIGH (ref 4.0–10.5)
nRBC: 0.7 % — ABNORMAL HIGH (ref 0.0–0.2)

## 2018-09-18 LAB — COMPREHENSIVE METABOLIC PANEL
ALT: 67 U/L — ABNORMAL HIGH (ref 0–44)
AST: 159 U/L — ABNORMAL HIGH (ref 15–41)
Albumin: 1.6 g/dL — ABNORMAL LOW (ref 3.5–5.0)
Alkaline Phosphatase: 272 U/L — ABNORMAL HIGH (ref 38–126)
Anion gap: 17 — ABNORMAL HIGH (ref 5–15)
BUN: 49 mg/dL — ABNORMAL HIGH (ref 8–23)
CHLORIDE: 103 mmol/L (ref 98–111)
CO2: 17 mmol/L — AB (ref 22–32)
Calcium: 7.6 mg/dL — ABNORMAL LOW (ref 8.9–10.3)
Creatinine, Ser: 3.56 mg/dL — ABNORMAL HIGH (ref 0.61–1.24)
GFR calc Af Amer: 18 mL/min — ABNORMAL LOW (ref >60)
GFR calc non Af Amer: 16 mL/min — ABNORMAL LOW (ref >60)
Glucose, Bld: 86 mg/dL (ref 70–99)
Potassium: 4 mmol/L (ref 3.5–5.1)
Sodium: 137 mmol/L (ref 135–145)
Total Bilirubin: 9.3 mg/dL — ABNORMAL HIGH (ref 0.3–1.2)
Total Protein: 6.2 g/dL — ABNORMAL LOW (ref 6.5–8.1)

## 2018-09-18 LAB — MAGNESIUM: Magnesium: 2.3 mg/dL (ref 1.7–2.4)

## 2018-09-18 LAB — PHOSPHORUS: Phosphorus: 5.9 mg/dL — ABNORMAL HIGH (ref 2.5–4.6)

## 2018-09-18 LAB — TSH: TSH: 2.695 u[IU]/mL (ref 0.350–4.500)

## 2018-09-18 LAB — CORTISOL: Cortisol, Plasma: 23.7 ug/dL

## 2018-09-18 LAB — CK: Total CK: 942 U/L — ABNORMAL HIGH (ref 49–397)

## 2018-09-18 LAB — PROCALCITONIN: PROCALCITONIN: 13.03 ng/mL

## 2018-09-18 LAB — AMMONIA: AMMONIA: 26 umol/L (ref 9–35)

## 2018-09-18 MED ORDER — VANCOMYCIN VARIABLE DOSE PER UNSTABLE RENAL FUNCTION (PHARMACIST DOSING): Status: DC

## 2018-09-18 MED ORDER — PIPERACILLIN-TAZOBACTAM IN DEX 2-0.25 GM/50ML IV SOLN
2.2500 g | Freq: Four times a day (QID) | INTRAVENOUS | Status: DC
  Administered 2018-09-18 – 2018-09-19 (×3): 2.25 g via INTRAVENOUS
  Filled 2018-09-18 (×5): qty 50

## 2018-09-18 MED ORDER — VITAMINS A & D EX OINT
TOPICAL_OINTMENT | CUTANEOUS | Status: AC
  Administered 2018-09-18: 15:00:00
  Filled 2018-09-18: qty 5

## 2018-09-18 MED ORDER — ORAL CARE MOUTH RINSE
15.0000 mL | Freq: Two times a day (BID) | OROMUCOSAL | Status: DC
  Administered 2018-09-18 – 2018-09-27 (×13): 15 mL via OROMUCOSAL

## 2018-09-18 NOTE — Progress Notes (Signed)
Daily Progress Note   Patient Name: Gregory Chaney       Date: 09/18/2018 DOB: [birth year]  Age: 77 y.o. MRN#: [id number] Attending Physician: Garner Nash, DO Primary Care Physician: [name], NP Admit Date: 09/16/2018  Reason for Consultation/Follow-up: Establishing goals of care  Subjective: Awake alert Very hard of hearing Denies pain Asking to get up out of bed In no distress   A distant family member is at the bedside, see below:    Length of Stay: 2  Current Medications: Scheduled Meds:  . heparin  5,000 Units Subcutaneous Q8H  . mouth rinse  15 mL Mouth Rinse BID  . pantoprazole  40 mg Oral Daily  . sodium chloride flush  3 mL Intravenous Q12H  . vancomycin variable dose per unstable renal function (pharmacist dosing)   Does not apply See admin instructions    Continuous Infusions: . sodium chloride 125 mL/hr at 09/18/18 1100  . sodium chloride Stopped (09/18/18 0548)  . lactated ringers 100 mL/hr at 09/17/18 0244  . norepinephrine (LEVOPHED) Adult infusion Stopped (09/18/18 0856)  . piperacillin-tazobactam      PRN Meds: HYDROmorphone, ondansetron **OR** ondansetron (ZOFRAN) IV  Physical Exam         Weak appearing gentleman Diminished S 1 S 2  His abdomen is distended Has edema Awake alert Answers quite a few questions appropriately  Vital Signs: BP (!) 89/48   Pulse 79   Temp 97.9 F (36.6 C) (Oral)   Resp (!) 24   Ht 5\' 9"  (1.753 m)   Wt 96 kg   SpO2 94%   BMI 31.25 kg/m  SpO2: SpO2: 94 % O2 Device: O2 Device: Room Air O2 Flow Rate:    Intake/output summary:   Intake/Output Summary (Last 24 hours) at 09/18/2018 1328 Last data filed at 09/18/2018 1100 Gross per 24 hour  Intake 4524.44 ml  Output 15 ml  Net 4509.44 ml   LBM: Last  BM Date: 09/18/18 Baseline Weight: Weight: 89.2 kg Most recent weight: Weight: 96 kg      PPS 40% Palliative Assessment/Data:    Flowsheet Rows     Most Recent Value  Intake Tab  Referral Department  Hospitalist  Unit at Time of Referral  ICU  Palliative Care Primary Diagnosis  Cancer  Date Notified  09/16/18  Palliative Care Type  New Palliative care  Reason for referral  Clarify Goals of Care  Date of Admission  09/16/18  Date first seen by Palliative Care  09/17/18  # of days Palliative referral response time  1 Day(s)  # of days IP prior to Palliative referral  0  Clinical Assessment  Psychosocial & Spiritual Assessment  Palliative Care Outcomes      Patient Active Problem List   Diagnosis Date Noted  . AKI (acute kidney injury) ([place])   . Palliative care by specialist   . Goals of care, counseling/discussion   . Lactic acidosis 09/16/2018  . Dysphagia 09/16/2018  . Colonic mass 09/16/2018  . Hepatic metastases ([place]) 09/16/2018  . Elevated troponin 09/16/2018  . Acute renal failure ([place]) 09/16/2018  . Hyperbilirubinemia 09/16/2018  . 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 Bone metastases ([place]) 10/26/2016  . Prostate CA ([place]) 04/23/2014  . Obesity 04/23/2014  . Prostate cancer metastatic to bone ([place]) 04/23/2014  . Hematuria 04/23/2014    Palliative Care Assessment & Plan   Patient Profile:    Assessment:  77 y.o., male admitted on 09/16/2018 for hypotension shock, unknown source however known metastatic prostate cancer as well as new CT imaging concerning for metastatic colon cancer, large sigmoid lesion as well as multiple hepatic metastasis. Patient remains admitted with shock and multi organ failure. High risk for ongoing decline, decompensation and death. High possibility of the patient not surviving this hospitalization. This was discussed at the time of initial consult with patient's HCPOA  daughter [name] over the phone, as she is currently in [place].   Recommendations/Plan: Discussed with patient and PCCM, remains full code, full scope, until HCPOA arrives.  Continue current mode of care PMT will follow up.     Code Status:    Code Status Orders  (From admission, onward)         Start     Ordered   09/16/18 1648  Full code  Continuous     09/16/18 1647        Code Status History    Date Active Date Inactive Code Status Order ID Comments User Context   03/09/2017 2012 03/10/2017 2013 DNR [id number]  Jule Ser, DO ED       Prognosis:   guarded   Discharge Planning:  Anticipated hospital death versus transfer to residential hospice. This depends on the patient's hospital course and overall disease trajectory of illness, this also depends on goals of care and code status discussions to be undertaken when [facility] is available. That person is his daughter [name] who is flying in from [place] today. Anticipate further conversations in a family meeting soon and further decision making.   Care plan was discussed with  Patient, PCCM staff.   Thank you for allowing the Palliative Medicine Team to assist in the care of this patient.   Time In:  1300 Time Out: 1325 Total Time 25 Prolonged Time Billed  no       Greater than 50%  of this time was spent counseling and coordinating care related to the above assessment and plan.  Loistine Chance, MD [phone] Please contact Palliative Medicine Team phone at [phone] for questions and concerns.

## 2018-09-18 NOTE — Progress Notes (Signed)
Pharmacy Antibiotic Note  Gregory Chaney is a 77 y.o. male with a h/o metastatic pancreatic cancer and mets from a different primary (possibly colon per oncology) admitted on 09/16/2018 with lactic acidosis.  Pharmacy has been consulted for vancomycin and Zosyn dosing for possible sepsis of unknown source. Patient is in acute renal failure with worsening SCr and very little UOP since admission.   Plan: Change Zosyn dosing to 2.25 g IV q6 30 min infusion. Consider decreasing to q8 depending on plans for abx, renal function.   Vancomycin variable dosing by levels. Patient has received a total of 2500 mg since 12/28. Consider checking level 48-72 hours after last dose depending on plans for abx.   F/U renal function, culture results.   Height: 5\' 9"  (175.3 cm) Weight: 211 lb 10.3 oz (96 kg) IBW/kg (Calculated) : 70.7  Temp (24hrs), Avg:97.9 F (36.6 C), Min:97.6 F (36.4 C), Max:98.2 F (36.8 C)  Recent Labs  Lab 09/16/18 1038 09/16/18 1041 09/16/18 1237 09/17/18 0518 09/18/18 0704  WBC 12.5*  --   --  12.7* 12.8*  CREATININE 2.75*  --   --  2.95* 3.56*  LATICACIDVEN  --  7.76* 8.08*  --   --     Estimated Creatinine Clearance: 20.2 mL/min (A) (by C-G formula based on SCr of 3.56 mg/dL (H)).    Allergies  Allergen Reactions  . Cephalexin Other (See Comments)    hallucinations  . Hydrocodone Other (See Comments)    Confusion,my mind doesn't work well   . Morphine Other (See Comments)     Confusion. My mind does not work right  . Flomax [Tamsulosin Hcl] Other (See Comments)    My mind doesn't work well  . Percocet [Oxycodone-Acetaminophen] Nausea Only    Antimicrobials this admission: 12/28 Vanc >> 12/28 Zosyn >>   Dose adjustments this admission: --  Microbiology results: 12/28 BCx: NGTD 12/28 UCx:  12/28 MRSA PCR: neg  Thank you for allowing pharmacy to be a part of this patient's care.  Ulice Dash D 09/18/2018 10:22 AM

## 2018-09-18 NOTE — Progress Notes (Signed)
PT Cancellation Note  Patient Details Name: Gregory Chaney MRN: [id number] DOB: Jun 10, 1941   Cancelled Treatment:    Reason Eval/Treat Not Completed: Medical issues which prohibited therapy , moved to ICU for presumed septic shock, hypotension. Currently on levo. Family to arrive to discuss [place] with Palliative. Prognosis is not good per Palliative medicine.  Claretha Cooper 09/18/2018, 8:20 AM Tresa Endo PT Acute Rehabilitation Services Pager [phone] Office [phone]

## 2018-09-18 NOTE — Progress Notes (Signed)
IP PROGRESS NOTE  Subjective:   Gregory Chaney is a 77 year old man of seen in the past for the treatment of prostate cancer although he has not follow-up for his care since 2018.  Presented on 09/16/2018 with generalized pain and imaging studies showed advanced malignancy with hepatic involvement and possible primary colonic mass.  Clinically, Gregory Chaney appears not in any major distress.  He does report abdominal and chest discomfort but no hematochezia or melena.  He is extremely hard of hearing and poor historian and unable to give much detail about his overall health leading up to this admission.    Objective:  Vital signs in last 24 hours: Temp:  [97.6 F (36.4 C)-98.2 F (36.8 C)] 98.2 F (36.8 C) (12/30 0314) Pulse Rate:  [72-111] 90 (12/30 0700) Resp:  [14-28] 25 (12/30 0700) BP: (76-126)/(37-66) 108/56 (12/30 0700) SpO2:  [91 %-96 %] 94 % (12/30 0700) Weight:  [211 lb 10.3 oz (96 kg)] 211 lb 10.3 oz (96 kg) (12/30 0500) Weight change: 14 lb 15.9 oz (6.8 kg) Last BM Date: 09/15/18  Intake/Output from previous day: 12/29 0701 - 12/30 0700 In: 4724.9 [P.O.:240; I.V.:2789.6; IV Piggyback:1695.4] Out: 40 [Urine:40] General: Alert, awake without distress. Head: Normocephalic atraumatic. Mouth: mucous membranes moist, pharynx normal without lesions Eyes: No scleral icterus.  Pupils are equal and round reactive to light. Resp: clear to auscultation bilaterally without rhonchi or wheezes or dullness to percussion. Cardio: regular rate and rhythm, S1, S2 normal, no murmur, click, rub or gallop GI: soft, non-tender; bowel sounds normal; no masses,  no organomegaly Musculoskeletal: No joint deformity or effusion. Neurological: No motor, sensory deficits.  Intact deep tendon reflexes. Skin: No rashes or lesions.   Lab Results: Recent Labs    09/16/18 1038 09/17/18 0518  WBC 12.5* 12.7*  HGB 10.0* 7.7*  HCT 29.5* 21.5*  PLT 214 152    BMET Recent Labs    09/16/18 1038  09/17/18 0518  NA 137 137  K 3.5 4.3  CL 99 103  CO2 18* 21*  GLUCOSE 104* 89  BUN 41* 43*  CREATININE 2.75* 2.95*  CALCIUM 8.7* 7.8*    Studies/Results: Ct Abdomen Pelvis Wo Contrast  Result Date: 09/16/2018 CLINICAL DATA:  Abdominal distention. Metastatic prostate cancer. EXAM: CT ABDOMEN AND PELVIS WITHOUT CONTRAST TECHNIQUE: Multidetector CT imaging of the abdomen and pelvis was performed following the standard protocol without IV contrast. COMPARISON:  CT scan dated 03/09/2017 FINDINGS: Lower chest: On image number 1 there is an enlarged subcarinal lymph node, 18 mm in diameter. Heart size is normal. Minimal pericardial effusion. Minimal linear scarring at the lung bases. No effusions. Hepatobiliary: New extensive metastatic disease throughout the liver with new hepatomegaly due to the metastatic disease. Cholelithiasis. No dilated bile ducts. Pancreas: Unremarkable. No pancreatic ductal dilatation or surrounding inflammatory changes. Spleen: New ill-defined 3.2 cm lesion in the posterior aspect of the spleen. Adrenals/Urinary Tract: Adrenal glands are normal. No hydronephrosis. 16 mm partially calcified lesion in the mid right kidney which appears new since the prior study. Massive enlargement of the prostate gland extending into the bladder. Prostate gland measures approximately 8 cm in diameter and fills most of the bladder. Stomach/Bowel: There is a lobulated annular mass involving the mid sigmoid portion of the colon best seen on image 61 of series 2. No obstruction. Moderate hiatal hernia. Small bowel appears normal. Appendix is normal. Vascular/Lymphatic: Aortic atherosclerosis. No discrete adenopathy. Reproductive: Massive enlargement of the prostate gland filling most of the bladder. Other: There  is a small amount of ascites adjacent to the right lobe of the liver and in the right pericolic gutter extending into the right side of the pelvis. Anasarca. Musculoskeletal: There are  extensive blastic and lytic lesions throughout the visualized portion of the skeleton consistent with metastatic prostate cancer. The overall appearance of the metastatic disease is significantly improved since the prior study. IMPRESSION: 1. New extensive metastatic disease throughout the liver creating hepatomegaly. New ill-defined 3.2 cm lesion in the posterior aspect of the spleen. New subcarinal adenopathy. 2. New annular mass in the mid sigmoid portion of the colon without obstruction. 3. Massive enlargement of the prostate gland. 4. Extensive blastic and lytic metastatic disease throughout the visualized portion of the skeleton from a previously demonstrated metastatic prostate cancer, significantly improved since the prior study. 5. 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 Alternatively, this could represent atypical metastatic prostate cancer. Electronically Signed   By: Lorriane Shire M.D.   On: 09/16/2018 13:00       Ct L-spine No Charge  Result Date: 09/16/2018 CLINICAL DATA:  Pain secondary to a fall. Metastatic prostate cancer. Weakness. EXAM: CT LUMBAR SPINE WITHOUT CONTRAST TECHNIQUE: Multidetector CT imaging of the lumbar spine was performed without intravenous contrast administration. Multiplanar CT image reconstructions were also generated. COMPARISON:  CT scan dated 03/09/2017 FINDINGS: Segmentation: 5 lumbar type vertebrae. Alignment: Normal. Vertebrae: Diffuse metastatic disease throughout the entire visualized portion of the spine. There are multiple lytic and blastic lesions in the spine. However, there has been improvement in the lytic lesions since the prior CT scan of 03/09/2017. There is an old pathologic fracture of the right side of the T12 vertebral body. No discrete definable tumor within the spinal canal. There blastic lesions in the visualized portions of the iliac bones.  Paraspinal and other soft tissues: Negative. Disc levels: T11-12: Disc space narrowing. No disc bulging or protrusion. T12-L1: Disc space narrowing. No disc bulging or protrusion. L1-2: No disc bulging or protrusion. L2-3: Slight protrusion of the posterior inferior aspect of the L2 vertebral endplate into the spinal canal with only minimal indentation upon the thecal sac. No focal neural impingement. L3-4: Tiny broad-based disc bulge with slight narrowing of the spinal canal symmetrically. L4-5: Broad-based disc protrusion. Hypertrophy of the ligamentum flavum and facet joints combine to create severe spinal stenosis. Moderately severe bilateral facet arthritis. L5-S1: Small broad-based disc bulge. Severe bilateral facet arthritis and ligamentum flavum hypertrophy with moderate to severe spinal stenosis. IMPRESSION: 1. No acute abnormality of the lumbar spine. 2. Extensive blastic and lytic metastatic disease throughout the visualized portion of the spine and pelvic bones, improved slightly since the prior study of 03/09/2017. 3. 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 4. Moderate to severe spinal stenosis at L5-S1. Electronically Signed   By: Lorriane Shire M.D.   On: 09/16/2018 12:48       Medications: I have reviewed the patient's current medications.  Assessment/Plan:  77 year old man with the following:  1.  Advanced prostate cancer with disease to the bone initially diagnosed in 2015 and did not receive any definitive therapy.  He subsequently developed spine metastasis that required radiation in 2018 and subsequently started on Lupron.  Patient failed to follow-up after that.  It is difficult to assess his disease status at this time given the fact that he has not received any definitive treatments and whether he would be hormone sensitive given  his poor compliance.  2.  Extensive visceral metastasis noted including lymphadenopathy and hepatic metastasis.  Etiology appears to be from a different  primary most likely colon cancer.  The preferable approach would be to obtain tissue biopsy for confirmation but I am afraid that that will not likely make a difference for his overall management and prognosis.  Despite his reasonable performance status, his disease has progressed rather extensively and now we are dealing with 2 incurable malignancies and I doubt he will be able to tolerate systemic chemotherapy for palliative purposes.  I advised to obtain a palliative medicine consult to clarify goals of care with him at this time given his overall poor prognosis.  3.  Prognosis and goals of care: He has likely to incurable malignancy with rather extensive involvement.  I doubt systemic chemotherapy will offer much palliation at this time.  Given his history of noncompliance and his overall debilitation, I fear that his prognosis is very poor with limited life expectancy.  I am in favor of palliative care involvement and possibly transitioning to hospice.  We will continue to follow his progress during his hospitalization.  25  minutes was spent with the patient face-to-face today.  More than 50% of time was dedicated to reviewing his disease status, imaging studies and updating the patient about these findings.      LOS: 2 days   Zola Button 09/18/2018, 7:53 AM

## 2018-09-18 NOTE — Progress Notes (Deleted)
Pt R PAC doesn't have a biopatch in placed. Pt PAC hpn needle due to be changed 09/20/18 Wednesday. Pt wants it to be changed on that day. IV RN explained the risk of not having biopatch on per policy and changing the dressing and accidentally pulling out the needle. Pt stated to wait when the needle is due to be changed. R PAC site, clean, dry and intact. L PICC  Also clean, dry and intact with biopatch on. RN and charged RN made aware. Will cont to monitor.

## 2018-09-18 NOTE — Progress Notes (Addendum)
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 Presented 12/28 w/ weakness and a fall.  In the ED the patient was noted to be jaundiced and in ARF.  Patient was admitted and a CT of the abdomen/pelvis was done that revealed diffuse liver mets and LFTs were elevated.  Patient was given a total of 6 liters of IVF but remained hypotensive. PCCM was consulted for septic shock of unknown source.    Past Medical History  Widely metastatic prostate cancer now with liver and renal failure and circulatory shock unknown cause.  Significant Hospital Events   12/29 transfer to the ICU for presumed septic shock; abx continued. Started on peripheral pressors; seen by onc->deemed not candidate for even palliative chemo, reached out to family.  12/30: renal fxn worse, acid base worse.  Consults:  PCCM Palliative care  Procedures:  None  Significant Diagnostic Tests:  CT with widely metastatic disease 12/29  Micro Data:  Blood 12/28>>> Urine 12/28>>> MRSA PCR 12/28 negative  Antimicrobials:  Zosyn 12/28>>> Vancomycin 12/29>>>   Interim history/subjective:   No distress. Denies pain   Objective   Blood pressure (Abnormal) 108/56, pulse 90, temperature 97.7 F (36.5 C), temperature source Oral, resp. rate (Abnormal) 25, height 5\' 9"  (1.753 m), weight 96 kg, SpO2 94 %.        Intake/Output Summary (Last 24 hours) at 09/18/2018 0815 Last data filed at 09/18/2018 0500 Gross per 24 hour  Intake 4724.9 ml  Output 40 ml  Net 4684.9 ml   Filed Weights   09/16/18 1600 09/18/18 0500  Weight: 89.2 kg 96 kg    Examination: General: 77 year old male patient resting in bed he is in no acute distress HEENT normocephalic atraumatic  mucous membranes moist Pulmonary: Clear to auscultation Cardiac: Regular rate and rhythm Abdomen: Soft, not currently tender, no organomegaly GU: Minimal concentrated urine Extremities: Chronic lower extremity edema warm, dry, pulses strong. Neuro: Awake, very hard of hearing.  Confused at times.  Moves all extremities without focal deficits  Resolved Hospital Problem list   N/A  Assessment & Plan:  77 year old male with PMH of widely metastatic prostate cancer who presents to PCCM with circulatory shock, unknown cause.   Circulatory Shock: presumed septic (source not clear), unsure if adrenal insufficiency given widely spread mets and unknown medications at home.  -cultures to date neg but PCT elevated -random cort was > 20 -wbc about the same Plan  Cont abx day #3 vancomycin and Zosyn F/u cultures  Wean levophed for MAP > 65; will not offer central access given stage IV disease Cont MIVFs Discontinue Solu-Cortef  Lactic acidosis: with acute renal failure and liver failure.  -acid base & anion gap worse over night -renal fxn cont to decline Plan Renal dose meds MAP goal > 65 Strict I&O Will not offer HD in pt w/ stage IV disease   Acute metabolic Encephalopathy  -suspect sepsis, but has mildly elevated ammonia as well as renal  Failure adding to possible  metabolic etiology Plan  Anemia of chronic disease -no evidence of bleeding Plan Trend CBC  Widely metastatic prostate cancer w/ concern about second source of malignancy from colon.  -deemed not a candidate  for even palliative chemo Plan Supportive care Most important thing here is establishing limitations.  Feel strongly that we should be transitioning to hospice   Best practice    DVT: [place] heparin  PUD PPI Glycemic control: NA Activity: BR Diet: Regular Code status: Currently full code Disposition critically ill due to circulatory shock.  Presumed sepsis given elevated procalcitonin but source still  unclear.  I wonder if this reflects abdominal translocation given widely metastatic disease.  His pressor requirements are stabilizing.  The most important intervention at this point should be discussion of goals of care, establishing reasonable limitations, and trying to set him up with resources following discharge to ensure the highest quality of life for what time he has left. PCCM will pick up as primary for now and IM will keep on rounding list  Erick Colace ACNP-BC [place] Pager # [phone] OR # [phone] if no answer

## 2018-09-18 NOTE — Progress Notes (Signed)
Patient ID: Gregory Chaney, male   DOB: 25-Apr-1941, 77 y.o.   MRN: [id number] Overnight events noted.  PCCM has evaluated the patient and patient is currently on Levophed drip.  His creatinine is worsening with very minimal urine output.  I have spoken to [facility]/PCCM face to face and also with Dr. Valeta Harms on phone.  Patient is currently critically ill and will be managed by PCCM services.  Hopefully the family will be available at bedside today.  Overall prognosis is very poor.  Consider comfort measures if family is agreeable.  Hospitalist service will remain the primary team for today; if the patient's family decides for aggressive measures, patient might need to be transferred to [employer] service.  Patient is a very poor candidate for any aggressive measures at this time.

## 2018-09-18 NOTE — Telephone Encounter (Signed)
The pt is inpatient and has had CT scan.  CT for 12/31 has been cancelled.  Carollee Herter

## 2018-09-18 NOTE — Telephone Encounter (Signed)
The patient does have an appointment with Dr. Alen Blew on 09-25-2018 and labs that day as well. ( [place]).

## 2018-09-18 NOTE — Progress Notes (Signed)
OT Cancellation Note  Patient Details Name: Gregory Chaney MRN: [id number] DOB: 1941/05/03   Cancelled Treatment:    Reason Eval/Treat Not Completed: Medical issues which prohibited therapy. Moved to ICU for presumed septic shock, hypotension. Currently on levo. Family to arrive to discuss [place] with Palliative. Prognosis is not good per Palliative medicine.   Ramond Dial, OT/L   Acute OT Clinical Specialist Acute Rehabilitation Services Pager [phone] Office [phone]  09/18/2018, 8:29 AM

## 2018-09-19 ENCOUNTER — Ambulatory Visit (HOSPITAL_COMMUNITY): Admission: RE | Admit: 2018-09-19 | Payer: Medicare PPO | Source: Ambulatory Visit

## 2018-09-19 ENCOUNTER — Inpatient Hospital Stay: Payer: Self-pay

## 2018-09-19 DIAGNOSIS — C61 Malignant neoplasm of prostate: Secondary | ICD-10-CM

## 2018-09-19 DIAGNOSIS — Z515 Encounter for palliative care: Secondary | ICD-10-CM

## 2018-09-19 DIAGNOSIS — C7911 Secondary malignant neoplasm of bladder: Secondary | ICD-10-CM

## 2018-09-19 LAB — COMPREHENSIVE METABOLIC PANEL
ALT: 64 U/L — ABNORMAL HIGH (ref 0–44)
AST: 111 U/L — ABNORMAL HIGH (ref 15–41)
Albumin: 1.6 g/dL — ABNORMAL LOW (ref 3.5–5.0)
Alkaline Phosphatase: 255 U/L — ABNORMAL HIGH (ref 38–126)
Anion gap: 18 — ABNORMAL HIGH (ref 5–15)
BILIRUBIN TOTAL: 9.1 mg/dL — AB (ref 0.3–1.2)
BUN: 55 mg/dL — ABNORMAL HIGH (ref 8–23)
CO2: 16 mmol/L — ABNORMAL LOW (ref 22–32)
Calcium: 7.7 mg/dL — ABNORMAL LOW (ref 8.9–10.3)
Chloride: 104 mmol/L (ref 98–111)
Creatinine, Ser: 4.03 mg/dL — ABNORMAL HIGH (ref 0.61–1.24)
GFR calc non Af Amer: 13 mL/min — ABNORMAL LOW (ref >60)
GFR, EST AFRICAN AMERICAN: 16 mL/min — AB (ref >60)
Glucose, Bld: 128 mg/dL — ABNORMAL HIGH (ref 70–99)
Potassium: 4.1 mmol/L (ref 3.5–5.1)
Sodium: 138 mmol/L (ref 135–145)
Total Protein: 6 g/dL — ABNORMAL LOW (ref 6.5–8.1)

## 2018-09-19 LAB — CBC WITH DIFFERENTIAL/PLATELET
Abs Immature Granulocytes: 0.14 10*3/uL — ABNORMAL HIGH (ref 0.00–0.07)
Basophils Absolute: 0 10*3/uL (ref 0.0–0.1)
Basophils Relative: 0 %
Eosinophils Absolute: 0 10*3/uL (ref 0.0–0.5)
Eosinophils Relative: 0 %
HCT: 25.7 % — ABNORMAL LOW (ref 39.0–52.0)
Hemoglobin: 8.6 g/dL — ABNORMAL LOW (ref 13.0–17.0)
Immature Granulocytes: 1 %
Lymphocytes Relative: 9 %
Lymphs Abs: 1.2 10*3/uL (ref 0.7–4.0)
MCH: 21.7 pg — AB (ref 26.0–34.0)
MCHC: 33.5 g/dL (ref 30.0–36.0)
MCV: 64.9 fL — AB (ref 80.0–100.0)
MONO ABS: 1.3 10*3/uL — AB (ref 0.1–1.0)
MONOS PCT: 10 %
Neutro Abs: 10.5 10*3/uL — ABNORMAL HIGH (ref 1.7–7.7)
Neutrophils Relative %: 80 %
Platelets: 152 10*3/uL (ref 150–400)
RBC: 3.96 MIL/uL — ABNORMAL LOW (ref 4.22–5.81)
RDW: 22.8 % — ABNORMAL HIGH (ref 11.5–15.5)
WBC: 13.1 10*3/uL — ABNORMAL HIGH (ref 4.0–10.5)
nRBC: 0.8 % — ABNORMAL HIGH (ref 0.0–0.2)

## 2018-09-19 LAB — FOLATE RBC
FOLATE, HEMOLYSATE: 581.1 ng/mL
Folate, RBC: 2411 ng/mL (ref >498)
Hematocrit: 24.1 % — ABNORMAL LOW (ref 37.5–51.0)

## 2018-09-19 LAB — MAGNESIUM: Magnesium: 2.1 mg/dL (ref 1.7–2.4)

## 2018-09-19 LAB — PROCALCITONIN: Procalcitonin: 12.56 ng/mL

## 2018-09-19 MED ORDER — SODIUM CHLORIDE 0.9% FLUSH: 10.0000 mL | INTRAVENOUS | Status: DC | PRN

## 2018-09-19 MED ORDER — SODIUM CHLORIDE 0.9% FLUSH
10.0000 mL | Freq: Two times a day (BID) | INTRAVENOUS | Status: DC
  Administered 2018-09-19 – 2018-09-22 (×4): 10 mL

## 2018-09-19 MED ORDER — AMOXICILLIN-POT CLAVULANATE 500-125 MG PO TABS
500.0000 mg | ORAL_TABLET | Freq: Two times a day (BID) | ORAL | Status: DC
  Administered 2018-09-19 – 2018-09-26 (×14): 500 mg via ORAL
  Filled 2018-09-19 (×16): qty 1

## 2018-09-19 MED ORDER — ZOLPIDEM TARTRATE 5 MG PO TABS
5.0000 mg | ORAL_TABLET | Freq: Once | ORAL | Status: AC
  Administered 2018-09-19: 5 mg via ORAL
  Filled 2018-09-19: qty 1

## 2018-09-19 MED ORDER — AMOXICILLIN-POT CLAVULANATE 875-125 MG PO TABS: 1.0000 | ORAL_TABLET | Freq: Two times a day (BID) | ORAL | Status: DC

## 2018-09-19 MED ORDER — CHLORHEXIDINE GLUCONATE CLOTH 2 % EX PADS
6.0000 | MEDICATED_PAD | Freq: Every day | CUTANEOUS | Status: DC
  Administered 2018-09-19 – 2018-09-20 (×2): 6 via TOPICAL

## 2018-09-19 MED ORDER — PIPERACILLIN-TAZOBACTAM IN DEX 2-0.25 GM/50ML IV SOLN
2.2500 g | Freq: Three times a day (TID) | INTRAVENOUS | Status: DC
  Filled 2018-09-19: qty 50

## 2018-09-19 NOTE — Care Management Note (Signed)
Case Management Note  Patient Details  Name: Gregory Chaney MRN: [id number] Date of Birth: 18-Jul-1941  Subjective/Objective:                   Discharge readiness is indicated by patient meeting Recovery Milestones, including ALL of the following: ? Hemodynamic stability bp=72/41 on iv levophed drip ? Fever absent or reduced absent ? Hypoxemia absent 02 via nasal cannual ? Mental status at baseline/confused ? End-organ dysfunction (eg, myocardial ischemia, renal failure) absent bun 55, creatine=4.03 ? Metabolic derangement (eg, dehydration, acidosis) absent anion gap=18 ? Cultures negative or infection identified and under adequate treatment/ pending ? Ambulatory/no  ? Oral hydration, medications[P] iv zosyn, iv flds at 125cc/hr ? goc meeting for today with family.   Action/Plan: Will follow for progression of care and clinical status. Will follow for case management needs none present at this time.  Expected Discharge Date:  09/19/18               Expected Discharge Plan:     In-House Referral:     Discharge planning Services     Post Acute Care Choice:    Choice offered to:     DME Arranged:    DME Agency:     HH Arranged:    HH Agency:     Status of Service:     If discussed at [employer], dates discussed:    Additional Comments:  Leeroy Cha, RN 09/19/2018, 8:44 AM

## 2018-09-19 NOTE — Progress Notes (Signed)
Daily Progress Note   Patient Name: Gregory Chaney       Date: 09/19/2018 DOB: [birth year]  Age: 77 y.o. MRN#: [id number] Attending Physician: Garner Nash, DO Primary Care Physician: [name], NP Admit Date: 09/16/2018  Reason for Consultation/Follow-up: Establishing goals of care  Subjective: Sleeping but arouses easily.  Denies complaints and falls immediately back to sleep.  Very hard of hearing In no distress   His granddaughter's mother in law is at the bedside, see below:    Length of Stay: 3  Current Medications: Scheduled Meds:  . amoxicillin-clavulanate  500 mg Oral BID  . heparin  5,000 Units Subcutaneous Q8H  . mouth rinse  15 mL Mouth Rinse BID  . pantoprazole  40 mg Oral Daily  . sodium chloride flush  3 mL Intravenous Q12H    Continuous Infusions: . sodium chloride Stopped (09/18/18 0548)  . lactated ringers Stopped (09/19/18 1111)    PRN Meds: HYDROmorphone, ondansetron **OR** ondansetron (ZOFRAN) IV  Physical Exam         Weak appearing gentleman Diminished S 1 S 2  His abdomen is distended Has edema Awake alert Answers quite a few questions appropriately  Vital Signs: BP 106/61 (BP Location: Left Arm)   Pulse 79   Temp (!) 97.2 F (36.2 C) (Oral)   Resp (!) 21   Ht 5\' 9"  (1.753 m)   Wt 100.1 kg   SpO2 96%   BMI 32.59 kg/m  SpO2: SpO2: 96 % O2 Device: O2 Device: Room Air O2 Flow Rate:    Intake/output summary:   Intake/Output Summary (Last 24 hours) at 09/19/2018 1436 Last data filed at 09/19/2018 0800 Gross per 24 hour  Intake 2094.29 ml  Output 10 ml  Net 2084.29 ml   LBM: Last BM Date: 09/18/18 Baseline Weight: Weight: 89.2 kg Most recent weight: Weight: 100.1 kg      PPS 40% Palliative Assessment/Data:    Flowsheet  Rows     Most Recent Value  Intake Tab  Referral Department  Hospitalist  Unit at Time of Referral  ICU  Palliative Care Primary Diagnosis  Cancer  Date Notified  09/16/18  Palliative Care Type  New Palliative care  Reason for referral  Clarify Goals of Care  Date of Admission  09/16/18  Date first seen by Palliative  Care  09/17/18  # of days Palliative referral response time  1 Day(s)  # of days IP prior to Palliative referral  0  Clinical Assessment  Psychosocial & Spiritual Assessment  Palliative Care Outcomes      Patient Active Problem List   Diagnosis Date Noted  . AKI (acute kidney injury) ([place])   . Palliative care by specialist   . Goals of care, counseling/discussion   . Lactic acidosis 09/16/2018  . Dysphagia 09/16/2018  . Colonic mass 09/16/2018  . Hepatic metastases ([place]) 09/16/2018  . Elevated troponin 09/16/2018  . Acute renal failure ([place]) 09/16/2018  . Hyperbilirubinemia 09/16/2018  . 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 Bone metastases ([place]) 10/26/2016  . Prostate CA ([place]) 04/23/2014  . Obesity 04/23/2014  . Prostate cancer metastatic to bone ([place]) 04/23/2014  . Hematuria 04/23/2014    Palliative Care Assessment & Plan   Patient Profile:    Assessment:  - 77 y.o., male admitted on 09/16/2018 for hypotension shock, unknown source however known metastatic prostate cancer as well as new CT imaging concerning for metastatic colon cancer, large sigmoid lesion as well as multiple hepatic metastasis. - Patient remains admitted with shock and multi organ failure. High risk for ongoing decline, decompensation and death. High possibility of the patient not surviving this hospitalization. This was discussed with patient's HCPOA daughter [name] over the phone by Drs. Anwar and Icard.  She is working to get to [place].  Her flight has been delayed/rerouted due to weather.  Per her hother in Sports coach,  she is currently stuck in [place] with plan to [place] via [place].  Hopeful top arrive late this evening.  Recommendations/Plan: Remains full code, full scope, until HCPOA arrives.  Continue current mode of care PMT will follow up tomorrow.     Code Status:    Code Status Orders  (From admission, onward)         Start     Ordered   09/16/18 1648  Full code  Continuous     09/16/18 1647        Code Status History    Date Active Date Inactive Code Status Order ID Comments User Context   03/09/2017 2012 03/10/2017 2013 DNR [id number]  Jule Ser, DO ED       Prognosis:   guarded   Discharge Planning:  Anticipated hospital death versus transfer to residential hospice. This depends on the patient's hospital course and overall disease trajectory of illness, this also depends on goals of care and code status discussions to be undertaken when [facility] is available. That person is his daughter [name] who is flying in from [place] today. Anticipate further conversations in a family meeting soon and further decision making.   Care plan was discussed with  Patient, PCCM staff.   Thank you for allowing the Palliative Medicine Team to assist in the care of this patient.   Time In:  1400 Time Out: 1425 Total Time 25 Prolonged Time Billed  no       Greater than 50%  of this time was spent counseling and coordinating care related to the above assessment and plan.  Micheline Rough, MD  Please contact Palliative Medicine Team phone at [phone] for questions and concerns.

## 2018-09-19 NOTE — Progress Notes (Signed)
Peripherally Inserted Central Catheter/Midline Placement  The IV Nurse has discussed with the patient and/or persons authorized to consent for the patient, the purpose of this procedure and the potential benefits and risks involved with this procedure.  The benefits include less needle sticks, lab draws from the catheter, and the patient may be discharged home with the catheter. Risks include, but not limited to, infection, bleeding, blood clot (thrombus formation), and puncture of an artery; nerve damage and irregular heartbeat and possibility to perform a PICC exchange if needed/ordered by physician.  Alternatives to this procedure were also discussed.  Bard Power PICC patient education guide, fact sheet on infection prevention and patient information card has been provided to patient /or left at bedside.    PICC/Midline Placement Documentation  PICC Single Lumen 09/19/18 PICC Right Brachial 36 cm 0 cm (Active)  Indication for Insertion or Continuance of Line Home intravenous therapies (PICC only) 09/19/2018  4:00 PM  Exposed Catheter (cm) 0 cm 09/19/2018  4:00 PM  Site Assessment Clean;Dry;Intact 09/19/2018  4:00 PM  Line Status Flushed;Saline locked;Blood return noted 09/19/2018  4:00 PM  Dressing Type Transparent;Securing device 09/19/2018  4:00 PM  Dressing Status Clean;Dry;Intact;Antimicrobial disc in place 09/19/2018  4:00 PM  Line Care Connections checked and tightened 09/19/2018  4:00 PM  Dressing Intervention New dressing 09/19/2018  4:00 PM  Dressing Change Due 09/26/18 09/19/2018  4:00 PM       Virgilio Belling 09/19/2018, 4:03 PM

## 2018-09-19 NOTE — Progress Notes (Signed)
Critical care NP made aware that patient does not have IV access. IV team attempted with [place] and was unable to place a PIV.  New orders received.  Will continue to monitor.

## 2018-09-19 NOTE — Progress Notes (Signed)
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 Presented 12/28 w/ weakness and a fall.  In the ED the patient was noted to be jaundiced and in ARF.  Patient was admitted and a CT of the abdomen/pelvis was done that revealed diffuse liver mets and LFTs were elevated.  Patient was given a total of 6 liters of IVF but remained hypotensive. PCCM was consulted for septic shock of unknown source.    Past Medical History  Widely metastatic prostate cancer now with liver and renal failure and circulatory shock unknown cause.  Significant Hospital Events   12/29 transfer to the ICU for presumed septic shock; abx continued. Started on peripheral pressors; seen by onc->deemed not candidate for even palliative chemo, reached out to family.  12/30: renal fxn worse, acid base worse.  12/31: Off pressors.  Procalcitonin trending down.  Still confused.  Renal function worse acid-base worse.  Awaiting family arrival for [facility] for goals of care discussion of care signing Consults:  PCCM Palliative care  Procedures:  None  Significant Diagnostic Tests:  CT with widely metastatic disease 12/29  Micro Data:  Blood 12/28>>> Urine 12/28>>> MRSA PCR 12/28 negative  Antimicrobials:  Zosyn 12/28>>> Vancomycin 12/29>>> 12/31 Interim history/subjective:   No distress. Denies pain   Objective   Blood pressure 102/60, pulse 87, temperature (Abnormal) 97.2 F (36.2 C), temperature source Oral, resp. rate 15, height 5\' 9"  (1.753 m), weight 100.1 kg, SpO2 93 %.        Intake/Output Summary (Last 24 hours) at 09/19/2018 0841 Last data filed at 09/19/2018 0300 Gross per 24 hour  Intake 2802.12 ml  Output 10 ml  Net 2792.12 ml   Filed Weights     09/16/18 1600 09/18/18 0500 09/19/18 0500  Weight: 89.2 kg 96 kg 100.1 kg    Examination: General: This is a 77 year old male resting in bed is very hard of hearing but in no acute distress today he continues to be confused HEENT normocephalic sclera are icteric mucous membranes are moist no jugular venous distention appreciated Pulmonary: Clear to auscultation without accessory use Cardiac: Regular rate and rhythm without gallop Abdomen: Slightly distended no organomegaly positive bowel sounds GU: Diminished urine output Neuro: Awake, follows commands, moves all extremities, intermittently confused, very hard of hearing. Extremities: Warm and dry dependent edema is appreciated  Resolved Hospital Problem list   N/A  Assessment & Plan:  77 year old male with PMH of widely metastatic prostate cancer who presents to PCCM with circulatory shock, unknown cause.   Circulatory Shock: presumed septic (source not clear, wonder about possible abdominal translocation from diffuse metastatic disease) -cultures to date neg but PCT elevated -random cort was > 20 Plan  Day #4 vancomycin and Zosyn, we will discontinue the vancomycin KVO IV fluids  Lactic acidosis: with acute renal failure and liver failure.  His renal function as well as acid-base continue to decline Plan Renal dose medications Not a candidate for dialysis No further escalation   Acute metabolic Encephalopathy  -suspect sepsis, but has mildly elevated ammonia as well as renal  Failure adding to possible  metabolic etiology, this is further complicated by his hearing loss he does seem a little more  confused as of 12/39 Plan Hold sedating medications Continue supportive care  Anemia of chronic disease Hemoglobin remains stable Plan CBC  Widely metastatic prostate cancer w/ concern about second source of malignancy from colon.  -deemed not a candidate for even palliative chemo Plan Strongly urged to DO NOT  RESUSCITATE status, would benefit from home hospice work possibly residential hospice if qualifies  Best practice    DVT: [place] heparin  PUD PPI Glycemic control: NA Activity: BR Diet: Regular Code status: Currently full code Disposition we will keep in ICU for now, hemodynamically he is off pressors.  Although he is not currently critically ill from a hemodynamic standpoint he remains quite sick and terminally ill as a consequence of his advanced cancer.  We are awaiting family arrival from out of town to further discuss goals of care.  Strongly urged home or residential hospice.  Critical care will sign off effective January 1 with triad reassume care   Erick Colace ACNP-BC [place] Pager # [phone] OR # [phone] if no answer

## 2018-09-19 NOTE — Progress Notes (Addendum)
Pharmacy Antibiotic Note  Gregory Chaney is a 77 y.o. male with a h/o metastatic pancreatic cancer and mets from a different primary (possibly colon per oncology) admitted on 09/16/2018 with lactic acidosis.  Pharmacy has been consulted for vancomycin and Zosyn dosing for possible sepsis of unknown source. Patient is in acute renal failure with worsening SCr and very little UOP since admission.   Today, 09/19/2018:  WBC slightly up (receiving steroids)  Afebrile, some low temps  SCr worsening, CK noted to be elevated  MD stopped vancomycin this AM; changing Zosyn to Augmentin  Plan:  Adjust Augmentin to 500 mg q12 hr for CrCl 10-30 ml/min  Pharmacy will sign off, following peripherally for Cx results and renal adjustments  Height: 5\' 9"  (175.3 cm) Weight: 220 lb 10.9 oz (100.1 kg) IBW/kg (Calculated) : 70.7  Temp (24hrs), Avg:97.6 F (36.4 C), Min:97.2 F (36.2 C), Max:97.9 F (36.6 C)  Recent Labs  Lab 09/16/18 1038 09/16/18 1041 09/16/18 1237 09/17/18 0518 09/18/18 0704 09/19/18 0315  WBC 12.5*  --   --  12.7* 12.8* 13.1*  CREATININE 2.75*  --   --  2.95* 3.56* 4.03*  LATICACIDVEN  --  7.76* 8.08*  --   --   --     Estimated Creatinine Clearance: 18.2 mL/min (A) (by C-G formula based on SCr of 4.03 mg/dL (H)).    Allergies  Allergen Reactions  . Cephalexin Other (See Comments)    hallucinations  . Hydrocodone Other (See Comments)    Confusion,my mind doesn't work well   . Morphine Other (See Comments)     Confusion. My mind does not work right  . Flomax [Tamsulosin Hcl] Other (See Comments)    My mind doesn't work well  . Percocet [Oxycodone-Acetaminophen] Nausea Only    Antimicrobials this admission: 12/28 Vanc >> 12/31 12/28 Zosyn >> 12/31 Augmentin >>  Dose adjustments this admission:  Microbiology results: 12/28 BCx: NGTD 12/28 UCx: NGF 12/28 MRSA PCR: neg  Thank you for allowing pharmacy to be a part of this patient's care.  Reuel Boom,  PharmD, BCPS [phone] 09/19/2018, 9:39 AM

## 2018-09-19 NOTE — Progress Notes (Signed)
OT Cancellation Note  Patient Details Name: Gregory Chaney MRN: [id number] DOB: 09/19/1941   Cancelled Treatment:    Reason Eval/Treat Not Completed: Other (comment).  Pt with multiple medical issues and HCPOA is en route. Will follow up on 1/2 to check plan.  [name] 09/19/2018, 3:06 PM  Lesle Chris, OTR/L Acute Rehabilitation Services [phone] [facility] pager [phone] office 09/19/2018

## 2018-09-19 NOTE — Progress Notes (Signed)
RN alerted by tele sitter of patient's behavior. Patient was removing PIV as RN walked in the room. RN applied pressure to patient's bleeding site. Bleeding stopped. Patient also pulled of leads from tele monitor and gown. When attempted to place leads back on, patient became extremely agitated and stated "if I die, I die." Patient currently off of tele monitoring.  Will update MD during rounds this morning. Palliative family meeting pending for today.   Will continue to monitor.

## 2018-09-19 NOTE — Progress Notes (Signed)
PT Cancellation Note  Patient Details Name: Gregory Chaney MRN: [id number] DOB: 30-May-1941   Cancelled Treatment:    Reason Eval/Treat Not Completed: Medical issues which prohibited therapy, will follow up 1/2 for [place] after family arrives.   Claretha Cooper 09/19/2018, 12:21 PM Tresa Endo PT Acute Rehabilitation Services Pager [phone] Office [phone]

## 2018-09-20 LAB — BASIC METABOLIC PANEL
ANION GAP: 18 — AB (ref 5–15)
BUN: 58 mg/dL — ABNORMAL HIGH (ref 8–23)
CO2: 15 mmol/L — ABNORMAL LOW (ref 22–32)
Calcium: 7.6 mg/dL — ABNORMAL LOW (ref 8.9–10.3)
Chloride: 105 mmol/L (ref 98–111)
Creatinine, Ser: 4.67 mg/dL — ABNORMAL HIGH (ref 0.61–1.24)
GFR calc Af Amer: 13 mL/min — ABNORMAL LOW (ref >60)
GFR, EST NON AFRICAN AMERICAN: 11 mL/min — AB (ref >60)
GLUCOSE: 82 mg/dL (ref 70–99)
Potassium: 4.4 mmol/L (ref 3.5–5.1)
Sodium: 138 mmol/L (ref 135–145)

## 2018-09-20 MED ORDER — GLYCOPYRROLATE 0.2 MG/ML IJ SOLN: 0.2000 mg | INTRAMUSCULAR | Status: DC | PRN

## 2018-09-20 MED ORDER — HALOPERIDOL LACTATE 5 MG/ML IJ SOLN: 1.0000 mg | INTRAMUSCULAR | Status: DC | PRN

## 2018-09-20 MED ORDER — HYDROMORPHONE HCL 2 MG PO TABS: 1.0000 mg | ORAL_TABLET | ORAL | Status: DC | PRN

## 2018-09-20 MED ORDER — HYDROMORPHONE HCL 1 MG/ML IJ SOLN
0.5000 mg | INTRAMUSCULAR | Status: DC | PRN
  Administered 2018-09-21 – 2018-09-22 (×2): 0.5 mg via INTRAVENOUS
  Filled 2018-09-20 (×2): qty 1

## 2018-09-20 MED ORDER — LORAZEPAM 2 MG/ML IJ SOLN
0.5000 mg | INTRAMUSCULAR | Status: DC | PRN
  Administered 2018-09-20 – 2018-09-23 (×2): 0.5 mg via INTRAVENOUS
  Filled 2018-09-20 (×2): qty 1

## 2018-09-20 NOTE — Progress Notes (Signed)
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 Presented 12/28 w/ weakness and a fall.  In the ED the patient was noted to be jaundiced and in ARF.  Patient was admitted and a CT of the abdomen/pelvis was done that revealed diffuse liver mets and LFTs were elevated.  Patient was given a total of 6 liters of IVF but remained hypotensive. PCCM was consulted for septic shock of unknown source.    Past Medical History  Widely metastatic prostate cancer now with liver and renal failure and circulatory shock unknown cause.  Significant Hospital Events   12/29 transfer to the ICU for presumed septic shock; abx continued. Started on peripheral pressors; seen by onc->deemed not candidate for even palliative chemo, reached out to family.  12/30: renal fxn worse, acid base worse.  12/31: Off pressors.  Procalcitonin trending down.  Still confused.  Renal function worse acid-base worse.   1/1: Off pressors, stable   Consults:  PCCM Palliative care  Procedures:  None  Significant Diagnostic Tests:  CT with widely metastatic disease 12/29  Micro Data:  Blood 12/28>>> Urine 12/28>>> MRSA PCR 12/28 negative  Antimicrobials:  Zosyn 12/28>>> Vancomycin 12/29>>> 12/31  Interim history/subjective:  Not wanting to eat this morning.   Objective   Blood pressure 114/66, pulse (!) 103, temperature 97.9 F (36.6 C), temperature source Oral, resp. rate 18, height 5\' 9"  (1.753 m), weight 100.1 kg, SpO2 95 %.        Intake/Output Summary (Last 24 hours) at 09/20/2018 0945 Last data filed at 09/20/2018 0900 Gross per 24 hour  Intake 39.98 ml  Output 10 ml  Net 29.98 ml   Filed Weights   09/16/18 1600 09/18/18 0500 09/19/18 0500  Weight:  89.2 kg 96 kg 100.1 kg    Examination: General appearance: 78 y.o., male, NAD, conversant male, NAD, conversant  Eyes: icteric eyes, jaundiced  HENT: NCAT; oropharynx, MMM Neck: Trachea midline; no LAD  Lungs: CTAB, no crackles, no wheeze CV: RRR, S1, S2, distant heart tones  Abdomen: distended, palpable liver edge  Extremities: 3+ BL LE edema Skin: Normal temperature, turgor and texture; no rash Neuro: Alert and oriented to person and place, following commands, moves all four   Resolved Hospital Problem list   Shock resolved  Assessment & Plan:   Circulatory Shock: presumed septic, this has resolved  Suspect gut translocation Plan  Continue augmentin for the time being, complete 7 days total   Lactic acidosis: with acute renal failure and liver failure.  - renal failure is continuing to worsen Acute metabolic Encephalopathy  Hypotension, sepsis syndrome resolve, now with worsening uremia  Plan Supportive care  Not candidate for RRT due to advanced metastatic cancer IVF LR 50 cc/hr   Anemia of chronic disease Hemoglobin remains stable Plan Will follow, no indication for transfusion at this time   Widely metastatic prostate cancer w/ concern about second source of malignancy from colon.  -deemed not a candidate for even palliative chemo Plan We strongly recommend hospice and palliative care  Pain control with 1mg  dilaudid   CCM will sign off and transition care to the hospitalist service. I have spoke with Dr. Loleta Books who will assume care tomorrow.   Best  practice   DVT: [place] heparin  PUD PPI Glycemic control: NA Activity: BR Diet: Regular Code status: Currently full code Disposition stable for downgrade   Lab Results  Component Value Date   NA 138 09/20/2018   K 4.4 09/20/2018   CL 105 09/20/2018   CO2 15 (L) 09/20/2018   Lab Results  Component Value Date   CREATININE 4.67 (H) 09/20/2018   BUN 58 (H) 09/20/2018   NA 138 09/20/2018   K 4.4 09/20/2018   CL 105 09/20/2018     CO2 15 (L) 09/20/2018    Garner Nash, DO [facility] 09/20/2018 9:45 AM  Personal pager: [phone] If unanswered, please page CCM On-call: [phone]

## 2018-09-20 NOTE — Treatment Plan (Signed)
Patient seen and met with patient's daughter. Appreciate assistance by PCCM. Events noted. Plans for assuming care officially 1/2. Pt's daughter aware of patient's terminal condition and has expressed desire to "keep him comfortable" and to "maintain his dignity." Currently DNR. Patient's daughter is aware that given his severity of illness, prognosis is likely within days. Appreciate assistance by Palliative Care, who plans to meet with family later. Will follow.

## 2018-09-20 NOTE — Progress Notes (Signed)
Daily Progress Note   Patient Name: Gregory Chaney       Date: 09/20/2018 DOB: 1941-02-19  Age: 78 y.o. MRN#: [id number] Attending Physician: Donne Hazel, MD Primary Care Physician: [name], NP Admit Date: 09/16/2018  Reason for Consultation/Follow-up: Establishing goals of care  Subjective: Sleeping. In no distress.  Arouses briefly but immediately falls back to sleep.   His daughter/granddaughter is at the bedside, see below:    Length of Stay: 4  Current Medications: Scheduled Meds:  . amoxicillin-clavulanate  500 mg Oral BID  . mouth rinse  15 mL Mouth Rinse BID  . pantoprazole  40 mg Oral Daily  . sodium chloride flush  10-40 mL Intracatheter Q12H  . sodium chloride flush  3 mL Intravenous Q12H    Continuous Infusions: . sodium chloride Stopped (09/18/18 0548)  . lactated ringers 50 mL/hr at 09/20/18 1300    PRN Meds: glycopyrrolate, haloperidol lactate, HYDROmorphone (DILAUDID) injection, LORazepam, ondansetron **OR** ondansetron (ZOFRAN) IV, sodium chloride flush  Physical Exam         Weak appearing gentleman Diminished S 1 S 2  His abdomen is distended Has edema He is more sleepy and confused today.  Not answer questions or follow commands.  Vital Signs: BP 101/65 (BP Location: Left Arm)   Pulse (!) 106   Temp 98 F (36.7 C) (Oral)   Resp 16   Ht 5\' 9"  (1.753 m)   Wt 99.5 kg   SpO2 99%   BMI 32.39 kg/m  SpO2: SpO2: 99 % O2 Device: O2 Device: Room Air O2 Flow Rate:    Intake/output summary:   Intake/Output Summary (Last 24 hours) at 09/20/2018 1933 Last data filed at 09/20/2018 1300 Gross per 24 hour  Intake 295.56 ml  Output 35 ml  Net 260.56 ml   LBM: Last BM Date: 09/19/18 Baseline Weight: Weight: 89.2 kg Most recent weight: Weight: 99.5  kg      PPS 40% Palliative Assessment/Data:    Flowsheet Rows     Most Recent Value  Intake Tab  Referral Department  Hospitalist  Unit at Time of Referral  ICU  Palliative Care Primary Diagnosis  Cancer  Date Notified  09/16/18  Palliative Care Type  New Palliative care  Reason for referral  Clarify Goals of Care  Date of Admission  09/16/18  Date first seen by Palliative Care  09/17/18  # of days Palliative referral response time  1 Day(s)  # of days IP prior to Palliative referral  0  Clinical Assessment  Psychosocial & Spiritual Assessment  Palliative Care Outcomes      Patient Active Problem List   Diagnosis Date Noted  . AKI (acute kidney injury) ([place])   . Palliative care by specialist   . Goals of care, counseling/discussion   . Lactic acidosis 09/16/2018  . Dysphagia 09/16/2018  . Colonic mass 09/16/2018  . Hepatic metastases ([place]) 09/16/2018  . Elevated troponin 09/16/2018  . Acute renal failure ([place]) 09/16/2018  . Hyperbilirubinemia 09/16/2018  . 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 Bone metastases ([place]) 10/26/2016  . Prostate CA ([place]) 04/23/2014  . Obesity 04/23/2014  . Prostate cancer metastatic to bone ([place]) 04/23/2014  . Hematuria 04/23/2014    Palliative Care Assessment & Plan   Patient Profile:    Assessment:  - 78 y.o., male admitted on 09/16/2018 for hypotension shock, unknown source however known metastatic prostate cancer as well as new CT imaging concerning for metastatic colon cancer, large sigmoid lesion as well as multiple hepatic metastasis. - Patient remains admitted with shock and multi organ failure. High risk for ongoing decline, decompensation and death. High possibility of the patient not surviving this hospitalization. This was discussed with patient's HCPOA daughter [name] over the phone by Drs. Anwar and Icard.  She is working to get to [place].  Her flight has  been delayed/rerouted due to weather.  Per her hother in Sports coach, she is currently stuck in [place] with plan to [place] via [place].  Hopeful top arrive late this evening.  Recommendations/Plan: -DNR/DNI.    -Continue current interventions (oral antibiotics and gentle hydration) but daughter understands that he is transitioning to actively dying and goal is to make sure he does not suffer.  We began discussion of how current interventions could be reaching point where they are causing more burden than benefit.  Moving toward full comfort care and medication should be given to ensure his comfort regardless of side effects such as hypotension or respiratory depression.  I also made addition of IV medications for use for pain, shortness of breath, anxiety, agitation, or excess secretions.  -We began discussion regarding residential hospice.  -PMT will follow up tomorrow.     Code Status:    Code Status Orders  (From admission, onward)         Start     Ordered   09/16/18 1648  Full code  Continuous     09/16/18 1647        Code Status History    Date Active Date Inactive Code Status Order ID Comments User Context   03/09/2017 2012 03/10/2017 2013 DNR [id number]  Jule Ser, DO ED       Prognosis:   guarded   Discharge Planning: Anticipated hospital death versus transfer to residential hospice.  Care plan was discussed with  Patient, PCCM staff.   Thank you for allowing the Palliative Medicine Team to assist in the care of this patient.   Total Time 55 Prolonged Time Billed  no       Greater than 50%  of this time was spent counseling and coordinating care related to the above assessment and plan.  Micheline Rough, MD  Please contact Palliative Medicine Team phone at [phone] for questions and concerns.

## 2018-09-20 NOTE — Progress Notes (Signed)
PCCM:  Discussion with Daughter HCPOA at bedside. Decisions made for full DDNR and transition to comfort measures as needed.   I will update the hospitalist. Ok to transfer from the ICU.   Garner Nash, DO [facility] 09/20/2018 12:10 PM  Personal pager: [phone] If unanswered, please page CCM On-call: [phone]

## 2018-09-21 DIAGNOSIS — R945 Abnormal results of liver function studies: Secondary | ICD-10-CM

## 2018-09-21 LAB — CULTURE, BLOOD (ROUTINE X 2)
Culture: NO GROWTH
Culture: NO GROWTH
Special Requests: ADEQUATE

## 2018-09-21 NOTE — Progress Notes (Signed)
PT Cancellation Note  Patient Details Name: Gregory Chaney MRN: [id number] DOB: 1941-06-04   Cancelled Treatment:    Reason Eval/Treat Not Completed: Other (comment)GOC meeting still pending. Noted potential residential hospice. Will follow up after meeting   [facility] 09/21/2018, 1:53 PM

## 2018-09-21 NOTE — Care Management Important Message (Signed)
Important Message  Patient Details  Name: Gregory Chaney MRN: [id number] Date of Birth: November 19, 1940   Medicare Important Message Given:  Yes    Kerin Salen 09/21/2018, 9:52 AMImportant Message  Patient Details  Name: Gregory Chaney MRN: [id number] Date of Birth: [birth year]   Medicare Important Message Given:  Yes    Kerin Salen 09/21/2018, 9:52 AM

## 2018-09-21 NOTE — Progress Notes (Signed)
OT Cancellation Note  Patient Details Name: Gregory Chaney MRN: [id number] DOB: Feb 07, 1941   Cancelled Treatment:    Reason Eval/Treat Not Completed: Other (comment).  [place] meeting still pending. Noted potential residential hospice. Will follow up after meeting  Teneshia Hedeen 09/21/2018, 12:54 PM  Lesle Chris, OTR/L Acute Rehabilitation Services [phone] [facility] pager [phone] office 09/21/2018

## 2018-09-21 NOTE — Progress Notes (Signed)
PROGRESS NOTE    Gregory Chaney  FBP:[id number] DOB: 05-29-1941 DOA: 09/16/2018 PCP: [name], NP    Brief Narrative:  [phone] presented with shock with metastatic prostate cancer and new findings concerning for metastatic colon cancer. 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 Septic shock 1. Suspected septic shock from unclear source 2. Required aggressive IVF hydration and pressor support 3. See below, now DNR and possible transition to comfort in the near future if no significant improvement 4. Continued on empiric broad spec abx for now 2. Lactic acidosis 1. Likely secondary to septic shock 3. Acute metabolic encephalopathy 1. Appears more drowsy this afternoon 2. Suspect component of worsening uremia 3. Per below, DNR with likely focus on more comfort measures in the near future 4. Metastatic prostate and colon cancer 1. Likely not candidate for chemo or aggressive tx 2. Continue supportive care  5. Anemia of chronic disease 1. No evidence of acute bleed 2. Cont with supportive care 6. ARF 1. Worsening renal function noted despite maximal therapy 2. Minimal urine output noted 3. Suspect worsening uremia 7. End of life 1. See previous note. Daughter from [name] arrived and updated. Wishes noted for DNR. Daughter states pt's wishes would be for focus to "maintain his dignity" and for comfort. 2. Appreciate input by Palliative Care. Plan to continue current course of abx and gentle hydration. If no significant improvement, then possibility for transition to full  comfort/residential hospice. Hospital death is possible as well  DVT prophylaxis: Heparin discontinued Code Status: DNR Family Communication: Pt in room Disposition Plan: Uncertain at this time  Consultants:   PCCM  Palliative Care  Procedures:     Antimicrobials: Anti-infectives (From admission, onward)   Start     Dose/Rate Route Frequency Ordered Stop   09/19/18 1145  amoxicillin-clavulanate (AUGMENTIN) 500-125 MG per tablet 500 mg     500 mg Oral 2 times daily 09/19/18 1139     09/19/18 1130  amoxicillin-clavulanate (AUGMENTIN) 875-125 MG per tablet 1 tablet  Status:  Discontinued     1 tablet Oral Every 12 hours 09/19/18 1130 09/19/18 1139   09/19/18 1000  piperacillin-tazobactam (ZOSYN) IVPB 2.25 g  Status:  Discontinued     2.25 g 100 mL/hr over 30 Minutes Intravenous Every 8 hours 09/19/18 0943 09/19/18 1130   09/18/18 1400  piperacillin-tazobactam (ZOSYN) IVPB 2.25 g  Status:  Discontinued     2.25 g 100 mL/hr over 30 Minutes Intravenous Every 6 hours 09/18/18 1022 09/19/18 0943   09/18/18 1022  vancomycin variable dose per unstable renal function (pharmacist dosing)  Status:  Discontinued      Does not apply See admin instructions 09/18/18 1022 09/19/18 0849   09/17/18 2000  vancomycin (VANCOCIN) 1,500 mg in sodium chloride 0.9 % 500 mL IVPB     1,500 mg 250 mL/hr over 120 Minutes Intravenous  Once 09/17/18 1859 09/17/18 2214   09/16/18 2000  piperacillin-tazobactam (ZOSYN) IVPB 3.375 g  Status:  Discontinued     3.375 g 12.5 mL/hr over 240 Minutes Intravenous Every 8 hours  09/16/18 1655 09/18/18 1022   09/16/18 1100  vancomycin (VANCOCIN) IVPB 1000 mg/200 mL premix     1,000 mg 200 mL/hr over 60 Minutes Intravenous  Once 09/16/18 1049 09/16/18 1320   09/16/18 1100  piperacillin-tazobactam (ZOSYN) IVPB 3.375 g     3.375 g 12.5 mL/hr over 240 Minutes Intravenous  Once 09/16/18 1049 09/16/18 1535       Subjective: Confused  Objective: Vitals:   09/20/18  1427 09/20/18 2207 09/21/18 0622 09/21/18 1323  BP: 101/65 (!) 98/59 102/68 (!) 99/55  Pulse: (!) 106 83 79 82  Resp: 16 16 17 18   Temp: 98 F (36.7 C) (!) 97.5 F (36.4 C) 98 F (36.7 C) 97.8 F (36.6 C)  TempSrc: Oral Oral Oral Oral  SpO2: 99% 96% 97% 96%  Weight: 99.5 kg     Height:        Intake/Output Summary (Last 24 hours) at 09/21/2018 1755 Last data filed at 09/21/2018 1700 Gross per 24 hour  Intake 1169.7 ml  Output -  Net 1169.7 ml   Filed Weights   09/18/18 0500 09/19/18 0500 09/20/18 1427  Weight: 96 kg 100.1 kg 99.5 kg    Examination: General exam: Appears calm and comfortable  Respiratory system: Clear to auscultation. Respiratory effort normal. Cardiovascular system: S1 & S2 heard, RRR. No JVD, murmurs, rubs, gallops or clicks. No pedal edema. Gastrointestinal system: Abdomen is nondistended, soft and nontender. No organomegaly or masses felt. Normal bowel sounds heard. Central nervous system: Alert and oriented. No focal neurological deficits. Extremities: Symmetric 5 x 5 power. Skin: No rashes, lesions or ulcers Psychiatry: confused, affect appears normal  Data Reviewed: I have personally reviewed following labs and imaging studies  CBC: Recent Labs  Lab 09/16/18 1038 09/17/18 0518 09/18/18 0704 09/19/18 0315  WBC 12.5* 12.7* 12.8* 13.1*  NEUTROABS 10.1*  --  10.7* 10.5*  HGB 10.0* 7.7* 8.6* 8.6*  HCT 29.5* 21.5* 25.8*  24.1* 25.7*  MCV 67.0* 63.4* 66.5* 64.9*  PLT 214 152 192 884   Basic Metabolic Panel: Recent Labs  Lab 09/16/18 1038 09/17/18 0518 09/18/18 0704 09/19/18 0315 09/20/18 0323  NA 137 137 137 138 138  K 3.5 4.3 4.0 4.1 4.4  CL 99 103 103 104 105  CO2 18* 21* 17* 16* 15*  GLUCOSE 104* 89 86 128* 82  BUN 41* 43* 49* 55* 58*  CREATININE 2.75* 2.95* 3.56* 4.03* 4.67*  CALCIUM 8.7* 7.8* 7.6* 7.7* 7.6*  MG  --   --  2.3 2.1  --   PHOS  --   --  5.9*  --   --    GFR: Estimated Creatinine Clearance: 15.6 mL/min (A) (by C-G  formula based on SCr of 4.67 mg/dL (H)). Liver Function Tests: Recent Labs  Lab 09/16/18 1038 09/17/18 0518 09/18/18 0704 09/19/18 0315  AST 233* 174* 159* 111*  ALT 74* 56* 67* 64*  ALKPHOS 384* 260* 272* 255*  BILITOT 13.0* 8.7* 9.3* 9.1*  PROT 7.5 5.4* 6.2* 6.0*  ALBUMIN 2.0* 1.5* 1.6* 1.6*   Recent Labs  Lab 09/16/18 1038  LIPASE 40   Recent Labs  Lab 09/16/18 1158 09/18/18 0704  AMMONIA 44* 26   Coagulation Profile: Recent Labs  Lab 09/16/18 1036  INR 1.48   Cardiac Enzymes: Recent Labs  Lab 09/16/18 1704 09/16/18 2326 09/17/18 0518 09/18/18 0704  CKTOTAL 1,838*  --   --  942*  TROPONINI 0.04* 0.03* 0.03*  --    BNP (last 3 results) No  results for input(s): PROBNP in the last 8760 hours. HbA1C: No results for input(s): HGBA1C in the last 72 hours. CBG: No results for input(s): GLUCAP in the last 168 hours. Lipid Profile: No results for input(s): CHOL, HDL, LDLCALC, TRIG, CHOLHDL, LDLDIRECT in the last 72 hours. Thyroid Function Tests: No results for input(s): TSH, T4TOTAL, FREET4, T3FREE, THYROIDAB in the last 72 hours. Anemia Panel: No results for input(s): VITAMINB12, FOLATE, FERRITIN, TIBC, IRON, RETICCTPCT in the last 72 hours. Sepsis Labs: Recent Labs  Lab 09/16/18 1041 09/16/18 1237 09/17/18 1855 09/18/18 0704 09/19/18 0315  PROCALCITON  --   --  13.17 13.03 12.56  LATICACIDVEN 7.76* 8.08*  --   --   --     Recent Results (from the past 240 hour(s))  Blood Culture (routine x 2)     Status: None   Collection Time: 09/16/18 10:38 AM  Result Value Ref Range Status   Specimen Description   Final    BLOOD RIGHT HAND Performed at [facility] [address] [place], [place] [address]    Special Requests   Final    BOTTLES DRAWN AEROBIC AND ANAEROBIC Blood Culture results may not be optimal due to an inadequate volume of blood received in culture bottles Performed at [facility] [address] [place], [place] [address]    Culture   Final    NO GROWTH 5 DAYS Performed at [facility], [place] [address], [place], [place] [address]    Report Status 09/21/2018 FINAL  Final  Blood Culture (routine x 2)     Status: None   Collection Time: 09/16/18 10:38 AM  Result Value Ref Range Status   Specimen Description   Final    BLOOD ARM Performed at [facility] [address] [place], [place] [address]    Special Requests   Final    BOTTLES DRAWN AEROBIC AND ANAEROBIC Blood Culture adequate volume Performed at [facility] [address] [place], [place] [address]    Culture   Final    NO GROWTH 5 DAYS Performed at [facility], [place] [address], [place], [place] [address]    Report Status 09/21/2018 FINAL  Final  Urine culture     Status: None   Collection Time: 09/16/18 10:38 AM  Result Value Ref Range Status   Specimen Description URINE, RANDOM  Final   Special Requests   Final    NONE Performed at [facility] [address], [place], [place] [address]    Culture NO GROWTH  Final   Report Status 09/18/2018 FINAL  Final  MRSA PCR Screening     Status: None   Collection Time: 09/16/18  3:54 PM  Result Value Ref Range Status   MRSA by PCR NEGATIVE NEGATIVE Final    Comment:        The GeneXpert MRSA Assay (FDA approved for NASAL specimens only), is one component of a comprehensive MRSA colonization surveillance program. It is not intended to diagnose MRSA infection nor to guide or monitor treatment for MRSA infections. Performed at [facility], [place] [address] [place],  [address]      Radiology Studies: No results found.  Scheduled Meds: . amoxicillin-clavulanate  500 mg Oral BID  . mouth rinse  15 mL Mouth Rinse BID  . pantoprazole  40 mg Oral Daily  . sodium chloride flush  10-40 mL Intracatheter Q12H  . sodium  chloride flush  3 mL Intravenous Q12H    Continuous Infusions: . sodium chloride Stopped (09/18/18 0548)  . lactated ringers 50 mL/hr at 09/21/18 0354     LOS: 5 days   Marylu Lund, MD [facility] Pager On Amion  If 7PM-7AM, please contact night-coverage 09/21/2018, 5:55 PM

## 2018-09-21 NOTE — Progress Notes (Signed)
Daily Progress Note   Patient Name: Gregory Chaney       Date: 09/21/2018 DOB: 1941-04-09  Age: 78 y.o. MRN#: [id number] Attending Physician: Donne Hazel, MD Primary Care Physician: [name], NP Admit Date: 09/16/2018  Reason for Consultation/Follow-up: Establishing goals of care  Subjective: Sleeping. In no distress.  Arouses briefly but immediately falls back to sleep.   His daughter/granddaughter is at the bedside, see below:    Length of Stay: 5  Current Medications: Scheduled Meds:  . amoxicillin-clavulanate  500 mg Oral BID  . mouth rinse  15 mL Mouth Rinse BID  . pantoprazole  40 mg Oral Daily  . sodium chloride flush  10-40 mL Intracatheter Q12H  . sodium chloride flush  3 mL Intravenous Q12H    Continuous Infusions: . sodium chloride Stopped (09/18/18 0548)  . lactated ringers 50 mL/hr at 09/21/18 0354    PRN Meds: glycopyrrolate, haloperidol lactate, HYDROmorphone (DILAUDID) injection, LORazepam, ondansetron **OR** ondansetron (ZOFRAN) IV, sodium chloride flush  Physical Exam         Weak appearing gentleman. More interactive today. Diminished S 1 S 2  His abdomen is distended + edema  Vital Signs: BP 102/68 (BP Location: Left Arm)   Pulse 79   Temp 98 F (36.7 C) (Oral)   Resp 17   Ht 5\' 9"  (1.753 m)   Wt 99.5 kg   SpO2 97%   BMI 32.39 kg/m  SpO2: SpO2: 97 % O2 Device: O2 Device: Room Air O2 Flow Rate:    Intake/output summary:   Intake/Output Summary (Last 24 hours) at 09/21/2018 1124 Last data filed at 09/21/2018 0801 Gross per 24 hour  Intake 1269.64 ml  Output 35 ml  Net 1234.64 ml   LBM: Last BM Date: 09/19/18 Baseline Weight: Weight: 89.2 kg Most recent weight: Weight: 99.5 kg      PPS 40% Palliative  Assessment/Data:    Flowsheet Rows     Most Recent Value  Intake Tab  Referral Department  Hospitalist  Unit at Time of Referral  ICU  Palliative Care Primary Diagnosis  Cancer  Date Notified  09/16/18  Palliative Care Type  New Palliative care  Reason for referral  Clarify Goals of Care  Date of Admission  09/16/18  Date first seen by Palliative Care  09/17/18  #  of days Palliative referral response time  1 Day(s)  # of days IP prior to Palliative referral  0  Clinical Assessment  Psychosocial & Spiritual Assessment  Palliative Care Outcomes      Patient Active Problem List   Diagnosis Date Noted  . AKI (acute kidney injury) ([place])   . Palliative care by specialist   . Goals of care, counseling/discussion   . Lactic acidosis 09/16/2018  . Dysphagia 09/16/2018  . Colonic mass 09/16/2018  . Hepatic metastases ([place]) 09/16/2018  . Elevated troponin 09/16/2018  . Acute renal failure ([place]) 09/16/2018  . Hyperbilirubinemia 09/16/2018  . 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 Bone metastases ([place]) 10/26/2016  . Prostate CA ([place]) 04/23/2014  . Obesity 04/23/2014  . Prostate cancer metastatic to bone ([place]) 04/23/2014  . Hematuria 04/23/2014    Palliative Care Assessment & Plan   Patient Profile:    Assessment:  - 78 y.o., male admitted on 09/16/2018 for hypotension shock, unknown source however known metastatic prostate cancer as well as new CT imaging concerning for metastatic colon cancer, large sigmoid lesion as well as multiple hepatic metastasis. - Patient remains admitted with shock and multi organ failure. High risk for ongoing decline, decompensation and death. High possibility of the patient not surviving this hospitalization. This was discussed with patient's HCPOA daughter [name] over the phone by Drs. Anwar and Icard.  She is working to get to [place].  Her flight has been delayed/rerouted due to  weather.  Per her hother in Sports [employer], she is currently stuck in [place] with plan to [place] via [place].  Hopeful top arrive late this evening.  Recommendations/Plan: -DNR/DNI.    -Continue current interventions (oral antibiotics and gentle hydration).  Moving toward full comfort care and medication should be given to ensure his comfort regardless of side effects such as hypotension or respiratory depression.  He is a little more alert today.  Plan for f/u tomorrow as I am concerned this may be a "rally" before the acutely worsens.  -We began discussion regarding residential hospice yesterday.  Plan to reassess tomorrow.   -PMT will follow up tomorrow.     Code Status:    Code Status Orders  (From admission, onward)         Start     Ordered   09/16/18 1648  Full code  Continuous     09/16/18 1647        Code Status History    Date Active Date Inactive Code Status Order ID Comments User Context   03/09/2017 2012 03/10/2017 2013 DNR [id number]  Jule Ser, DO ED       Prognosis:   Likely < 2 weeks based on renal failure  Discharge Planning: Anticipated hospital death versus transfer to residential hospice.  Care plan was discussed with  Patient, PCCM staff.   Thank you for allowing the Palliative Medicine Team to assist in the care of this patient.   Total Time 20 Prolonged Time Billed  no       Greater than 50%  of this time was spent counseling and coordinating care related to the above assessment and plan.  Micheline Rough, MD  Please contact Palliative Medicine Team phone at [phone] for questions and concerns.

## 2018-09-21 NOTE — Progress Notes (Signed)
Pt's daughter Blair Dolphin has expressed that she does not want her father to receive dilaudid for any reason. She states that he is not in pain and is not to receive meds "just because". I asked if she would like me to give her a call should he start to show signs of pain, SOB, etc, before giving any medication and she said that would be fine. Pt appears relaxed and comfortable at this time. Hortencia Conradi RN

## 2018-09-22 NOTE — Progress Notes (Signed)
OT Cancellation Note  Patient Details Name: Gregory Chaney MRN: [id number] DOB: 03-03-1941   Cancelled Treatment:    Reason Eval/Treat Not Completed: Other (comment).  Noted plan is for comfort care.  Will sign off.  Soumya Colson 09/22/2018, 7:18 AM  Lesle Chris, OTR/L Acute Rehabilitation Services [phone] [facility] pager [phone] office 09/22/2018

## 2018-09-22 NOTE — Progress Notes (Signed)
PROGRESS NOTE    Gregory Chaney  NGE:[id number] DOB: 1941/08/23 DOA: 09/16/2018 PCP: [name], NP    Brief Narrative:  [phone] presented with shock with metastatic prostate cancer and new findings concerning for metastatic colon cancer. 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 Septic shock 1. Suspected septic shock from unclear source 2. Required aggressive IVF hydration and pressor support 3. See below, now DNR and possible transition to comfort in the near future if no significant improvement 4. Pt is cont on empiric abx 2. Lactic acidosis 1. Likely secondary to septic shock 3. Acute metabolic encephalopathy 1. Appears more drowsy this afternoon 2. Suspect component of worsening uremia 3. Per below, DNR with likely focus on more comfort measures in the near future 4. Metastatic prostate and colon cancer 1. Likely not candidate for chemo or aggressive tx 2. Continue supportive care  5. Anemia of chronic disease 1. No evidence of acute bleed 2. Cont with supportive care 6. ARF 1. Worsening renal function noted despite maximal therapy 2. Minimal urine output noted despite IVF 3. Prognosis regarding end stage renal failure alone would be less than 2 weeks 7. End of life 1. See previous note. Daughter from [facility] arrived and updated. Wishes noted for DNR. Daughter states pt's wishes would be for focus to "maintain his dignity" and for comfort. 2. Appreciate input by Palliative Care. Plan to continue current course of abx and gentle hydration. If no significant improvement, then  possibility for transition to full comfort/residential hospice. Hospital death is possible  DVT prophylaxis: Heparin discontinued Code Status: DNR Family Communication: Pt in room, family at bedside Disposition Plan: Uncertain at this time  Consultants:   PCCM  Palliative Care  Procedures:     Antimicrobials: Anti-infectives (From admission, onward)   Start     Dose/Rate Route Frequency Ordered Stop   09/19/18 1145  amoxicillin-clavulanate (AUGMENTIN) 500-125 MG per tablet 500 mg     500 mg Oral 2 times daily 09/19/18 1139     09/19/18 1130  amoxicillin-clavulanate (AUGMENTIN) 875-125 MG per tablet 1 tablet  Status:  Discontinued     1 tablet Oral Every 12 hours 09/19/18 1130 09/19/18 1139   09/19/18 1000  piperacillin-tazobactam (ZOSYN) IVPB 2.25 g  Status:  Discontinued     2.25 g 100 mL/hr over 30 Minutes Intravenous Every 8 hours 09/19/18 0943 09/19/18 1130   09/18/18 1400  piperacillin-tazobactam (ZOSYN) IVPB 2.25 g  Status:  Discontinued     2.25 g 100 mL/hr over 30 Minutes Intravenous Every 6 hours 09/18/18 1022 09/19/18 0943   09/18/18 1022  vancomycin variable dose per unstable renal function (pharmacist dosing)  Status:  Discontinued      Does not apply See admin instructions 09/18/18 1022 09/19/18 0849   09/17/18 2000  vancomycin (VANCOCIN) 1,500 mg in sodium chloride 0.9 % 500 mL IVPB     1,500 mg 250 mL/hr over 120 Minutes Intravenous  Once 09/17/18 1859 09/17/18 2214   09/16/18 2000  piperacillin-tazobactam (ZOSYN) IVPB 3.375 g  Status:  Discontinued  3.375 g 12.5 mL/hr over 240 Minutes Intravenous Every 8 hours 09/16/18 1655 09/18/18 1022   09/16/18 1100  vancomycin (VANCOCIN) IVPB 1000 mg/200 mL premix     1,000 mg 200 mL/hr over 60 Minutes Intravenous  Once 09/16/18 1049 09/16/18 1320   09/16/18 1100  piperacillin-tazobactam (ZOSYN) IVPB 3.375 g     3.375 g 12.5 mL/hr over 240 Minutes Intravenous  Once 09/16/18 1049 09/16/18 1535       Subjective: Feeling more alert and talkative today  Objective: Vitals:   09/21/18 1323 09/21/18 2213 09/22/18 0609 09/22/18 1440  BP: (!) 99/55 101/69 106/70 (!) 92/57  Pulse: 82 78 88 91  Resp: 18 16 16 16   Temp: 97.8 F (36.6 C) 97.9 F (36.6 C) 98 F (36.7 C) 98.2 F (36.8 C)  TempSrc: Oral Oral Oral Oral  SpO2: 96% 99% 97% 92%  Weight:   98.7 kg   Height:        Intake/Output Summary (Last 24 hours) at 09/22/2018 1832 Last data filed at 09/22/2018 1744 Gross per 24 hour  Intake 1371.1 ml  Output 150 ml  Net 1221.1 ml   Filed Weights   09/19/18 0500 09/20/18 1427 09/22/18 0609  Weight: 100.1 kg 99.5 kg 98.7 kg    Examination: General exam: Awake, sitting up in bed, in nad Respiratory system: Normal respiratory effort, no wheezing Cardiovascular system: regular rate, s1, s2, BLE edema  Data Reviewed: I have personally reviewed following labs and imaging studies  CBC: Recent Labs  Lab 09/16/18 1038 09/17/18 0518 09/18/18 0704 09/19/18 0315  WBC 12.5* 12.7* 12.8* 13.1*  NEUTROABS 10.1*  --  10.7* 10.5*  HGB 10.0* 7.7* 8.6* 8.6*  HCT 29.5* 21.5* 25.8*  24.1* 25.7*  MCV 67.0* 63.4* 66.5* 64.9*  PLT 214 152 192 903   Basic Metabolic Panel: Recent Labs  Lab 09/16/18 1038 09/17/18 0518 09/18/18 0704 09/19/18 0315 09/20/18 0323  NA 137 137 137 138 138  K 3.5 4.3 4.0 4.1 4.4  CL 99 103 103 104 105  CO2 18* 21* 17* 16* 15*  GLUCOSE 104* 89 86 128* 82  BUN 41* 43* 49* 55* 58*  CREATININE 2.75* 2.95* 3.56* 4.03* 4.67*  CALCIUM 8.7* 7.8* 7.6* 7.7* 7.6*  MG  --   --  2.3 2.1  --   PHOS  --   --  5.9*  --   --    GFR: Estimated Creatinine Clearance: 15.6 mL/min (A) (by C-G formula based on SCr of 4.67 mg/dL (H)). Liver Function Tests: Recent Labs  Lab 09/16/18 1038 09/17/18 0518 09/18/18 0704 09/19/18 0315  AST 233* 174* 159* 111*  ALT 74* 56* 67* 64*  ALKPHOS 384* 260* 272* 255*  BILITOT 13.0* 8.7* 9.3* 9.1*  PROT 7.5 5.4* 6.2* 6.0*   ALBUMIN 2.0* 1.5* 1.6* 1.6*   Recent Labs  Lab 09/16/18 1038  LIPASE 40   Recent Labs  Lab 09/16/18 1158 09/18/18 0704  AMMONIA 44* 26   Coagulation Profile: Recent Labs  Lab 09/16/18 1036  INR 1.48   Cardiac Enzymes: Recent Labs  Lab 09/16/18 1704 09/16/18 2326 09/17/18 0518 09/18/18 0704  CKTOTAL 1,838*  --   --  942*  TROPONINI 0.04* 0.03* 0.03*  --    BNP (last 3 results) No results for input(s): PROBNP in the last 8760 hours. HbA1C: No results for input(s): HGBA1C in the last 72 hours. CBG: No results for input(s): GLUCAP in the last 168 hours. Lipid Profile: No results for input(s): CHOL,  HDL, LDLCALC, TRIG, CHOLHDL, LDLDIRECT in the last 72 hours. Thyroid Function Tests: No results for input(s): TSH, T4TOTAL, FREET4, T3FREE, THYROIDAB in the last 72 hours. Anemia Panel: No results for input(s): VITAMINB12, FOLATE, FERRITIN, TIBC, IRON, RETICCTPCT in the last 72 hours. Sepsis Labs: Recent Labs  Lab 09/16/18 1041 09/16/18 1237 09/17/18 1855 09/18/18 0704 09/19/18 0315  PROCALCITON  --   --  13.17 13.03 12.56  LATICACIDVEN 7.76* 8.08*  --   --   --     Recent Results (from the past 240 hour(s))  Blood Culture (routine x 2)     Status: None   Collection Time: 09/16/18 10:38 AM  Result Value Ref Range Status   Specimen Description   Final    BLOOD RIGHT HAND Performed at [facility] [address] [place], [place] [address]    Special Requests   Final    BOTTLES DRAWN AEROBIC AND ANAEROBIC Blood Culture results may not be optimal due to an inadequate volume of blood received in culture bottles Performed at [facility] [address], [place], [place] [address]    Culture   Final    NO GROWTH 5 DAYS Performed at [facility], [place] [address], [place], [place] [address]    Report Status 09/21/2018 FINAL  Final  Blood Culture (routine x 2)     Status: None   Collection Time: 09/16/18 10:38 AM   Result Value Ref Range Status   Specimen Description   Final    BLOOD ARM Performed at [facility] [address], [place], [place] [address]    Special Requests   Final    BOTTLES DRAWN AEROBIC AND ANAEROBIC Blood Culture adequate volume Performed at [facility] [address] [place], [place] [address]    Culture   Final    NO GROWTH 5 DAYS Performed at [facility], [place] [address], [place], [place] [address]    Report Status 09/21/2018 FINAL  Final  Urine culture     Status: None   Collection Time: 09/16/18 10:38 AM  Result Value Ref Range Status   Specimen Description URINE, RANDOM  Final   Special Requests   Final    NONE Performed at [facility] [address], [place],  [address]    Culture NO GROWTH  Final   Report Status 09/18/2018 FINAL  Final  MRSA PCR Screening     Status: None   Collection Time: 09/16/18  3:54 PM  Result Value Ref Range Status   MRSA by PCR NEGATIVE NEGATIVE Final    Comment:        The GeneXpert MRSA Assay (FDA approved for NASAL specimens only), is one component of a comprehensive MRSA colonization surveillance program. It is not intended to diagnose MRSA infection nor to guide or monitor treatment for MRSA infections. Performed at [facility], [place] [address] [place],  [address]      Radiology Studies: No results found.  Scheduled Meds: . amoxicillin-clavulanate  500 mg Oral BID  . mouth rinse  15 mL Mouth Rinse BID  . pantoprazole  40 mg Oral Daily  . sodium chloride flush  10-40 mL Intracatheter Q12H  . sodium chloride flush  3 mL Intravenous Q12H   Continuous Infusions: . sodium chloride 250 mL (09/22/18 1008)  . lactated ringers 50 mL/hr at 09/22/18 0612     LOS: 6 days   Marylu Lund,  MD [facility] Pager On Amion  If 7PM-7AM, please contact night-coverage 09/22/2018, 6:32 PM

## 2018-09-22 NOTE — Progress Notes (Signed)
PT Cancellation Note  Patient Details Name: Gregory Chaney MRN: [id number] DOB: 05/22/1941   Cancelled Treatment:    Reason Eval/Treat Not Completed: Other (comment)  Noted plan is for comfort care.  Will sign off.    [facility] 09/22/2018, 11:15 AM

## 2018-09-22 NOTE — Progress Notes (Signed)
Daily Progress Note   Patient Name: Gregory Chaney       Date: 09/22/2018 DOB: 13-Jun-1941  Age: 78 y.o. MRN#: [id number] Attending Physician: Donne Hazel, MD Primary Care Physician: [name], NP Admit Date: 09/16/2018  Reason for Consultation/Follow-up: Establishing goals of care  Subjective: Patient more awake today.  His daughter and her son are present today.  Patient on edge of bed and asking to stand to stretch his legs.  His daughter and I discussed care plan.  She reports understanding that he is ill, but based on his appearance today, she states that she is wondering if he will improve.    We discussed poor urine output and that this is likely temporary improvement.  I told her I would call and discuss with Dr. Wyline Copas regarding rechecking labs per her request.   Length of Stay: 6  Current Medications: Scheduled Meds:  . amoxicillin-clavulanate  500 mg Oral BID  . mouth rinse  15 mL Mouth Rinse BID  . pantoprazole  40 mg Oral Daily  . sodium chloride flush  10-40 mL Intracatheter Q12H  . sodium chloride flush  3 mL Intravenous Q12H    Continuous Infusions: . sodium chloride 250 mL (09/22/18 1008)  . lactated ringers 50 mL/hr at 09/22/18 0612    PRN Meds: glycopyrrolate, haloperidol lactate, HYDROmorphone (DILAUDID) injection, LORazepam, ondansetron **OR** ondansetron (ZOFRAN) IV, sodium chloride flush  Physical Exam         Weak appearing gentleman. More interactive today.  Siting on edge of bed. Diminished S 1 S 2  His abdomen is distended + edema  Vital Signs: BP (!) 92/57 (BP Location: Left Arm)   Pulse 91   Temp 98.2 F (36.8 C) (Oral)   Resp 16   Ht 5\' 9"  (1.753 m)   Wt 98.7 kg   SpO2 92%   BMI 32.13 kg/m  SpO2: SpO2: 92 % O2 Device: O2 Device:  Room Air O2 Flow Rate:    Intake/output summary:   Intake/Output Summary (Last 24 hours) at 09/22/2018 1828 Last data filed at 09/22/2018 1744 Gross per 24 hour  Intake 1371.1 ml  Output 150 ml  Net 1221.1 ml   LBM: Last BM Date: 09/21/18 Baseline Weight: Weight: 89.2 kg Most recent weight: Weight: 98.7 kg      PPS 40%  Palliative Assessment/Data:    Flowsheet Rows     Most Recent Value  Intake Tab  Referral Department  Hospitalist  Unit at Time of Referral  ICU  Palliative Care Primary Diagnosis  Cancer  Date Notified  09/16/18  Palliative Care Type  New Palliative care  Reason for referral  Clarify Goals of Care  Date of Admission  09/16/18  Date first seen by Palliative Care  09/17/18  # of days Palliative referral response time  1 Day(s)  # of days IP prior to Palliative referral  0  Clinical Assessment  Psychosocial & Spiritual Assessment  Palliative Care Outcomes      Patient Active Problem List   Diagnosis Date Noted  . AKI (acute kidney injury) ([place])   . Palliative care by specialist   . Goals of care, counseling/discussion   . Lactic acidosis 09/16/2018  . Dysphagia 09/16/2018  . Colonic mass 09/16/2018  . Hepatic metastases ([place]) 09/16/2018  . Elevated troponin 09/16/2018  . Acute renal failure ([place]) 09/16/2018  . Hyperbilirubinemia 09/16/2018  . 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 Bone metastases ([place]) 10/26/2016  . Prostate CA ([place]) 04/23/2014  . Obesity 04/23/2014  . Prostate cancer metastatic to bone ([place]) 04/23/2014  . Hematuria 04/23/2014    Palliative Care Assessment & Plan   Patient Profile:    Assessment:  - 78 y.o., male admitted on 09/16/2018 for hypotension shock, unknown source however known metastatic prostate cancer as well as new CT imaging concerning for metastatic colon cancer, large sigmoid lesion as well as multiple hepatic metastasis. - Patient remains admitted with  shock and multi organ failure. High risk for ongoing decline, decompensation and death. High possibility of the patient not surviving this hospitalization. This was discussed with patient's HCPOA daughter [name] over the phone by Drs. Anwar and Icard.  She is working to get to [place].  Her flight has been delayed/rerouted due to weather.  Per her hother in Sports [employer], she is currently stuck in [place] with plan to [place] via [place].  Hopeful top arrive late this evening.  Recommendations/Plan: -DNR/DNI.    -Continue current interventions (oral antibiotics and gentle hydration).  [name]  He is more alert today.  Plan for f/u tomorrow as I am concerned this may be a "rally" before the acutely worsens.  -We began discussion regarding residential hospice yesterday.  Plan to reassess tomorrow.   -PMT will follow up tomorrow.     Code Status:    Code Status Orders  (From admission, onward)         Start     Ordered   09/16/18 1648  Full code  Continuous     09/16/18 1647        Code Status History    Date Active Date Inactive Code Status Order ID Comments User Context   03/09/2017 2012 03/10/2017 2013 DNR [id number]  Jule Ser, DO ED       Prognosis:   Likely < 2 weeks based on renal failure  Discharge Planning: Anticipated hospital death versus transfer to residential hospice.  Care plan was discussed with  Patient, PCCM staff.   Thank you for allowing the Palliative Medicine Team to assist in the care of this patient.   Total Time 20 Prolonged Time Billed  no       Greater than 50%  of this time was spent  counseling and coordinating care related to the above assessment and plan.  Micheline Rough, MD  Please contact Palliative Medicine Team phone at [phone] for questions and concerns.

## 2018-09-23 MED ORDER — LORAZEPAM 0.5 MG PO TABS
0.5000 mg | ORAL_TABLET | ORAL | Status: DC | PRN
  Administered 2018-09-24 – 2018-09-26 (×4): 1 mg via ORAL
  Filled 2018-09-23 (×4): qty 2

## 2018-09-23 MED ORDER — HYDROMORPHONE HCL 1 MG/ML IJ SOLN
0.5000 mg | INTRAMUSCULAR | Status: DC | PRN
  Administered 2018-09-23 – 2018-09-26 (×9): 0.5 mg via INTRAMUSCULAR
  Filled 2018-09-23 (×9): qty 1

## 2018-09-23 NOTE — Progress Notes (Signed)
PT PULLED OUT PICC LINE. TRH T.OPYD NOTIFIED.

## 2018-09-23 NOTE — Progress Notes (Signed)
PROGRESS NOTE    Gregory Chaney  PNT:[id number] DOB: Oct 02, 1940 DOA: 09/16/2018 PCP: [name], NP    Brief Narrative:  [phone] presented with shock with metastatic prostate cancer and new findings concerning for metastatic colon cancer. 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 Septic shock 1. Suspected septic shock from unclear source 2. Required aggressive IVF hydration and pressor support 3. See below, now DNR and possible transition to comfort in the near future if no significant improvement 4. Pt had been continued on empiric abx 2. Lactic acidosis 1. Likely secondary to septic shock 3. Acute metabolic encephalopathy 1. Appears more awake but family reports change in affect over baseline 2. Suspect component of worsening uremia 3. Per below, DNR with likely focus on more comfort measures in the near future 4. Metastatic prostate and colon cancer 1. Likely not candidate for chemo or aggressive tx 2. Continue supportive care  5. Anemia of chronic disease 1. No evidence of acute bleed 2. Cont with supportive care 6. ARF 1. Worsening renal function noted despite maximal therapy 2. Prognosis regarding end stage renal failure alone would be less than 2 weeks 3. Lost IV overnight thus no further IVF. Minimal urine output. Given concerns of developing volume overload/sob, will hold further IVF 7. End of life 1. See previous note. Daughter from [facility] arrived and updated. Wishes noted for DNR. Daughter states pt's wishes would be for focus to "maintain his dignity" and for  comfort. 2. Appreciate input by Palliative Care. Plan to continue current course of abx and gentle hydration. If no significant improvement, then possibility for transition to full comfort/residential hospice. Hospital death is possible  DVT prophylaxis: Heparin discontinued Code Status: DNR Family Communication: Pt in room, family at bedside Disposition Plan: Uncertain at this time  Consultants:   PCCM  Palliative Care  Procedures:     Antimicrobials: Anti-infectives (From admission, onward)   Start     Dose/Rate Route Frequency Ordered Stop   09/19/18 1145  amoxicillin-clavulanate (AUGMENTIN) 500-125 MG per tablet 500 mg     500 mg Oral 2 times daily 09/19/18 1139     09/19/18 1130  amoxicillin-clavulanate (AUGMENTIN) 875-125 MG per tablet 1 tablet  Status:  Discontinued     1 tablet Oral Every 12 hours 09/19/18 1130 09/19/18 1139   09/19/18 1000  piperacillin-tazobactam (ZOSYN) IVPB 2.25 g  Status:  Discontinued     2.25 g 100 mL/hr over 30 Minutes Intravenous Every 8 hours 09/19/18 0943 09/19/18 1130   09/18/18 1400  piperacillin-tazobactam (ZOSYN) IVPB 2.25 g  Status:  Discontinued     2.25 g 100 mL/hr over 30 Minutes Intravenous Every 6 hours 09/18/18 1022 09/19/18 0943   09/18/18 1022  vancomycin variable dose per unstable renal function (pharmacist dosing)  Status:  Discontinued      Does not apply See admin instructions 09/18/18 1022 09/19/18 0849   09/17/18 2000  vancomycin (VANCOCIN) 1,500 mg in sodium chloride 0.9 % 500 mL IVPB     1,500 mg 250 mL/hr over 120 Minutes Intravenous  Once 09/17/18  1859 09/17/18 2214   09/16/18 2000  piperacillin-tazobactam (ZOSYN) IVPB 3.375 g  Status:  Discontinued     3.375 g 12.5 mL/hr over 240 Minutes Intravenous Every 8 hours 09/16/18 1655 09/18/18 1022   09/16/18 1100  vancomycin (VANCOCIN) IVPB 1000 mg/200 mL premix     1,000 mg 200 mL/hr over 60 Minutes Intravenous  Once 09/16/18 1049 09/16/18 1320   09/16/18 1100   piperacillin-tazobactam (ZOSYN) IVPB 3.375 g     3.375 g 12.5 mL/hr over 240 Minutes Intravenous  Once 09/16/18 1049 09/16/18 1535      Subjective: Without complaints. Without sob  Objective: Vitals:   09/22/18 1440 09/22/18 2120 09/23/18 0610 09/23/18 1321  BP: (!) 92/57 (!) 102/56 100/62 96/63  Pulse: 91 94 86 97  Resp: 16 16 16 18   Temp: 98.2 F (36.8 C) 97.7 F (36.5 C) 97.9 F (36.6 C) 98 F (36.7 C)  TempSrc: Oral Oral Oral Oral  SpO2: 92% 93% 94% 93%  Weight:      Height:        Intake/Output Summary (Last 24 hours) at 09/23/2018 1811 Last data filed at 09/23/2018 1330 Gross per 24 hour  Intake 0 ml  Output -  Net 0 ml   Filed Weights   09/19/18 0500 09/20/18 1427 09/22/18 0609  Weight: 100.1 kg 99.5 kg 98.7 kg    Examination: General exam: Conversant, in no acute distress Respiratory system: normal chest rise, clear, no audible wheezing Cardiovascular system: regular rhythm, s1-s2  Data Reviewed: I have personally reviewed following labs and imaging studies  CBC: Recent Labs  Lab 09/17/18 0518 09/18/18 0704 09/19/18 0315  WBC 12.7* 12.8* 13.1*  NEUTROABS  --  10.7* 10.5*  HGB 7.7* 8.6* 8.6*  HCT 21.5* 25.8*  24.1* 25.7*  MCV 63.4* 66.5* 64.9*  PLT 152 192 625   Basic Metabolic Panel: Recent Labs  Lab 09/17/18 0518 09/18/18 0704 09/19/18 0315 09/20/18 0323  NA 137 137 138 138  K 4.3 4.0 4.1 4.4  CL 103 103 104 105  CO2 21* 17* 16* 15*  GLUCOSE 89 86 128* 82  BUN 43* 49* 55* 58*  CREATININE 2.95* 3.56* 4.03* 4.67*  CALCIUM 7.8* 7.6* 7.7* 7.6*  MG  --  2.3 2.1  --   PHOS  --  5.9*  --   --    GFR: Estimated Creatinine Clearance: 15.6 mL/min (A) (by C-G formula based on SCr of 4.67 mg/dL (H)). Liver Function Tests: Recent Labs  Lab 09/17/18 0518 09/18/18 0704 09/19/18 0315  AST 174* 159* 111*  ALT 56* 67* 64*  ALKPHOS 260* 272* 255*  BILITOT 8.7* 9.3* 9.1*  PROT 5.4* 6.2* 6.0*  ALBUMIN 1.5* 1.6* 1.6*   No results for  input(s): LIPASE, AMYLASE in the last 168 hours. Recent Labs  Lab 09/18/18 0704  AMMONIA 26   Coagulation Profile: No results for input(s): INR, PROTIME in the last 168 hours. Cardiac Enzymes: Recent Labs  Lab 09/16/18 2326 09/17/18 0518 09/18/18 0704  CKTOTAL  --   --  942*  TROPONINI 0.03* 0.03*  --    BNP (last 3 results) No results for input(s): PROBNP in the last 8760 hours. HbA1C: No results for input(s): HGBA1C in the last 72 hours. CBG: No results for input(s): GLUCAP in the last 168 hours. Lipid Profile: No results for input(s): CHOL, HDL, LDLCALC, TRIG, CHOLHDL, LDLDIRECT in the last 72 hours. Thyroid Function Tests: No results for input(s): TSH, T4TOTAL, FREET4, T3FREE, THYROIDAB in the last  72 hours. Anemia Panel: No results for input(s): VITAMINB12, FOLATE, FERRITIN, TIBC, IRON, RETICCTPCT in the last 72 hours. Sepsis Labs: Recent Labs  Lab 09/17/18 1855 09/18/18 0704 09/19/18 0315  PROCALCITON 13.17 13.03 12.56    Recent Results (from the past 240 hour(s))  Blood Culture (routine x 2)     Status: None   Collection Time: 09/16/18 10:38 AM  Result Value Ref Range Status   Specimen Description   Final    BLOOD RIGHT HAND Performed at [facility] [address], [place], [place] [address]    Special Requests   Final    BOTTLES DRAWN AEROBIC AND ANAEROBIC Blood Culture results may not be optimal due to an inadequate volume of blood received in culture bottles Performed at [facility] [address] [place], [place] [address]    Culture   Final    NO GROWTH 5 DAYS Performed at [facility], [place] [address], [place], [place] [address]    Report Status 09/21/2018 FINAL  Final  Blood Culture (routine x 2)     Status: None   Collection Time: 09/16/18 10:38 AM  Result Value Ref Range Status   Specimen Description   Final    BLOOD ARM Performed at [facility] [address]  [place], [place] [address]    Special Requests   Final    BOTTLES DRAWN AEROBIC AND ANAEROBIC Blood Culture adequate volume Performed at [facility] [address] [place], [place] [address]    Culture   Final    NO GROWTH 5 DAYS Performed at [facility], [place] [address] [place],  [address]    Report Status 09/21/2018 FINAL  Final  Urine culture     Status: None   Collection Time: 09/16/18 10:38 AM  Result Value Ref Range Status   Specimen Description URINE, RANDOM  Final   Special Requests   Final    NONE Performed at [facility] [address] [place],  [address]    Culture NO GROWTH  Final   Report Status 09/18/2018 FINAL  Final  MRSA PCR Screening     Status: None   Collection Time: 09/16/18  3:54 PM  Result Value Ref Range Status   MRSA by PCR NEGATIVE NEGATIVE Final    Comment:        The GeneXpert MRSA Assay (FDA approved for NASAL specimens only), is one component of a comprehensive MRSA colonization surveillance program. It is not intended to diagnose MRSA infection nor to guide or monitor treatment for MRSA infections. Performed at [facility], [place] [address] [place],  [address]      Radiology Studies: No results found.  Scheduled Meds: . amoxicillin-clavulanate  500 mg Oral BID  . mouth rinse  15 mL Mouth Rinse BID  . pantoprazole  40 mg Oral Daily  . sodium chloride flush  10-40 mL Intracatheter Q12H  . sodium chloride flush  3 mL Intravenous Q12H   Continuous Infusions: . sodium chloride 250 mL (09/22/18 1008)  . lactated ringers 50 mL/hr at 09/22/18 0612     LOS: 7 days   Marylu Lund, MD [facility] Pager On Amion  If 7PM-7AM, please contact night-coverage 09/23/2018, 6:11 PM

## 2018-09-23 NOTE — Progress Notes (Signed)
Daily Progress Note   Patient Name: Gregory Chaney       Date: 09/23/2018 DOB: Jan 15, 1941  Age: 78 y.o. MRN#: [id number] Attending Physician: Donne Hazel, MD Primary Care Physician: [name], NP Admit Date: 09/16/2018  Reason for Consultation/Follow-up: Establishing goals of care  Subjective: Patient appears sleepier today.  No family present today.   Length of Stay: 7  Current Medications: Scheduled Meds:  . amoxicillin-clavulanate  500 mg Oral BID  . mouth rinse  15 mL Mouth Rinse BID  . pantoprazole  40 mg Oral Daily  . sodium chloride flush  10-40 mL Intracatheter Q12H  . sodium chloride flush  3 mL Intravenous Q12H    Continuous Infusions: . sodium chloride 250 mL (09/22/18 1008)  . lactated ringers 50 mL/hr at 09/22/18 0612    PRN Meds: glycopyrrolate, haloperidol lactate, HYDROmorphone (DILAUDID) injection, LORazepam, ondansetron **OR** ondansetron (ZOFRAN) IV, sodium chloride flush  Physical Exam         Weak appearing gentleman. Less interactive today.  Lying in bed. Diminished S 1 S 2  His abdomen is distended + edema  Vital Signs: BP (!) 88/60 (BP Location: Left Arm)   Pulse 83   Temp (!) 97.5 F (36.4 C) (Oral)   Resp 16   Ht 5\' 9"  (1.753 m)   Wt 98.7 kg   SpO2 96%   BMI 32.13 kg/m  SpO2: SpO2: 96 % O2 Device: O2 Device: Room Air O2 Flow Rate:    Intake/output summary:   Intake/Output Summary (Last 24 hours) at 09/23/2018 2330 Last data filed at 09/23/2018 1330 Gross per 24 hour  Intake 0 ml  Output -  Net 0 ml   LBM: Last BM Date: 09/21/18 Baseline Weight: Weight: 89.2 kg Most recent weight: Weight: 98.7 kg      PPS 40% Palliative Assessment/Data:    Flowsheet Rows     Most Recent Value  Intake Tab  Referral Department   Hospitalist  Unit at Time of Referral  ICU  Palliative Care Primary Diagnosis  Cancer  Date Notified  09/16/18  Palliative Care Type  New Palliative care  Reason for referral  Clarify Goals of Care  Date of Admission  09/16/18  Date first seen by Palliative Care  09/17/18  # of days Palliative referral response time  1 Day(s)  #  of days IP prior to Palliative referral  0  Clinical Assessment  Psychosocial & Spiritual Assessment  Palliative Care Outcomes      Patient Active Problem List   Diagnosis Date Noted  . AKI (acute kidney injury) ([place])   . Palliative care by specialist   . Goals of care, counseling/discussion   . Lactic acidosis 09/16/2018  . Dysphagia 09/16/2018  . Colonic mass 09/16/2018  . Hepatic metastases ([place]) 09/16/2018  . Elevated troponin 09/16/2018  . Acute renal failure ([place]) 09/16/2018  . Hyperbilirubinemia 09/16/2018  . 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 Bone metastases ([place]) 10/26/2016  . Prostate CA ([place]) 04/23/2014  . Obesity 04/23/2014  . Prostate cancer metastatic to bone ([place]) 04/23/2014  . Hematuria 04/23/2014    Palliative Care Assessment & Plan   Patient Profile:    Assessment:  - 78 y.o., male admitted on 09/16/2018 for hypotension shock, unknown source however known metastatic prostate cancer as well as new CT imaging concerning for metastatic colon cancer, large sigmoid lesion as well as multiple hepatic metastasis. - Patient remains admitted with shock and multi organ failure. High risk for ongoing decline, decompensation and death. High possibility of the patient not surviving this hospitalization. This was discussed with patient's HCPOA daughter [name] over the phone by Drs. Anwar and Icard.  She is working to get to [place].  Her flight has been delayed/rerouted due to weather.  Per her hother in Sports coach, she is currently stuck in [place] with plan to [place] via  [place].  Hopeful top arrive late this evening.  Recommendations/Plan: -DNR/DNI.    -Continue current interventions (oral antibiotics).  [name]    -We began discussion regarding residential hospice yesterday.  Continue to assess daily.   -PMT will follow up tomorrow.     Code Status:     DNR  Prognosis:   Likely < 2 weeks based on renal failure  Discharge Planning: Anticipated hospital death versus transfer to residential hospice.  Care plan was discussed with  Patient, PCCM staff.   Thank you for allowing the Palliative Medicine Team to assist in the care of this patient.   Total Time 20 Prolonged Time Billed  no       Greater than 50%  of this time was spent counseling and coordinating care related to the above assessment and plan.  Micheline Rough, MD  Please contact Palliative Medicine Team phone at [phone] for questions and concerns.

## 2018-09-24 MED ORDER — LIP MEDEX EX OINT
TOPICAL_OINTMENT | CUTANEOUS | Status: DC | PRN
  Administered 2018-09-24 (×2): via TOPICAL
  Filled 2018-09-24: qty 7

## 2018-09-24 NOTE — Progress Notes (Signed)
PROGRESS NOTE    Gregory Chaney  WER:[id number] DOB: 23-Aug-1941 DOA: 09/16/2018 PCP: [name], NP    Brief Narrative:  [phone] presented with shock with metastatic prostate cancer and new findings concerning for metastatic colon cancer. 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 Septic shock 1. Suspected septic shock from unclear source 2. Required aggressive IVF hydration and pressor support 3. See below, now DNR and possible transition to comfort in the near future if no significant improvement 4. Pt had been continued on empiric abx thus far 2. Lactic acidosis 1. Likely secondary to septic shock 3. Acute metabolic encephalopathy 1. Appears more awake but family reports change in affect over baseline 2. Suspect component of worsening uremia 3. Per below, DNR with likely focus on more comfort measures in the near future 4. Metastatic prostate and colon cancer 1. Likely not candidate for chemo or aggressive tx 2. Continue supportive care  5. Anemia of chronic disease 1. No evidence of acute bleed 2. Cont with supportive care 6. ARF 1. Worsening renal function noted despite maximal therapy 2. Prognosis regarding end stage renal failure alone would be less than 2 weeks 3. Minimal urine output noted 7. End of life 1. See previous note. Daughter from [facility] arrived and updated. Wishes noted for DNR. Daughter states pt's wishes would be for focus to "maintain his dignity" and for comfort. 2. Appreciate input by Palliative Care. Plan to continue current course of abx and gentle  hydration. If no significant improvement, then possibility for transition to full comfort/residential hospice. Hospital death is possible  DVT prophylaxis: Heparin discontinued Code Status: DNR Family Communication: Pt in room, family at bedside Disposition Plan: Uncertain at this time  Consultants:   PCCM  Palliative Care  Procedures:     Antimicrobials: Anti-infectives (From admission, onward)   Start     Dose/Rate Route Frequency Ordered Stop   09/19/18 1145  amoxicillin-clavulanate (AUGMENTIN) 500-125 MG per tablet 500 mg     500 mg Oral 2 times daily 09/19/18 1139     09/19/18 1130  amoxicillin-clavulanate (AUGMENTIN) 875-125 MG per tablet 1 tablet  Status:  Discontinued     1 tablet Oral Every 12 hours 09/19/18 1130 09/19/18 1139   09/19/18 1000  piperacillin-tazobactam (ZOSYN) IVPB 2.25 g  Status:  Discontinued     2.25 g 100 mL/hr over 30 Minutes Intravenous Every 8 hours 09/19/18 0943 09/19/18 1130   09/18/18 1400  piperacillin-tazobactam (ZOSYN) IVPB 2.25 g  Status:  Discontinued     2.25 g 100 mL/hr over 30 Minutes Intravenous Every 6 hours 09/18/18 1022 09/19/18 0943   09/18/18 1022  vancomycin variable dose per unstable renal function (pharmacist dosing)  Status:  Discontinued      Does not apply See admin instructions 09/18/18 1022 09/19/18 0849   09/17/18 2000  vancomycin (VANCOCIN) 1,500 mg in sodium chloride 0.9 % 500 mL IVPB     1,500 mg 250 mL/hr over 120 Minutes Intravenous  Once 09/17/18 1859 09/17/18 2214   09/16/18 2000  piperacillin-tazobactam (ZOSYN) IVPB 3.375 g  Status:  Discontinued     3.375 g 12.5 mL/hr over 240 Minutes Intravenous Every 8 hours 09/16/18 1655 09/18/18 1022   09/16/18 1100  vancomycin (VANCOCIN) IVPB 1000 mg/200 mL premix     1,000 mg 200 mL/hr over 60 Minutes Intravenous  Once 09/16/18 1049 09/16/18 1320   09/16/18 1100  piperacillin-tazobactam (ZOSYN) IVPB 3.375 g     3.375 g 12.5 mL/hr over 240 Minutes Intravenous  Once  09/16/18 1049 09/16/18 1535      Subjective: Asleep this AM  Objective: Vitals:   09/23/18 2013 09/24/18 0034 09/24/18 0702 09/24/18 1328  BP: (!) 88/60 101/60 (!) 80/45 (!) 91/53  Pulse: 83 83 83 94  Resp: 16 (!) 28 18 16   Temp: (!) 97.5 F (36.4 C)  (!) 96.4 F (35.8 C)   TempSrc: Oral  Axillary   SpO2: 96% 98% 92% 100%  Weight:      Height:        Intake/Output Summary (Last 24 hours) at 09/24/2018 1551 Last data filed at 09/24/2018 1150 Gross per 24 hour  Intake 0 ml  Output -  Net 0 ml   Filed Weights   09/19/18 0500 09/20/18 1427 09/22/18 0609  Weight: 100.1 kg 99.5 kg 98.7 kg    Examination: General exam: Asleep, laying in bed, in nad Respiratory system: Normal respiratory effort, no wheezing  Data Reviewed: I have personally reviewed following labs and imaging studies  CBC: Recent Labs  Lab 09/18/18 0704 09/19/18 0315  WBC 12.8* 13.1*  NEUTROABS 10.7* 10.5*  HGB 8.6* 8.6*  HCT 25.8*  24.1* 25.7*  MCV 66.5* 64.9*  PLT 192 790   Basic Metabolic Panel: Recent Labs  Lab 09/18/18 0704 09/19/18 0315 09/20/18 0323  NA 137 138 138  K 4.0 4.1 4.4  CL 103 104 105  CO2 17* 16* 15*  GLUCOSE 86 128* 82  BUN 49* 55* 58*  CREATININE 3.56* 4.03* 4.67*  CALCIUM 7.6* 7.7* 7.6*  MG 2.3 2.1  --   PHOS 5.9*  --   --    GFR: Estimated Creatinine Clearance: 15.6 mL/min (A) (by C-G formula based on SCr of 4.67 mg/dL (H)). Liver Function Tests: Recent Labs  Lab 09/18/18 0704 09/19/18 0315  AST 159* 111*  ALT 67* 64*  ALKPHOS 272* 255*  BILITOT 9.3* 9.1*  PROT 6.2* 6.0*  ALBUMIN 1.6* 1.6*   No results for input(s): LIPASE, AMYLASE in the last 168 hours. Recent Labs  Lab 09/18/18 0704  AMMONIA 26   Coagulation Profile: No results for input(s): INR, PROTIME in the last 168 hours. Cardiac Enzymes: Recent Labs  Lab 09/18/18 0704  CKTOTAL 942*   BNP (last 3 results) No results for input(s): PROBNP in the last 8760 hours. HbA1C: No results for  input(s): HGBA1C in the last 72 hours. CBG: No results for input(s): GLUCAP in the last 168 hours. Lipid Profile: No results for input(s): CHOL, HDL, LDLCALC, TRIG, CHOLHDL, LDLDIRECT in the last 72 hours. Thyroid Function Tests: No results for input(s): TSH, T4TOTAL, FREET4, T3FREE, THYROIDAB in the last 72 hours. Anemia Panel: No results for input(s): VITAMINB12, FOLATE, FERRITIN, TIBC, IRON, RETICCTPCT in the last 72 hours. Sepsis Labs: Recent Labs  Lab 09/17/18 1855 09/18/18 0704 09/19/18 0315  PROCALCITON 13.17 13.03 12.56    Recent Results (from the past 240 hour(s))  Blood Culture (routine x 2)     Status: None   Collection Time: 09/16/18 10:38 AM  Result Value Ref Range Status   Specimen  Description   Final    BLOOD RIGHT HAND Performed at [facility], [place] [address], [place], [place] [address]    Special Requests   Final    BOTTLES DRAWN AEROBIC AND ANAEROBIC Blood Culture results may not be optimal due to an inadequate volume of blood received in culture bottles Performed at [facility] [address], [place], [place] [address]    Culture   Final    NO GROWTH 5 DAYS Performed at [facility], [place] [address] [place], [place] [address]    Report Status 09/21/2018 FINAL  Final  Blood Culture (routine x 2)     Status: None   Collection Time: 09/16/18 10:38 AM  Result Value Ref Range Status   Specimen Description   Final    BLOOD ARM Performed at [facility] [address] [place], [place] [address]    Special Requests   Final    BOTTLES DRAWN AEROBIC AND ANAEROBIC Blood Culture adequate volume Performed at [facility] [address] [place], [place] [address]    Culture   Final    NO GROWTH 5 DAYS Performed at [facility], [place] [address], [place], [place] [address]    Report Status 09/21/2018 FINAL  Final  Urine culture     Status: None   Collection Time:  09/16/18 10:38 AM  Result Value Ref Range Status   Specimen Description URINE, RANDOM  Final   Special Requests   Final    NONE Performed at [facility] [address] [place], [place] [address]    Culture NO GROWTH  Final   Report Status 09/18/2018 FINAL  Final  MRSA PCR Screening     Status: None   Collection Time: 09/16/18  3:54 PM  Result Value Ref Range Status   MRSA by PCR NEGATIVE NEGATIVE Final    Comment:        The GeneXpert MRSA Assay (FDA approved for NASAL specimens only), is one component of a comprehensive MRSA colonization surveillance program. It is not intended to diagnose MRSA infection nor to guide or monitor treatment for MRSA infections. Performed at [facility], [place] [address], [place], [place] [address]      Radiology Studies: No results found.  Scheduled Meds: . amoxicillin-clavulanate  500 mg Oral BID  . mouth rinse  15 mL Mouth Rinse BID  . pantoprazole  40 mg Oral Daily  . sodium chloride flush  10-40 mL Intracatheter Q12H  . sodium chloride flush  3 mL Intravenous Q12H   Continuous Infusions: . sodium chloride 250 mL (09/22/18 1008)  . lactated ringers 50 mL/hr at 09/22/18 0612     LOS: 8 days   Marylu Lund, MD [facility] Pager On Amion  If 7PM-7AM, please contact night-coverage 09/24/2018, 3:51 PM

## 2018-09-25 ENCOUNTER — Inpatient Hospital Stay: Payer: Medicare PPO

## 2018-09-25 ENCOUNTER — Inpatient Hospital Stay: Payer: Medicare PPO | Attending: Oncology | Admitting: Oncology

## 2018-09-25 MED ORDER — HYDROMORPHONE HCL 2 MG/ML IJ SOLN
INTRAMUSCULAR | Status: AC
  Administered 2018-09-25: 2 mg
  Filled 2018-09-25: qty 1

## 2018-09-25 NOTE — Progress Notes (Signed)
Daily Progress Note   Patient Name: Gregory Chaney       Date: 09/25/2018 DOB: Nov 22, 1940  Age: 78 y.o. MRN#: [id number] Attending Physician: Donne Hazel, MD Primary Care Physician: [name], NP Admit Date: 09/16/2018  Reason for Consultation/Follow-up: Establishing goals of care  Subjective: Patient remains sleepy today.  Chart reviewed and medication needs increasing.    No family present today.   Length of Stay: 9  Current Medications: Scheduled Meds:  . amoxicillin-clavulanate  500 mg Oral BID  . mouth rinse  15 mL Mouth Rinse BID  . pantoprazole  40 mg Oral Daily  . sodium chloride flush  10-40 mL Intracatheter Q12H  . sodium chloride flush  3 mL Intravenous Q12H    Continuous Infusions: . sodium chloride 250 mL (09/22/18 1008)  . lactated ringers 50 mL/hr at 09/22/18 0612    PRN Meds: glycopyrrolate, haloperidol lactate, HYDROmorphone (DILAUDID) injection, lip balm, LORazepam, ondansetron **OR** ondansetron (ZOFRAN) IV, sodium chloride flush  Physical Exam         Weak appearing gentleman. Minimally interactive today.  Lying in bed. Diminished S 1 S 2  His abdomen is distended + edema  Vital Signs: BP (!) 80/48 (BP Location: Left Arm)   Pulse 78   Temp 97.9 F (36.6 C) (Axillary)   Resp 16   Ht 5\' 9"  (1.753 m)   Wt 98.7 kg   SpO2 92%   BMI 32.13 kg/m  SpO2: SpO2: 92 % O2 Device: O2 Device: Room Air O2 Flow Rate: O2 Flow Rate (L/min): 2 L/min  Intake/output summary:   Intake/Output Summary (Last 24 hours) at 09/25/2018 1434 Last data filed at 09/25/2018 0413 Gross per 24 hour  Intake 0 ml  Output 120 ml  Net -120 ml   LBM: Last BM Date: 09/21/18 Baseline Weight: Weight: 89.2 kg Most recent weight: Weight: 98.7 kg      PPS 20% Palliative  Assessment/Data:    Flowsheet Rows     Most Recent Value  Intake Tab  Referral Department  Hospitalist  Unit at Time of Referral  ICU  Palliative Care Primary Diagnosis  Cancer  Date Notified  09/16/18  Palliative Care Type  New Palliative care  Reason for referral  Clarify Goals of Care  Date of Admission  09/16/18  Date first seen by  Palliative Care  09/17/18  # of days Palliative referral response time  1 Day(s)  # of days IP prior to Palliative referral  0  Clinical Assessment  Psychosocial & Spiritual Assessment  Palliative Care Outcomes      Patient Active Problem List   Diagnosis Date Noted  . AKI (acute kidney injury) ([place])   . Palliative care by specialist   . Goals of care, counseling/discussion   . Lactic acidosis 09/16/2018  . Dysphagia 09/16/2018  . Colonic mass 09/16/2018  . Hepatic metastases ([place]) 09/16/2018  . Elevated troponin 09/16/2018  . Acute renal failure ([place]) 09/16/2018  . Hyperbilirubinemia 09/16/2018  . 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 Bone metastases ([place]) 10/26/2016  . Prostate CA ([place]) 04/23/2014  . Obesity 04/23/2014  . Prostate cancer metastatic to bone ([place]) 04/23/2014  . Hematuria 04/23/2014    Palliative Care Assessment & Plan   Patient Profile:    Assessment:  - 78 y.o., male admitted on 09/16/2018 for hypotension shock, unknown source however known metastatic prostate cancer as well as new CT imaging concerning for metastatic colon cancer, large sigmoid lesion as well as multiple hepatic metastasis. - Patient remains admitted with shock and multi organ failure. High risk for ongoing decline, decompensation and death. High possibility of the patient not surviving this hospitalization. This was discussed with patient's HCPOA daughter [name] over the phone by Drs. Anwar and Icard.  She is working to get to [place].  Her flight has been delayed/rerouted due to  weather.  Per her hother in Sports coach, she is currently stuck in [place] with plan to [place] via [place].  Hopeful top arrive late this evening.  Recommendations/Plan: - DNR/DNI.   - His daughter is interested in residential hospice placement.  She has discussed with [facility] regarding placement at [facility] for end of life care when bed becomes available. -PMT is available to assist as needed, but will not round on patient daily unless there are specific needs.  Please call with any concerns.     Code Status:     DNR  Prognosis:  < 2 weeks based on renal failure.  Discharge Planning: Anticipated hospital death versus transfer to residential hospice.  His pain management needs are increasing and I believe he would be well served by residential hospice if it can be arranged.  Care plan was discussed with  Patient, PCCM staff.   Thank you for allowing the Palliative Medicine Team to assist in the care of this patient.   Total Time 20 Prolonged Time Billed  no       Greater than 50%  of this time was spent counseling and coordinating care related to the above assessment and plan.  Micheline Rough, MD  Please contact Palliative Medicine Team phone at [phone] for questions and concerns.

## 2018-09-25 NOTE — Care Management Important Message (Signed)
Important Message  Patient Details  Name: Gregory Chaney MRN: [id number] Date of Birth: 19-May-1941   Medicare Important Message Given:  Yes    Kerin Salen 09/25/2018, 10:55 [place] Message  Patient Details  Name: Gregory Chaney MRN: [id number] Date of Birth: 1941-01-25   Medicare Important Message Given:  Yes    Kerin Salen 09/25/2018, 10:55 AM

## 2018-09-25 NOTE — Progress Notes (Signed)
Clinical Social Worker received consult to assist patient in being place into a residential hospice facility at discharge. CSW received call from patients RN who stated patient's POA (Daughter) would prefer [facility]. CSW spoke with Venia Carbon to make referral. Anderson Malta stated she will reach back out to CSW if facility is able to make bed offer.   Rhea Pink, MSW,  [place]

## 2018-09-25 NOTE — Progress Notes (Signed)
PROGRESS NOTE    Gregory Chaney  ZOX:[id number] DOB: December 17, 1940 DOA: 09/16/2018 PCP: [name], NP    Brief Narrative:  [phone] presented with shock with metastatic prostate cancer and new findings concerning for metastatic colon cancer. 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 Septic shock 1. Suspected septic shock from unclear source 2. Required aggressive IVF hydration and pressor support 3. See below, now DNR and possible transition to comfort in the near future if no significant improvement 4. Continued decline despite empiric abx 2. Lactic acidosis 1. Likely secondary to septic shock 3. Acute metabolic encephalopathy 1. Appears more awake but family reports change in affect over baseline 2. Suspect component of worsening uremia 3. Per below, DNR with likely focus on more comfort measures in the near future 4. Metastatic prostate and colon cancer 1. Likely not candidate for chemo or aggressive tx 2. Continue supportive care  5. Anemia of chronic disease 1. No evidence of acute bleed 2. Cont with supportive care 6. ARF 1. Worsening renal function noted despite maximal therapy 2. Prognosis regarding end stage renal failure alone would be less than 2 weeks 3. Minimal urine output noted 7. End of life 1. See previous note. Daughter from [facility] arrived and updated. Wishes noted for DNR. Daughter states pt's wishes would be for focus to "maintain his dignity" and for comfort. 2. Appreciate input by Palliative Care. Plan to continue current course of abx and gentle hydration. If  no significant improvement, then possibility for transition to full comfort/residential hospice. Family interested in residential hospice, SW consulted. Also possibility for hospital death  DVT prophylaxis: Heparin discontinued Code Status: DNR Family Communication: Pt in room, family at bedside Disposition Plan: Uncertain at this time  Consultants:   PCCM  Palliative Care  Procedures:     Antimicrobials: Anti-infectives (From admission, onward)   Start     Dose/Rate Route Frequency Ordered Stop   09/19/18 1145  amoxicillin-clavulanate (AUGMENTIN) 500-125 MG per tablet 500 mg     500 mg Oral 2 times daily 09/19/18 1139     09/19/18 1130  amoxicillin-clavulanate (AUGMENTIN) 875-125 MG per tablet 1 tablet  Status:  Discontinued     1 tablet Oral Every 12 hours 09/19/18 1130 09/19/18 1139   09/19/18 1000  piperacillin-tazobactam (ZOSYN) IVPB 2.25 g  Status:  Discontinued     2.25 g 100 mL/hr over 30 Minutes Intravenous Every 8 hours 09/19/18 0943 09/19/18 1130   09/18/18 1400  piperacillin-tazobactam (ZOSYN) IVPB 2.25 g  Status:  Discontinued     2.25 g 100 mL/hr over 30 Minutes Intravenous Every 6 hours 09/18/18 1022 09/19/18 0943   09/18/18 1022  vancomycin variable dose per unstable renal function (pharmacist dosing)  Status:  Discontinued      Does not apply See admin instructions 09/18/18 1022 09/19/18 0849   09/17/18 2000  vancomycin (VANCOCIN) 1,500 mg in sodium chloride 0.9 % 500 mL IVPB     1,500 mg 250 mL/hr over 120 Minutes Intravenous  Once 09/17/18 1859 09/17/18 2214   09/16/18 2000  piperacillin-tazobactam (ZOSYN)  IVPB 3.375 g  Status:  Discontinued     3.375 g 12.5 mL/hr over 240 Minutes Intravenous Every 8 hours 09/16/18 1655 09/18/18 1022   09/16/18 1100  vancomycin (VANCOCIN) IVPB 1000 mg/200 mL premix     1,000 mg 200 mL/hr over 60 Minutes Intravenous  Once 09/16/18 1049 09/16/18 1320   09/16/18 1100  piperacillin-tazobactam (ZOSYN) IVPB 3.375 g     3.375  g 12.5 mL/hr over 240 Minutes Intravenous  Once 09/16/18 1049 09/16/18 1535      Subjective: Currently asleep  Objective: Vitals:   09/24/18 0702 09/24/18 1328 09/24/18 2023 09/25/18 0413  BP: (!) 80/45 (!) 91/53 (!) 80/50 (!) 80/48  Pulse: 83 94 72 78  Resp: 18 16 14 16   Temp: (!) 96.4 F (35.8 C)  97.7 F (36.5 C) 97.9 F (36.6 C)  TempSrc: Axillary  Axillary Axillary  SpO2: 92% 100% 100% 92%  Weight:      Height:        Intake/Output Summary (Last 24 hours) at 09/25/2018 1551 Last data filed at 09/25/2018 0413 Gross per 24 hour  Intake 0 ml  Output 120 ml  Net -120 ml   Filed Weights   09/19/18 0500 09/20/18 1427 09/22/18 0609  Weight: 100.1 kg 99.5 kg 98.7 kg    Examination: General exam: Not conversant given level of mentation, in no acute distress Respiratory system: normal chest rise, clear, no audible wheezing  Data Reviewed: I have personally reviewed following labs and imaging studies  CBC: Recent Labs  Lab 09/19/18 0315  WBC 13.1*  NEUTROABS 10.5*  HGB 8.6*  HCT 25.7*  MCV 64.9*  PLT 675   Basic Metabolic Panel: Recent Labs  Lab 09/19/18 0315 09/20/18 0323  NA 138 138  K 4.1 4.4  CL 104 105  CO2 16* 15*  GLUCOSE 128* 82  BUN 55* 58*  CREATININE 4.03* 4.67*  CALCIUM 7.7* 7.6*  MG 2.1  --    GFR: Estimated Creatinine Clearance: 15.6 mL/min (A) (by C-G formula based on SCr of 4.67 mg/dL (H)). Liver Function Tests: Recent Labs  Lab 09/19/18 0315  AST 111*  ALT 64*  ALKPHOS 255*  BILITOT 9.1*  PROT 6.0*  ALBUMIN 1.6*   No results for input(s): LIPASE, AMYLASE in the last 168 hours. No results for input(s): AMMONIA in the last 168 hours. Coagulation Profile: No results for input(s): INR, PROTIME in the last 168 hours. Cardiac Enzymes: No results for input(s): CKTOTAL, CKMB, CKMBINDEX, TROPONINI in the last 168 hours. BNP (last 3 results) No results for input(s): PROBNP in the last 8760 hours. HbA1C: No results for input(s):  HGBA1C in the last 72 hours. CBG: No results for input(s): GLUCAP in the last 168 hours. Lipid Profile: No results for input(s): CHOL, HDL, LDLCALC, TRIG, CHOLHDL, LDLDIRECT in the last 72 hours. Thyroid Function Tests: No results for input(s): TSH, T4TOTAL, FREET4, T3FREE, THYROIDAB in the last 72 hours. Anemia Panel: No results for input(s): VITAMINB12, FOLATE, FERRITIN, TIBC, IRON, RETICCTPCT in the last 72 hours. Sepsis Labs: Recent Labs  Lab 09/19/18 0315  PROCALCITON 12.56    Recent Results (from the past 240 hour(s))  Blood Culture (routine x 2)     Status: None   Collection Time: 09/16/18 10:38 AM  Result Value Ref Range Status   Specimen Description   Final    BLOOD RIGHT HAND Performed at [facility] [address] [place],  [address]    Special Requests   Final  BOTTLES DRAWN AEROBIC AND ANAEROBIC Blood Culture results may not be optimal due to an inadequate volume of blood received in culture bottles Performed at [facility], [place] [address] [place], [place] [address]    Culture   Final    NO GROWTH 5 DAYS Performed at [facility], [place] [address], [place], [place] [address]    Report Status 09/21/2018 FINAL  Final  Blood Culture (routine x 2)     Status: None   Collection Time: 09/16/18 10:38 AM  Result Value Ref Range Status   Specimen Description   Final    BLOOD ARM Performed at [facility] [address], [place], [place] [address]    Special Requests   Final    BOTTLES DRAWN AEROBIC AND ANAEROBIC Blood Culture adequate volume Performed at [facility] [address], [place], [place] [address]    Culture   Final    NO GROWTH 5 DAYS Performed at [facility], [place] [address], [place], [place] [address]    Report Status 09/21/2018 FINAL  Final  Urine culture     Status: None   Collection Time: 09/16/18 10:38 AM  Result Value Ref Range Status    Specimen Description URINE, RANDOM  Final   Special Requests   Final    NONE Performed at [facility] [address] [place], [place] [address]    Culture NO GROWTH  Final   Report Status 09/18/2018 FINAL  Final  MRSA PCR Screening     Status: None   Collection Time: 09/16/18  3:54 PM  Result Value Ref Range Status   MRSA by PCR NEGATIVE NEGATIVE Final    Comment:        The GeneXpert MRSA Assay (FDA approved for NASAL specimens only), is one component of a comprehensive MRSA colonization surveillance program. It is not intended to diagnose MRSA infection nor to guide or monitor treatment for MRSA infections. Performed at [facility], [place] [address], [place],  [address]      Radiology Studies: No results found.  Scheduled Meds: . amoxicillin-clavulanate  500 mg Oral BID  . mouth rinse  15 mL Mouth Rinse BID  . pantoprazole  40 mg Oral Daily  . sodium chloride flush  10-40 mL Intracatheter Q12H  . sodium chloride flush  3 mL Intravenous Q12H   Continuous Infusions: . sodium chloride 250 mL (09/22/18 1008)  . lactated ringers 50 mL/hr at 09/22/18 0612     LOS: 9 days   Marylu Lund, MD [facility] Pager On Amion  If 7PM-7AM, please contact night-coverage 09/25/2018, 3:51 PM

## 2018-09-25 NOTE — Progress Notes (Addendum)
[facility])   Received request from [facility] for family interest in [facility].   Chart reviewed and spoke [place] (dtr) to acknowledge referral.  Unfortunately [facility] is not able to offer a room today.  Family and CSW are aware [facility] liaison will follow up with CSW and family tomorrow or sooner if room becomes available.  Please do not hesitate to call with questions.    Thank you, Venia Carbon BSN, [facility] Liaison (listed in [place]) [phone]

## 2018-09-26 MED ORDER — LORAZEPAM 2 MG/ML PO CONC: 1.0000 mg | ORAL | Status: DC | PRN

## 2018-09-26 MED ORDER — MORPHINE SULFATE (CONCENTRATE) 10 MG/0.5ML PO SOLN
10.0000 mg | ORAL | Status: DC | PRN
  Administered 2018-09-26 – 2018-09-27 (×4): 10 mg via SUBLINGUAL
  Filled 2018-09-26 (×4): qty 0.5

## 2018-09-26 NOTE — Progress Notes (Signed)
PROGRESS NOTE    Gregory Chaney  XHB:[id number] DOB: 05-05-1941 DOA: 09/16/2018 PCP: [name], NP    Brief Narrative:  [phone] presented with shock with metastatic prostate cancer and new findings concerning for metastatic colon cancer. 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 Septic shock 1. Suspected septic shock from unclear source 2. Required aggressive IVF hydration and pressor support 3. See below, now DNR and possible transition to comfort in the near future if no significant improvement 4. Completed one week course of abx 2. Lactic acidosis 1. Likely secondary to septic shock 3. Acute metabolic encephalopathy 1. Appears more awake but family reports change in affect over baseline 2. Suspect component of worsening uremia 3. Per below, DNR with likely focus on more comfort measures in the near future 4. Metastatic prostate and colon cancer 1. Likely not candidate for chemo or aggressive tx 2. Continue supportive care  5. Anemia of chronic disease 1. No evidence of acute bleed 2. Cont with supportive care 6. ARF 1. Worsening renal function noted despite maximal therapy 2. Prognosis regarding end stage renal failure alone would be less than 2 weeks 3. Minimal urine output noted 7. End of life 1. See previous note. Daughter from [facility] arrived and updated. Wishes noted for DNR. Daughter states pt's wishes would be for focus to "maintain his dignity" and for comfort. 2. Appreciate input by Palliative Care. Plan to continue current course of abx and gentle hydration. If no  significant improvement, then possibility for transition to full comfort/residential hospice. Family interested in residential hospice, SW consulted. Also possibility for hospital death. Pending possible placement to residential hospice  DVT prophylaxis: Heparin discontinued Code Status: DNR Family Communication: Pt in room, family at bedside Disposition Plan: Uncertain at this time  Consultants:   PCCM  Palliative Care  Procedures:     Antimicrobials: Anti-infectives (From admission, onward)   Start     Dose/Rate Route Frequency Ordered Stop   09/19/18 1145  amoxicillin-clavulanate (AUGMENTIN) 500-125 MG per tablet 500 mg  Status:  Discontinued     500 mg Oral 2 times daily 09/19/18 1139 09/26/18 1214   09/19/18 1130  amoxicillin-clavulanate (AUGMENTIN) 875-125 MG per tablet 1 tablet  Status:  Discontinued     1 tablet Oral Every 12 hours 09/19/18 1130 09/19/18 1139   09/19/18 1000  piperacillin-tazobactam (ZOSYN) IVPB 2.25 g  Status:  Discontinued     2.25 g 100 mL/hr over 30 Minutes Intravenous Every 8 hours 09/19/18 0943 09/19/18 1130   09/18/18 1400  piperacillin-tazobactam (ZOSYN) IVPB 2.25 g  Status:  Discontinued     2.25 g 100 mL/hr over 30 Minutes Intravenous Every 6 hours 09/18/18 1022 09/19/18 0943   09/18/18 1022  vancomycin variable dose per unstable renal function (pharmacist dosing)  Status:  Discontinued      Does not apply See admin instructions 09/18/18 1022 09/19/18 0849   09/17/18 2000  vancomycin (VANCOCIN) 1,500 mg in sodium chloride 0.9 % 500 mL IVPB     1,500 mg 250 mL/hr over 120 Minutes Intravenous  Once  09/17/18 1859 09/17/18 2214   09/16/18 2000  piperacillin-tazobactam (ZOSYN) IVPB 3.375 g  Status:  Discontinued     3.375 g 12.5 mL/hr over 240 Minutes Intravenous Every 8 hours 09/16/18 1655 09/18/18 1022   09/16/18 1100  vancomycin (VANCOCIN) IVPB 1000 mg/200 mL premix     1,000 mg 200 mL/hr over 60 Minutes Intravenous  Once 09/16/18 1049 09/16/18  1320   09/16/18 1100  piperacillin-tazobactam (ZOSYN) IVPB 3.375 g     3.375 g 12.5 mL/hr over 240 Minutes Intravenous  Once 09/16/18 1049 09/16/18 1535      Subjective: Sleeping this AM  Objective: Vitals:   09/24/18 2023 09/25/18 0413 09/26/18 0012 09/26/18 1300  BP: (!) 80/50 (!) 80/48 (!) 86/43 (!) 82/48  Pulse: 72 78 (!) 103 (!) 110  Resp: 14 16 (!) 9 12  Temp: 97.7 F (36.5 C) 97.9 F (36.6 C) 98.1 F (36.7 C) 98.2 F (36.8 C)  TempSrc: Axillary Axillary Oral Axillary  SpO2: 100% 92% 91% 90%  Weight:      Height:        Intake/Output Summary (Last 24 hours) at 09/26/2018 1632 Last data filed at 09/26/2018 1300 Gross per 24 hour  Intake 10 ml  Output 100 ml  Net -90 ml   Filed Weights   09/19/18 0500 09/20/18 1427 09/22/18 0609  Weight: 100.1 kg 99.5 kg 98.7 kg    Examination: General exam: Asleep, laying in bed, in nad Respiratory system: Normal respiratory effort, no wheezing  Data Reviewed: I have personally reviewed following labs and imaging studies  CBC: No results for input(s): WBC, NEUTROABS, HGB, HCT, MCV, PLT in the last 168 hours. Basic Metabolic Panel: Recent Labs  Lab 09/20/18 0323  NA 138  K 4.4  CL 105  CO2 15*  GLUCOSE 82  BUN 58*  CREATININE 4.67*  CALCIUM 7.6*   GFR: Estimated Creatinine Clearance: 15.6 mL/min (A) (by C-G formula based on SCr of 4.67 mg/dL (H)). Liver Function Tests: No results for input(s): AST, ALT, ALKPHOS, BILITOT, PROT, ALBUMIN in the last 168 hours. No results for input(s): LIPASE, AMYLASE in the last 168 hours. No results for input(s): AMMONIA in the last 168 hours. Coagulation Profile: No results for input(s): INR, PROTIME in the last 168 hours. Cardiac Enzymes: No results for input(s): CKTOTAL, CKMB, CKMBINDEX, TROPONINI in the last 168 hours. BNP (last 3 results) No results for input(s): PROBNP in the last 8760 hours. HbA1C: No results for input(s): HGBA1C in the last 72 hours. CBG: No results for  input(s): GLUCAP in the last 168 hours. Lipid Profile: No results for input(s): CHOL, HDL, LDLCALC, TRIG, CHOLHDL, LDLDIRECT in the last 72 hours. Thyroid Function Tests: No results for input(s): TSH, T4TOTAL, FREET4, T3FREE, THYROIDAB in the last 72 hours. Anemia Panel: No results for input(s): VITAMINB12, FOLATE, FERRITIN, TIBC, IRON, RETICCTPCT in the last 72 hours. Sepsis Labs: No results for input(s): PROCALCITON, LATICACIDVEN in the last 168 hours.  No results found for this or any previous visit (from the past 240 hour(s)).   Radiology Studies: No results found.  Scheduled Meds: . mouth rinse  15 mL Mouth Rinse BID  . sodium chloride flush  10-40 mL Intracatheter Q12H  . sodium chloride flush  3 mL Intravenous Q12H   Continuous Infusions: . sodium chloride 250 mL (09/22/18 1008)     LOS: 10 days   Marylu Lund, MD [facility] Pager On Amion  If 7PM-7AM, please contact night-coverage 09/26/2018, 4:32 PM

## 2018-09-26 NOTE — Progress Notes (Signed)
[facility] room is available for Mr. Gustafson tomorrow. Forms has been emailed to [place] for completion and return to [employer]. Explained charges and physician coverage to [place]. She is requesting Dr. Tomasa Hosteller to assume care. She is gathering financial information this evening. [facility] Liaison will update CSW tomorrow re status of transfer. Made Caryl Pina aware this afternoon.   Please contact Farrel Gordon or Venia Carbon related to Mr. Seiden tomorrow.  Their numbers are listed on AMION.   Thank you,  Erling Conte, LCSW [phone]

## 2018-09-27 NOTE — Progress Notes (Signed)
[facility]  Registration paperwork for [facility] has been completed.  [facility] is ready for [name] to transfer.   Please fax discharge summary to [phone].   RN staff:  please call report to [phone].   Thank you, Venia Carbon BSN, [facility] Liaison (listed in [place]) [phone]

## 2018-09-27 NOTE — Discharge Summary (Signed)
Discharge Summary  Gregory Chaney QQV:[id number] DOB: 12-20-1940  PCP: [name], NP  Admit date: 09/16/2018 Discharge date: 09/27/2018  Time spent: 35 minutes  Recommendations for Outpatient Follow-up:  1. Follow-up with hospice and palliative care [place] at [facility].  Discharge Diagnoses:  Active Hospital Problems   Diagnosis Date Noted  . AKI (acute kidney injury) ([place])   . Palliative care by specialist   . Goals of care, counseling/discussion   . Lactic acidosis 09/16/2018  . Dysphagia 09/16/2018  . Colonic mass 09/16/2018  . 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 Elevated troponin 09/16/2018  . Acute renal failure ([place]) 09/16/2018  . Hyperbilirubinemia 09/16/2018  . 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 Bone metastases ([place]) 10/26/2016    Resolved Hospital Problems  No resolved problems to display.     Diet recommendation: Pleasure feeding as tolerated  Vitals:   09/26/18 1300 09/27/18 0616  BP: (!) 82/48 110/70  Pulse: (!) 110 87  Resp: 12 12  Temp: 98.2 F (36.8 C) 97.7 F (36.5 C)  SpO2: 90% 90%    History of present illness:  78yo presented with shock with metastatic prostate cancer and new findings concerning for metastatic colon cancer. Patient has been steadily declining, now DNR and full comfort care.  09/27/2018: Patient seen and examined at his bedside.  Does not appear in distress.  Patient has a bed at [facility] with hospice care.    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 Septic shock 1. Suspected septic shock from  unclear source 2. Required aggressive IVF hydration and pressor support 3. Now DNR and for comfort care.   4. Completed one week course of abx 2. Lactic acidosis 1. Likely secondary to septic shock 3. Acute metabolic encephalopathy 1. Suspect component of worsening uremia 4. Metastatic prostate and colon cancer 1. Likely not candidate for chemo or aggressive tx 2. Continue supportive care  5. Anemia of chronic disease 1. No evidence of acute bleed 2. Cont with supportive care 6. ARF 1. Worsening renal function noted despite maximal therapy 7. End of life 1. See previous note. Daughter from [facility] arrived and updated. Wishes noted for DNR. Daughter states pt's wishes would be for focus to "maintain his dignity" and for comfort.   Code Status: DNR-full comfort care.  Consultants:   PCCM  Palliative Care   Discharge Exam: BP 110/70 (BP Location: Right Arm)   Pulse 87   Temp 97.7 F (36.5 C) (Oral)   Resp 12   Ht 5\' 9"  (1.753 m)   Wt 98.5 kg   SpO2 90%   BMI 32.07 kg/m  . General: 78 y.o. year-old male well developed well nourished in no acute distress.  Somnolent. . Cardiovascular: Regular rate and rhythm with no rubs or gallops.  No thyromegaly or JVD noted.   Marland Kitchen Respiratory: Mild Rales at bases with no wheezes. Good inspiratory effort. . Abdomen: Soft nontender nondistended with hypoactive bowel sounds x4 quadrants. . Psychiatry: Mood is appropriate for condition and setting  Discharge Instructions You were cared for by a hospitalist during your hospital  stay. If you have any questions about your discharge medications or the care you received while you were in the hospital after you are discharged, you can call the unit and asked to speak with the hospitalist on call if the hospitalist that took care of you is not available. Once you are discharged, your primary care physician will handle any further medical issues. Please note that NO REFILLS for any discharge medications  will be authorized once you are discharged, as it is imperative that you return to your primary care physician (or establish a relationship with a primary care physician if you do not have one) for your aftercare needs so that they can reassess your need for medications and monitor your lab values.   Allergies as of 09/27/2018      Reactions   Cephalexin Other (See Comments)   hallucinations   Hydrocodone Other (See Comments)   Confusion,my mind doesn't work well   Morphine Other (See Comments)    Confusion. My mind does not work right   Herbalist Hcl] Other (See Comments)   My mind doesn't work well   Percocet [oxycodone-acetaminophen] Nausea Only      Medication List    STOP taking these medications   pantoprazole 40 MG tablet Commonly known as:  PROTONIX      Allergies  Allergen Reactions  . Cephalexin Other (See Comments)    hallucinations  . Hydrocodone Other (See Comments)    Confusion,my mind doesn't work well   . Morphine Other (See Comments)     Confusion. My mind does not work right  . Flomax [Tamsulosin Hcl] Other (See Comments)    My mind doesn't work well  . Percocet [Oxycodone-Acetaminophen] Nausea Only      The results of significant diagnostics from this hospitalization (including imaging, microbiology, ancillary and laboratory) are listed below for reference.    Significant Diagnostic Studies: Ct Abdomen Pelvis Wo Contrast  Result Date: 09/16/2018 CLINICAL DATA:  Abdominal distention. Metastatic prostate cancer. EXAM: CT ABDOMEN AND PELVIS WITHOUT CONTRAST TECHNIQUE: Multidetector CT imaging of the abdomen and pelvis was performed following the standard protocol without IV contrast. COMPARISON:  CT scan dated 03/09/2017 FINDINGS: Lower chest: On image number 1 there is an enlarged subcarinal lymph node, 18 mm in diameter. Heart size is normal. Minimal pericardial effusion. Minimal linear scarring at the lung bases. No effusions. Hepatobiliary: New  extensive metastatic disease throughout the liver with new hepatomegaly due to the metastatic disease. Cholelithiasis. No dilated bile ducts. Pancreas: Unremarkable. No pancreatic ductal dilatation or surrounding inflammatory changes. Spleen: New ill-defined 3.2 cm lesion in the posterior aspect of the spleen. Adrenals/Urinary Tract: Adrenal glands are normal. No hydronephrosis. 16 mm partially calcified lesion in the mid right kidney which appears new since the prior study. Massive enlargement of the prostate gland extending into the bladder. Prostate gland measures approximately 8 cm in diameter and fills most of the bladder. Stomach/Bowel: There is a lobulated annular mass involving the mid sigmoid portion of the colon best seen on image 61 of series 2. No obstruction. Moderate hiatal hernia. Small bowel appears normal. Appendix is normal. Vascular/Lymphatic: Aortic atherosclerosis. No discrete adenopathy. Reproductive: Massive enlargement of the prostate gland filling most of the bladder. Other: There is a small amount of ascites adjacent to the right lobe of the liver and in the right pericolic gutter extending into the right side of the pelvis. Anasarca. Musculoskeletal: There are extensive blastic and lytic lesions throughout the visualized portion of the skeleton  consistent with metastatic prostate cancer. The overall appearance of the metastatic disease is significantly improved since the prior study. IMPRESSION: 1. New extensive metastatic disease throughout the liver creating hepatomegaly. New ill-defined 3.2 cm lesion in the posterior aspect of the spleen. New subcarinal adenopathy. 2. New annular mass in the mid sigmoid portion of the colon without obstruction. 3. Massive enlargement of the prostate gland. 4. Extensive blastic and lytic metastatic disease throughout the visualized portion of the skeleton from a previously demonstrated metastatic prostate cancer, significantly improved since the prior  study. 5. 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 Alternatively, this could represent atypical metastatic prostate cancer. Electronically Signed   By: Lorriane Shire M.D.   On: 09/16/2018 13:00   Dg Chest 2 View  Result Date: 09/16/2018 CLINICAL DATA:  "getting weaker and weaker". He has had a recent chemo. treatment for prostate cancer. He reported he had fallen at home, but has no injuries. He stated he was too weak to get up after the fall and he lied on the floor for approximately 2 hours until he was able to contact someone EXAM: CHEST - 2 VIEW COMPARISON:  none FINDINGS: Lungs are clear. Heart size and mediastinal contours are within normal limits. No effusion. Visualized bones unremarkable. IMPRESSION: No acute cardiopulmonary disease. Electronically Signed   By: Lucrezia Europe M.D.   On: 09/16/2018 11:33   Dg Chest 2 View  Result Date: 09/05/2018 CLINICAL DATA:  Dysphagia. EXAM: CHEST - 2 VIEW COMPARISON:  March 09, 2017 FINDINGS: The heart size and mediastinal contours are within normal limits. Both lungs are clear. The visualized skeletal structures are unremarkable. IMPRESSION: No active cardiopulmonary disease. Electronically Signed   By: Dorise Bullion III M.D   On: 09/05/2018 16:56   Ct Head Wo Contrast  Result Date: 09/16/2018 CLINICAL DATA:  78 year old with head trauma. Receiving treatment for prostate cancer. Recent fall. EXAM: CT HEAD WITHOUT CONTRAST TECHNIQUE: Contiguous axial images were obtained from the base of the skull through the vertex without intravenous contrast. COMPARISON:  None. FINDINGS: Brain: No evidence for acute hemorrhage, mass lesion, midline shift, hydrocephalus or large infarct. Small amount of low density in the periventricular white matter. Mild cerebral atrophy. Vascular: No hyperdense vessel or unexpected calcification. Skull: Negative for fracture. Focal  lucency involving the medial right mandibular condyle is nonspecific. No evidence for bone destruction involving the visualized right mandibular condyle. Sinuses/Orbits: No acute finding. Other: None. IMPRESSION: 1. No acute intracranial abnormality. 2. Mild atrophy. Mild white matter disease suggestive for chronic small vessel ischemic changes. Electronically Signed   By: Markus Daft M.D.   On: 09/16/2018 12:59   Ct L-spine No Charge  Result Date: 09/16/2018 CLINICAL DATA:  Pain secondary to a fall. Metastatic prostate cancer. Weakness. EXAM: CT LUMBAR SPINE WITHOUT CONTRAST TECHNIQUE: Multidetector CT imaging of the lumbar spine was performed without intravenous contrast administration. Multiplanar CT image reconstructions were also generated. COMPARISON:  CT scan dated 03/09/2017 FINDINGS: Segmentation: 5 lumbar type vertebrae. Alignment: Normal. Vertebrae: Diffuse metastatic disease throughout the entire visualized portion of the spine. There are multiple lytic and blastic lesions in the spine. However, there has been improvement in the lytic lesions since the prior CT scan of 03/09/2017. There is an old pathologic fracture of the right side of the T12 vertebral body. No discrete definable tumor within the spinal canal. There blastic lesions in the visualized portions of  the iliac bones. Paraspinal and other soft tissues: Negative. Disc levels: T11-12: Disc space narrowing. No disc bulging or protrusion. T12-L1: Disc space narrowing. No disc bulging or protrusion. L1-2: No disc bulging or protrusion. L2-3: Slight protrusion of the posterior inferior aspect of the L2 vertebral endplate into the spinal canal with only minimal indentation upon the thecal sac. No focal neural impingement. L3-4: Tiny broad-based disc bulge with slight narrowing of the spinal canal symmetrically. L4-5: Broad-based disc protrusion. Hypertrophy of the ligamentum flavum and facet joints combine to create severe spinal stenosis.  Moderately severe bilateral facet arthritis. L5-S1: Small broad-based disc bulge. Severe bilateral facet arthritis and ligamentum flavum hypertrophy with moderate to severe spinal stenosis. IMPRESSION: 1. No acute abnormality of the lumbar spine. 2. Extensive blastic and lytic metastatic disease throughout the visualized portion of the spine and pelvic bones, improved slightly since the prior study of 03/09/2017. 3. 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 4. Moderate to severe spinal stenosis at L5-S1. Electronically Signed   By: Lorriane Shire M.D.   On: 09/16/2018 12:48   Dg Chest Port 1 View  Result Date: 09/17/2018 CLINICAL DATA:  78 year old with metastatic prostate cancer to bone, presenting with generalized weakness, weight loss, dysphagia and a fall at home yesterday. CT abdomen and pelvis yesterday demonstrated new metastatic disease involving the liver, spleen and subcarinal lymph nodes and and mid sigmoid colon mass. EXAM: PORTABLE CHEST 1 VIEW COMPARISON:  09/16/2018 and earlier. FINDINGS: Markedly suboptimal inspiration. Cardiac silhouette mildly to moderately enlarged. Atelectasis involving the lung bases. Lungs otherwise clear. Mild pulmonary venous hypertension without overt edema. IMPRESSION: 1. Markedly suboptimal inspiration accounts for bibasilar atelectasis. No acute cardiopulmonary disease otherwise. 2. Cardiomegaly. Pulmonary venous hypertension without overt edema. Electronically Signed   By: Evangeline Dakin M.D.   On: 09/17/2018 19:39   [place] Ekg Site Rite  Result Date: 09/19/2018 If Site Rite image not attached, placement could not be confirmed due to current cardiac rhythm.  US Abdomen Limited Ruq  Result Date: 09/16/2018 CLINICAL DATA:  Acute RIGHT UPPER QUADRANT abdominal pain. EXAM: ULTRASOUND ABDOMEN LIMITED RIGHT UPPER QUADRANT COMPARISON:  CT abdomen and pelvis performed earlier same day and on 03/09/2017 are correlated. FINDINGS: Gallbladder: Shadowing gallstones within the  contracted gallbladder, the largest measuring approximately 1.1 cm. No gallbladder wall thickening or pericholecystic fluid. Negative sonographic Murphy's sign according to the ultrasound technologist. Common bile duct: Diameter: Approximately 8 mm centrally. Obscured distally by duodenal bowel gas. No visible bile duct stone. Liver: Multiple heterogeneous masses of mixed echotexture, indicating innumerable liver metastases as noted on the earlier CT. Portal vein is patent on color Doppler imaging with normal direction of blood flow towards the liver. IMPRESSION: 1. Cholelithiasis without sonographic evidence of cholecystitis. 2. No biliary ductal dilation. 3. Innumerable liver metastases as noted on the CT earlier same day. Electronically Signed   By: Evangeline Dakin M.D.   On: 09/16/2018 13:29    Microbiology: No results found for this or any previous visit (from the past 240 hour(s)).   Labs: Basic Metabolic Panel: No results for input(s): NA, K, CL, CO2, GLUCOSE, BUN, CREATININE, CALCIUM, MG, PHOS in the last 168 hours. Liver Function Tests: No results for input(s): AST, ALT, ALKPHOS, BILITOT, PROT, ALBUMIN in the last 168 hours. No results for input(s): LIPASE, AMYLASE in the last 168 hours. No results for input(s): AMMONIA in the last 168 hours. CBC: No results for input(s): WBC, NEUTROABS, HGB, HCT, MCV, PLT in the last 168 hours. Cardiac Enzymes: No  results for input(s): CKTOTAL, CKMB, CKMBINDEX, TROPONINI in the last 168 hours. BNP: BNP (last 3 results) No results for input(s): BNP in the last 8760 hours.  ProBNP (last 3 results) No results for input(s): PROBNP in the last 8760 hours.  CBG: No results for input(s): GLUCAP in the last 168 hours.     Signed:  Kayleen Memos, MD [facility] 09/27/2018, 12:25 PM

## 2018-09-27 NOTE — Progress Notes (Signed)
Patient able to go to discharge to [facility] today. CSW notified daughter, Blair Dolphin.Number for report is in [facility] progress note. Please read for additional information.   Kingsley Spittle, [place]  [phone]

## 2018-09-27 NOTE — Progress Notes (Signed)
Called report to Arbie Cookey ,Therapist, sports at [facility].

## 2018-09-28 ENCOUNTER — Inpatient Hospital Stay (HOSPITAL_COMMUNITY): Admission: RE | Admit: 2018-09-28 | Payer: Medicare PPO | Source: Ambulatory Visit

## 2018-10-21 DEATH — deceased

## 2018-11-03 ENCOUNTER — Encounter: Payer: Self-pay | Admitting: Oncology
